# Patient Record
Sex: Female | Born: 1950
Health system: Southern US, Community
[De-identification: ages and names within clinical notes are randomized; demographics above are authoritative.]

## PROBLEM LIST (undated history)

## (undated) DIAGNOSIS — G709 Myoneural disorder, unspecified: Secondary | ICD-10-CM

## (undated) DIAGNOSIS — I10 Essential (primary) hypertension: Secondary | ICD-10-CM

## (undated) DIAGNOSIS — M797 Fibromyalgia: Secondary | ICD-10-CM

## (undated) DIAGNOSIS — N1832 Chronic kidney disease, stage 3b: Secondary | ICD-10-CM

## (undated) DIAGNOSIS — G4730 Sleep apnea, unspecified: Secondary | ICD-10-CM

## (undated) DIAGNOSIS — M1990 Unspecified osteoarthritis, unspecified site: Secondary | ICD-10-CM

## (undated) DIAGNOSIS — IMO0001 Reserved for inherently not codable concepts without codable children: Secondary | ICD-10-CM

## (undated) DIAGNOSIS — I2510 Atherosclerotic heart disease of native coronary artery without angina pectoris: Secondary | ICD-10-CM

## (undated) DIAGNOSIS — E119 Type 2 diabetes mellitus without complications: Secondary | ICD-10-CM

## (undated) DIAGNOSIS — Z531 Procedure and treatment not carried out because of patient's decision for reasons of belief and group pressure: Secondary | ICD-10-CM

## (undated) DIAGNOSIS — T8859XA Other complications of anesthesia, initial encounter: Secondary | ICD-10-CM

## (undated) DIAGNOSIS — I739 Peripheral vascular disease, unspecified: Secondary | ICD-10-CM

## (undated) DIAGNOSIS — E785 Hyperlipidemia, unspecified: Secondary | ICD-10-CM

## (undated) HISTORY — PX: ABLATION: SHX5711

## (undated) HISTORY — DX: Essential (primary) hypertension: I10

## (undated) HISTORY — DX: Hyperlipidemia, unspecified: E78.5

## (undated) HISTORY — DX: Unspecified osteoarthritis, unspecified site: M19.90

## (undated) HISTORY — DX: Type 2 diabetes mellitus without complications: E11.9

## (undated) HISTORY — DX: Fibromyalgia: M79.7

## (undated) HISTORY — PX: ROTATOR CUFF REPAIR: SHX139

## (undated) HISTORY — PX: HYSTEROTOMY: SHX1776

## (undated) HISTORY — PX: CAROTID STENT: SHX1301

## (undated) HISTORY — PX: CHOLECYSTECTOMY: SHX55

---

## 2020-01-28 DIAGNOSIS — E1159 Type 2 diabetes mellitus with other circulatory complications: Secondary | ICD-10-CM | POA: Diagnosis not present

## 2020-01-28 DIAGNOSIS — I2510 Atherosclerotic heart disease of native coronary artery without angina pectoris: Secondary | ICD-10-CM | POA: Diagnosis not present

## 2020-01-28 DIAGNOSIS — E7800 Pure hypercholesterolemia, unspecified: Secondary | ICD-10-CM | POA: Diagnosis not present

## 2020-01-28 DIAGNOSIS — G2581 Restless legs syndrome: Secondary | ICD-10-CM | POA: Diagnosis not present

## 2020-04-11 DIAGNOSIS — H0100A Unspecified blepharitis right eye, upper and lower eyelids: Secondary | ICD-10-CM | POA: Diagnosis not present

## 2020-04-11 DIAGNOSIS — H0100B Unspecified blepharitis left eye, upper and lower eyelids: Secondary | ICD-10-CM | POA: Diagnosis not present

## 2020-04-14 ENCOUNTER — Ambulatory Visit: Payer: Self-pay | Admitting: Internal Medicine

## 2020-04-19 DIAGNOSIS — H0100A Unspecified blepharitis right eye, upper and lower eyelids: Secondary | ICD-10-CM | POA: Diagnosis not present

## 2020-04-19 DIAGNOSIS — H0100B Unspecified blepharitis left eye, upper and lower eyelids: Secondary | ICD-10-CM | POA: Diagnosis not present

## 2020-04-20 DIAGNOSIS — I739 Peripheral vascular disease, unspecified: Secondary | ICD-10-CM | POA: Diagnosis not present

## 2020-04-20 DIAGNOSIS — M21962 Unspecified acquired deformity of left lower leg: Secondary | ICD-10-CM | POA: Diagnosis not present

## 2020-04-20 DIAGNOSIS — L602 Onychogryphosis: Secondary | ICD-10-CM | POA: Diagnosis not present

## 2020-04-20 DIAGNOSIS — M21961 Unspecified acquired deformity of right lower leg: Secondary | ICD-10-CM | POA: Diagnosis not present

## 2020-04-20 DIAGNOSIS — E1351 Other specified diabetes mellitus with diabetic peripheral angiopathy without gangrene: Secondary | ICD-10-CM | POA: Diagnosis not present

## 2020-04-20 DIAGNOSIS — M205X1 Other deformities of toe(s) (acquired), right foot: Secondary | ICD-10-CM | POA: Diagnosis not present

## 2020-04-20 DIAGNOSIS — L84 Corns and callosities: Secondary | ICD-10-CM | POA: Diagnosis not present

## 2020-04-26 DIAGNOSIS — I739 Peripheral vascular disease, unspecified: Secondary | ICD-10-CM | POA: Diagnosis not present

## 2020-05-26 DIAGNOSIS — I739 Peripheral vascular disease, unspecified: Secondary | ICD-10-CM | POA: Diagnosis not present

## 2020-06-02 DIAGNOSIS — E119 Type 2 diabetes mellitus without complications: Secondary | ICD-10-CM | POA: Diagnosis not present

## 2020-06-02 DIAGNOSIS — L918 Other hypertrophic disorders of the skin: Secondary | ICD-10-CM | POA: Diagnosis not present

## 2020-06-02 DIAGNOSIS — Z794 Long term (current) use of insulin: Secondary | ICD-10-CM | POA: Diagnosis not present

## 2020-06-02 DIAGNOSIS — H25813 Combined forms of age-related cataract, bilateral: Secondary | ICD-10-CM | POA: Diagnosis not present

## 2020-06-07 DIAGNOSIS — M21962 Unspecified acquired deformity of left lower leg: Secondary | ICD-10-CM | POA: Diagnosis not present

## 2020-06-07 DIAGNOSIS — I739 Peripheral vascular disease, unspecified: Secondary | ICD-10-CM | POA: Diagnosis not present

## 2020-06-07 DIAGNOSIS — M21961 Unspecified acquired deformity of right lower leg: Secondary | ICD-10-CM | POA: Diagnosis not present

## 2020-06-07 DIAGNOSIS — E1351 Other specified diabetes mellitus with diabetic peripheral angiopathy without gangrene: Secondary | ICD-10-CM | POA: Diagnosis not present

## 2020-06-17 DIAGNOSIS — E1159 Type 2 diabetes mellitus with other circulatory complications: Secondary | ICD-10-CM | POA: Diagnosis not present

## 2020-06-17 DIAGNOSIS — Z79899 Other long term (current) drug therapy: Secondary | ICD-10-CM | POA: Diagnosis not present

## 2020-06-17 DIAGNOSIS — E7800 Pure hypercholesterolemia, unspecified: Secondary | ICD-10-CM | POA: Diagnosis not present

## 2020-06-23 DIAGNOSIS — E1159 Type 2 diabetes mellitus with other circulatory complications: Secondary | ICD-10-CM | POA: Diagnosis not present

## 2020-06-23 DIAGNOSIS — E1140 Type 2 diabetes mellitus with diabetic neuropathy, unspecified: Secondary | ICD-10-CM | POA: Diagnosis not present

## 2020-06-23 DIAGNOSIS — E7800 Pure hypercholesterolemia, unspecified: Secondary | ICD-10-CM | POA: Diagnosis not present

## 2020-06-23 DIAGNOSIS — E1129 Type 2 diabetes mellitus with other diabetic kidney complication: Secondary | ICD-10-CM | POA: Diagnosis not present

## 2020-07-06 DIAGNOSIS — Z961 Presence of intraocular lens: Secondary | ICD-10-CM | POA: Diagnosis not present

## 2020-07-06 DIAGNOSIS — L918 Other hypertrophic disorders of the skin: Secondary | ICD-10-CM | POA: Diagnosis not present

## 2020-07-06 DIAGNOSIS — H2512 Age-related nuclear cataract, left eye: Secondary | ICD-10-CM | POA: Diagnosis not present

## 2020-07-06 DIAGNOSIS — H25811 Combined forms of age-related cataract, right eye: Secondary | ICD-10-CM | POA: Diagnosis not present

## 2020-07-06 DIAGNOSIS — H25012 Cortical age-related cataract, left eye: Secondary | ICD-10-CM | POA: Diagnosis not present

## 2020-07-06 DIAGNOSIS — Z48810 Encounter for surgical aftercare following surgery on the sense organs: Secondary | ICD-10-CM | POA: Diagnosis not present

## 2020-07-06 DIAGNOSIS — Z9842 Cataract extraction status, left eye: Secondary | ICD-10-CM | POA: Diagnosis not present

## 2020-07-13 DIAGNOSIS — H2511 Age-related nuclear cataract, right eye: Secondary | ICD-10-CM | POA: Diagnosis not present

## 2020-07-13 DIAGNOSIS — H25011 Cortical age-related cataract, right eye: Secondary | ICD-10-CM | POA: Diagnosis not present

## 2020-07-19 DIAGNOSIS — E1159 Type 2 diabetes mellitus with other circulatory complications: Secondary | ICD-10-CM | POA: Diagnosis not present

## 2020-07-24 NOTE — Progress Notes (Signed)
Patient referred by [name], DO for coronary artery disease  Subjective:   Suzanne Patton, female    DOB: Feb 19, 1951, 70 y.o.   MRN: [id number]   Chief Complaint  Patient presents with  . Coronary Artery Disease  . New Patient (Initial Visit)     HPI  70 y.o. Caucasian female with hypertension, hyperlipidemia, type 2 DM, CAD, SVT, bipolar disorder, OSA on CPAP, morbid obesity  Patient has known CAD with PCI in 2012, has h/o SVT with ablations, all performed in [place].   Patient moved to [place] in July 2020 to be with her daughter.  Most recently, she lived in [place], although she has previously lived in several different states.  Her last visit with her cardiologist in [place] was in September 2020.  Patient has had left-sided chest pressure symptoms with physical and emotional stress, relieved with stress, for long time.  This had been stable, but increased this past winter.  Patient has 2-3 episodes a week.  She is not on any short or long-acting nitroglycerin.  Patient reports episodes of lightheadedness followed by syncope, most recent episode 3 weeks ago.  She has several other episodes of aborted syncope.  She denies any chest pain or shortness of breath prior to her syncope episode.  She is reportedly undergone extensive neurological work-up in [place], which was reportedly unyielding.  Her diabetes remains uncontrolled.  She is now established care with Dr. [name], with whom she is working actively on management of her diabetes.   Past Medical History:  Diagnosis Date  . Diabetes mellitus without complication ([facility])   . Hyperlipidemia   . Hypertension      Past Surgical History:  Procedure Laterality Date  . ABLATION    . CAROTID STENT    . CHOLECYSTECTOMY    . HYSTEROTOMY    . ROTATOR CUFF REPAIR Right      Social History   Tobacco Use  Smoking Status Never Smoker  Smokeless Tobacco Never Used    Social History   Substance and Sexual  Activity  Alcohol Use Yes   Comment: rare     Family History  Problem Relation Age of Onset  . Atrial fibrillation Mother   . Kidney failure Mother   . Heart disease Father   . Heart attack Father   . Heart failure Father   . COPD Father      Current Outpatient Medications on File Prior to Visit  Medication Sig Dispense Refill  . amLODipine (NORVASC) 5 MG tablet Take 1 tablet by mouth daily.    Marland Kitchen aspirin 81 MG EC tablet Take 1 tablet by mouth daily.    Marland Kitchen atorvastatin (LIPITOR) 20 MG tablet Take 1 tablet by mouth daily.    . citalopram (CELEXA) 20 MG tablet Take 20 mg by mouth daily.    . clonazePAM (KLONOPIN) 0.5 MG tablet Take 0.75 mg by mouth at bedtime.    Marland Kitchen ezetimibe (ZETIA) 10 MG tablet Take 10 mg by mouth daily.    . fluticasone (FLONASE) 50 MCG/ACT nasal spray as needed.    Marland Kitchen ibuprofen (ADVIL) 600 MG tablet as needed.    . insulin lispro (HUMALOG) 100 UNIT/ML injection as needed.    . labetalol (NORMODYNE) 300 MG tablet Take 300 mg by mouth 2 (two) times daily.    Marland Kitchen lamoTRIgine (LAMICTAL) 200 MG tablet Take 200 mg by mouth 2 (two) times daily.    Marland Kitchen LEVEMIR 100 UNIT/ML injection 55 Units daily.    Marland Kitchen  ofloxacin (OCUFLOX) 0.3 % ophthalmic solution 1 drop 3 (three) times daily.    Marland Kitchen omeprazole (PRILOSEC) 20 MG capsule Take 1 capsule by mouth daily.    . prednisoLONE acetate (PRED FORTE) 1 % ophthalmic suspension 1 drop 3 (three) times daily.    . Semaglutide (RYBELSUS) 3 MG TABS See admin instructions.    [facility] Cancer ophthalmic ointment SMARTSIG:0.5 Strip(s) In Eye(s) 3 Times Daily     No current facility-administered medications on file prior to visit.    Cardiovascular and other pertinent studies:  EKG 07/25/2020: Sinus rhythm 66 bpm Poor R-wave progression, otherwise normal EKG   Recent labs: 06/17/2020: Glucose 259, BUN/Cr 19/1.3. EGFR 44. Na/K 141/5.2. AlKP 132. Rest of the CMP normal H/H 13/41. MCV 87. Platelets 250 HbA1C 8.0% Chol 268, TG 181, HDL 81, LDL  155 TSH 1.5 normal    Review of Systems  Cardiovascular: Positive for chest pain, dyspnea on exertion and syncope. Negative for leg swelling and palpitations.  Neurological: Positive for light-headedness.         Vitals:   07/25/20 1052 07/25/20 1057  BP: (!) 149/84 (!) 150/80  Pulse: 68 67  Resp: 17   Temp: 98 F (36.7 C)   SpO2: 96%    Orthostatic VS for the past 72 hrs (Last 3 readings):  Orthostatic BP Patient Position BP Location Cuff Size Orthostatic Pulse  07/25/20 1136 119/65 Standing Left Arm Large 71  07/25/20 1135 136/74 Sitting Left Arm Large 65  07/25/20 1132 152/75 Supine Left Arm Large 63     Body mass index is 24.94 kg/m. Filed Weights   07/25/20 1052  Weight: 164 lb (74.4 kg)     Objective:   Physical Exam Vitals and nursing note reviewed.  Constitutional:      General: She is not in acute distress. Neck:     Vascular: No JVD.  Cardiovascular:     Rate and Rhythm: Normal rate and regular rhythm.     Heart sounds: Normal heart sounds. No murmur heard.   Pulmonary:     Effort: Pulmonary effort is normal.     Breath sounds: Normal breath sounds. No wheezing or rales.  Musculoskeletal:     Right lower leg: No edema.     Left lower leg: No edema.         Assessment & Recommendations:   70 y.o. Caucasian female with hypertension, hyperlipidemia, type 2 DM, CAD, SVT, bipolar disorder, OSA on CPAP, morbid obesity  Coronary artery disease with stable angina: Recent increase in angina symptoms. She is currently on labetalol 300 mg twice daily, and amlodipine 5 mg daily.  Her GFR is 44. I am reluctant to increase amlodipine dose given her significant orthostatic drop in blood pressure, and episodes of syncope. I am also reluctant to start her on Ranexa given her GFR of 44. Unable to use long-acting nitroglycerin given episodes of lightheadedness and syncope. At this time, I recommend using sublingual nitroglycerin for as needed use.   I  will also change amlodipine 5 mg daily to diltiazem 140 mg daily. I will reduce her labetalol to half tablet of 300 mg twice daily, and add losartan 25 mg instead. She will check BMP in 1 week with her PCP. I will also check exercise nuclear stress test and echocardiogram. I will obtain her most recent cardiology records from [place].  Syncope: I suspect this is most likely related to orthostatic hypotension. Recommend 2-week cardiac telemetry. Avoid driving for 6 months since last episode  of syncope.  Further recommendations after above testing.  Thank you for referring the patient to [place]. Please feel free to contact with any questions.   Nigel Mormon, MD Pager: [phone] Office: [phone]

## 2020-07-25 ENCOUNTER — Encounter: Payer: Self-pay | Admitting: Cardiology

## 2020-07-25 ENCOUNTER — Other Ambulatory Visit: Payer: Self-pay

## 2020-07-25 ENCOUNTER — Ambulatory Visit: Payer: Medicare Other | Admitting: Cardiology

## 2020-07-25 ENCOUNTER — Other Ambulatory Visit: Payer: Medicare Other

## 2020-07-25 VITALS — BP 150/80 | HR 67 | Temp 98.0°F | Resp 17 | Ht 68.0 in | Wt 164.0 lb

## 2020-07-25 DIAGNOSIS — E782 Mixed hyperlipidemia: Secondary | ICD-10-CM | POA: Insufficient documentation

## 2020-07-25 DIAGNOSIS — I10 Essential (primary) hypertension: Secondary | ICD-10-CM

## 2020-07-25 DIAGNOSIS — R55 Syncope and collapse: Secondary | ICD-10-CM

## 2020-07-25 DIAGNOSIS — I25118 Atherosclerotic heart disease of native coronary artery with other forms of angina pectoris: Secondary | ICD-10-CM

## 2020-07-25 DIAGNOSIS — I2510 Atherosclerotic heart disease of native coronary artery without angina pectoris: Secondary | ICD-10-CM | POA: Insufficient documentation

## 2020-07-25 MED ORDER — ROSUVASTATIN CALCIUM 20 MG PO TABS: 20.0000 mg | ORAL_TABLET | Freq: Every day | ORAL | 3 refills | Status: DC

## 2020-07-25 MED ORDER — NITROGLYCERIN 0.4 MG SL SUBL
0.4000 mg | SUBLINGUAL_TABLET | SUBLINGUAL | 3 refills | Status: AC | PRN
Start: 2020-07-25 — End: 2022-08-10

## 2020-07-25 MED ORDER — DILTIAZEM HCL ER COATED BEADS 120 MG PO CP24: 120.0000 mg | ORAL_CAPSULE | Freq: Every day | ORAL | 3 refills | Status: DC

## 2020-07-25 MED ORDER — LOSARTAN POTASSIUM 25 MG PO TABS: 25.0000 mg | ORAL_TABLET | Freq: Every day | ORAL | 3 refills | Status: DC

## 2020-07-26 ENCOUNTER — Other Ambulatory Visit: Payer: Self-pay

## 2020-07-26 DIAGNOSIS — R55 Syncope and collapse: Secondary | ICD-10-CM

## 2020-07-28 DIAGNOSIS — R55 Syncope and collapse: Secondary | ICD-10-CM | POA: Diagnosis not present

## 2020-07-29 ENCOUNTER — Telehealth: Payer: Self-pay

## 2020-07-29 NOTE — Telephone Encounter (Signed)
Called pt no answer, left a vm

## 2020-07-29 NOTE — Telephone Encounter (Signed)
Okay about the monitor. As for eye surgery, defer to her surgeon as to if there is anything she should hold. Nothing from a cardiac perspective.

## 2020-08-01 NOTE — Telephone Encounter (Signed)
Pt is aware.  

## 2020-08-11 ENCOUNTER — Other Ambulatory Visit: Payer: Medicare Other

## 2020-08-22 ENCOUNTER — Other Ambulatory Visit: Payer: Medicare Other

## 2020-08-24 DIAGNOSIS — T8522XA Displacement of intraocular lens, initial encounter: Secondary | ICD-10-CM | POA: Diagnosis not present

## 2020-08-24 DIAGNOSIS — H27121 Anterior dislocation of lens, right eye: Secondary | ICD-10-CM | POA: Diagnosis not present

## 2020-08-29 ENCOUNTER — Ambulatory Visit: Payer: Medicare Other | Admitting: Cardiology

## 2020-08-29 DIAGNOSIS — R55 Syncope and collapse: Secondary | ICD-10-CM | POA: Diagnosis not present

## 2020-08-30 ENCOUNTER — Other Ambulatory Visit: Payer: Medicare Other

## 2020-09-01 DIAGNOSIS — E1159 Type 2 diabetes mellitus with other circulatory complications: Secondary | ICD-10-CM | POA: Diagnosis not present

## 2020-09-01 DIAGNOSIS — E7800 Pure hypercholesterolemia, unspecified: Secondary | ICD-10-CM | POA: Diagnosis not present

## 2020-09-01 DIAGNOSIS — I2510 Atherosclerotic heart disease of native coronary artery without angina pectoris: Secondary | ICD-10-CM | POA: Diagnosis not present

## 2020-09-01 DIAGNOSIS — E1129 Type 2 diabetes mellitus with other diabetic kidney complication: Secondary | ICD-10-CM | POA: Diagnosis not present

## 2020-09-05 ENCOUNTER — Other Ambulatory Visit: Payer: Self-pay

## 2020-09-05 ENCOUNTER — Ambulatory Visit: Payer: Medicare Other

## 2020-09-05 DIAGNOSIS — I25118 Atherosclerotic heart disease of native coronary artery with other forms of angina pectoris: Secondary | ICD-10-CM

## 2020-09-22 ENCOUNTER — Other Ambulatory Visit: Payer: Self-pay

## 2020-09-22 ENCOUNTER — Encounter: Payer: Self-pay | Admitting: Cardiology

## 2020-09-22 ENCOUNTER — Ambulatory Visit: Payer: Medicare Other | Admitting: Cardiology

## 2020-09-22 VITALS — BP 147/71 | HR 82 | Temp 96.1°F | Resp 16 | Ht 68.0 in | Wt 264.0 lb

## 2020-09-22 DIAGNOSIS — I25118 Atherosclerotic heart disease of native coronary artery with other forms of angina pectoris: Secondary | ICD-10-CM | POA: Diagnosis not present

## 2020-09-22 DIAGNOSIS — I10 Essential (primary) hypertension: Secondary | ICD-10-CM | POA: Diagnosis not present

## 2020-09-22 DIAGNOSIS — E782 Mixed hyperlipidemia: Secondary | ICD-10-CM | POA: Diagnosis not present

## 2020-09-22 DIAGNOSIS — R55 Syncope and collapse: Secondary | ICD-10-CM

## 2020-09-22 NOTE — Progress Notes (Signed)
Patient referred by [name], DO for coronary artery disease  Subjective:   Suzanne Patton, female    DOB: October 25, 1950, 70 y.o.   MRN: [id number]   Chief Complaint  Patient presents with   Chest Pain     HPI  70 y.o. Caucasian female with hypertension, hyperlipidemia, type 2 DM, CAD, SVT, bipolar disorder, OSA on CPAP, morbid obesity  Patient contracted COVID in May and has had episodes of hypoxia and dyspnea since then. She states that her sats read in 80s on her pulse oximeter at home. When this happens, she merely uses her CPAP machine, but does not have supplemental oxygen. She has not seen pulmonology. Chest pain episodes occur couple times a week, improved SL NTG.  Reviewed recent test results with the patient, details below.   Initial consultation HPI 07/2020: Patient has known CAD with PCI in 2012, has h/o SVT with ablations, all performed in [place].   Patient moved to [place] in July 2020 to be with her daughter.  Most recently, she lived in [place], although she has previously lived in several different states.  Her last visit with her cardiologist in [place] was in September 2020.  Patient has had left-sided chest pressure symptoms with physical and emotional stress, relieved with stress, for long time.  This had been stable, but increased this past winter.  Patient has 2-3 episodes a week.  She is not on any short or long-acting nitroglycerin.  Patient reports episodes of lightheadedness followed by syncope, most recent episode 3 weeks ago.  She has several other episodes of aborted syncope.  She denies any chest pain or shortness of breath prior to her syncope episode.  She is reportedly undergone extensive neurological work-up in [place], which was reportedly unyielding.  Her diabetes remains uncontrolled.  She is now established care with Dr. [name], with whom she is working actively on management of her diabetes.   Current Outpatient Medications on File  Prior to Visit  Medication Sig Dispense Refill   albuterol (VENTOLIN HFA) 108 (90 Base) MCG/ACT inhaler SMARTSIG:1-2 Puff(s) By Mouth 4 Times Daily PRN     aspirin 81 MG EC tablet Take 1 tablet by mouth daily.     citalopram (CELEXA) 20 MG tablet Take 20 mg by mouth daily.     clonazePAM (KLONOPIN) 0.5 MG tablet Take 0.75 mg by mouth at bedtime.     diltiazem (CARDIZEM CD) 120 MG 24 hr capsule Take 1 capsule (120 mg total) by mouth daily. 30 capsule 3   ezetimibe (ZETIA) 10 MG tablet Take 10 mg by mouth daily.     fluticasone (FLONASE) 50 MCG/ACT nasal spray as needed.     ibuprofen (ADVIL) 600 MG tablet as needed.     insulin lispro (HUMALOG) 100 UNIT/ML injection as needed.     labetalol (NORMODYNE) 300 MG tablet Take 150 mg by mouth 2 (two) times daily.     lamoTRIgine (LAMICTAL) 200 MG tablet Take 200 mg by mouth 2 (two) times daily.     LEVEMIR 100 UNIT/ML injection 55 Units daily.     losartan (COZAAR) 25 MG tablet Take 1 tablet (25 mg total) by mouth daily. 30 tablet 3   nitroGLYCERIN (NITROSTAT) 0.4 MG SL tablet Place 1 tablet (0.4 mg total) under the tongue every 5 (five) minutes as needed for chest pain. 30 tablet 3   omeprazole (PRILOSEC) 20 MG capsule Take 1 capsule by mouth daily.     rosuvastatin (CRESTOR) 20 MG  tablet Take 1 tablet (20 mg total) by mouth daily. 90 tablet 3   Semaglutide 7 MG TABS See admin instructions. RYBELSUS     No current facility-administered medications on file prior to visit.    Cardiovascular and other pertinent studies:  Lexiscan Tetrofosmin stress test 09/05/2020: Lexiscan nuclear stress test performed using 1-day protocol. Patient reported 2/10 chest pain, dyspnea, and dizziness. SPECT images show small sized, mild intensity, reversible perfusion defect in basal anteroseptal myocardium. Stress LVEF is calculated 49%, but visually appears normal. Low risk study.  Echocardiogram 09/05/2020:  Left ventricle cavity is normal in size. Moderate  concentric hypertrophy  of the left ventricle. Normal global wall motion. Normal LV systolic  function with EF 60%. Normal diastolic filling pattern.  Structurally normal trileaflet aortic valve. No evidence of aortic  stenosis. Moderate (Grade II) aortic regurgitation.  Moderate (Grade II) mitral regurgitation.  Mild tricuspid regurgitation. Estimated pulmonary artery systolic pressure  26 mmHg.  Mild pulmonic regurgitation.  Mobile cardiac telemetry 13 days 07/25/2020 - 08/08/2020: Dominant rhythm: Sinus. HR 58-150 bpm. Avg HR 71 bpm. 37 episodes of SVT, fastest at 126 bpm for 9 beats, longest for 12 beats at 105 bpm. <1% isolated SVE, couplet/triplets. 1 episode of VT, at 150 bpm for 4 beats <1% isolated VE, no couplet/triplets. No atrial fibrillation/atrial flutter/high grade AV block, sinus pause >3sec noted. 34 patient triggered events, correlated with sinus rhythm without any arrhthymias.  EKG 07/25/2020: Sinus rhythm 66 bpm Poor R-wave progression, otherwise normal EKG   Recent labs: 06/17/2020: Glucose 259, BUN/Cr 19/1.3. EGFR 44. Na/K 141/5.2. AlKP 132. Rest of the CMP normal H/H 13/41. MCV 87. Platelets 250 HbA1C 8.0% Chol 268, TG 181, HDL 81, LDL 155 TSH 1.5 normal    Review of Systems  Cardiovascular:  Positive for chest pain and dyspnea on exertion. Negative for leg swelling, palpitations and syncope (No recurrence).  Neurological:  Positive for light-headedness.        Vitals:   09/22/20 1503  BP: (!) 147/71  Pulse: 82  Resp: 16  Temp: (!) 96.1 F (35.6 C)  SpO2: 93%     Body mass index is 40.14 kg/m. Filed Weights   09/22/20 1503  Weight: 264 lb (119.7 kg)     Objective:   Physical Exam Vitals and nursing note reviewed.  Constitutional:      General: She is not in acute distress. Neck:     Vascular: No JVD.  Cardiovascular:     Rate and Rhythm: Normal rate and regular rhythm.     Heart sounds: Normal heart sounds. No murmur  heard. Pulmonary:     Effort: Pulmonary effort is normal.     Breath sounds: Normal breath sounds. No wheezing or rales.  Musculoskeletal:     Right lower leg: No edema.     Left lower leg: No edema.  Neurological:     Mental Status: She is alert.        Assessment & Recommendations:   70 y.o. Caucasian female with hypertension, hyperlipidemia, type 2 DM, CAD, SVT, bipolar disorder, OSA on CPAP, morbid obesity  Coronary artery disease with stable angina: No ischemia on stress testing (08/2020) Episodes of angina controlled with medical therapy.  Continue Aspirin 81 mg, Crestor 20 mg, diltiazem 120 mg, labetalol 150 mg bid, SL NTG prn Recommend evaluation of hypoxia in the meantime. Consider pulmonology referral.   Syncope: I suspect this is most likely related to orthostatic hypotension. No significant arrhythmia on cardiac telemetry.  F/u  in 6 weeks    Nigel Mormon, MD Pager: [phone] Office: [phone]

## 2020-10-21 ENCOUNTER — Other Ambulatory Visit: Payer: Self-pay | Admitting: Cardiology

## 2020-10-21 DIAGNOSIS — I10 Essential (primary) hypertension: Secondary | ICD-10-CM

## 2020-10-24 DIAGNOSIS — E7800 Pure hypercholesterolemia, unspecified: Secondary | ICD-10-CM | POA: Diagnosis not present

## 2020-10-24 DIAGNOSIS — I2510 Atherosclerotic heart disease of native coronary artery without angina pectoris: Secondary | ICD-10-CM | POA: Diagnosis not present

## 2020-10-24 DIAGNOSIS — E1129 Type 2 diabetes mellitus with other diabetic kidney complication: Secondary | ICD-10-CM | POA: Diagnosis not present

## 2020-10-27 DIAGNOSIS — E7800 Pure hypercholesterolemia, unspecified: Secondary | ICD-10-CM | POA: Diagnosis not present

## 2020-10-27 DIAGNOSIS — J342 Deviated nasal septum: Secondary | ICD-10-CM | POA: Diagnosis not present

## 2020-10-27 DIAGNOSIS — E1129 Type 2 diabetes mellitus with other diabetic kidney complication: Secondary | ICD-10-CM | POA: Diagnosis not present

## 2020-10-27 DIAGNOSIS — I2510 Atherosclerotic heart disease of native coronary artery without angina pectoris: Secondary | ICD-10-CM | POA: Diagnosis not present

## 2020-11-07 ENCOUNTER — Ambulatory Visit: Payer: Medicare Other | Admitting: Cardiology

## 2020-11-08 ENCOUNTER — Emergency Department (HOSPITAL_BASED_OUTPATIENT_CLINIC_OR_DEPARTMENT_OTHER)
Admission: EM | Admit: 2020-11-08 | Discharge: 2020-11-08 | Disposition: A | Discharge status: Home / Self Care | Payer: Medicare Other | Attending: Emergency Medicine | Admitting: Emergency Medicine

## 2020-11-08 ENCOUNTER — Other Ambulatory Visit: Payer: Self-pay

## 2020-11-08 ENCOUNTER — Emergency Department (HOSPITAL_BASED_OUTPATIENT_CLINIC_OR_DEPARTMENT_OTHER): Payer: Medicare Other | Admitting: Radiology

## 2020-11-08 ENCOUNTER — Encounter (HOSPITAL_BASED_OUTPATIENT_CLINIC_OR_DEPARTMENT_OTHER): Payer: Self-pay | Admitting: Emergency Medicine

## 2020-11-08 DIAGNOSIS — E1169 Type 2 diabetes mellitus with other specified complication: Secondary | ICD-10-CM | POA: Insufficient documentation

## 2020-11-08 DIAGNOSIS — I2510 Atherosclerotic heart disease of native coronary artery without angina pectoris: Secondary | ICD-10-CM | POA: Insufficient documentation

## 2020-11-08 DIAGNOSIS — E785 Hyperlipidemia, unspecified: Secondary | ICD-10-CM | POA: Insufficient documentation

## 2020-11-08 DIAGNOSIS — W010XXA Fall on same level from slipping, tripping and stumbling without subsequent striking against object, initial encounter: Secondary | ICD-10-CM | POA: Insufficient documentation

## 2020-11-08 DIAGNOSIS — S4992XA Unspecified injury of left shoulder and upper arm, initial encounter: Secondary | ICD-10-CM | POA: Diagnosis not present

## 2020-11-08 DIAGNOSIS — Z7982 Long term (current) use of aspirin: Secondary | ICD-10-CM | POA: Insufficient documentation

## 2020-11-08 DIAGNOSIS — Z794 Long term (current) use of insulin: Secondary | ICD-10-CM | POA: Insufficient documentation

## 2020-11-08 DIAGNOSIS — I10 Essential (primary) hypertension: Secondary | ICD-10-CM | POA: Insufficient documentation

## 2020-11-08 DIAGNOSIS — M25512 Pain in left shoulder: Secondary | ICD-10-CM

## 2020-11-08 NOTE — ED Provider Notes (Signed)
[facility] EMERGENCY DEPT Provider Note   CSN: [id number] Arrival date & time: 11/08/20  1320     History Chief Complaint  Patient presents with   Shoulder Injury    Suzanne Patton is a 70 y.o. female.  Patient presents chief complaint of left shoulder pain.  She states that she fell on her left shoulder about a week ago and has had some residual pain in the left shoulder.  Then about 3 days ago she was on her hands and knees try to get something off of the couch using her left arm when it slipped and she fell onto her left elbow causing sharp pain to radiate into her left shoulder.  She took Tylenol and Motrin at home without improvement and presents to the ER.  She describes the pain as worse when she moves the elbow a certain way.  Sharp and otherwise severe.  Denies fevers or cough or vomiting or diarrhea.  Denies headache or neck pain.      Past Medical History:  Diagnosis Date   Diabetes mellitus without complication ([facility])    Hyperlipidemia    Hypertension     Patient Active Problem List   Diagnosis Date Noted   Mixed hyperlipidemia 07/25/2020   Essential hypertension 07/25/2020   Coronary artery disease of native artery of native heart with stable angina pectoris ([facility]) 07/25/2020   Syncope and collapse 07/25/2020    Past Surgical History:  Procedure Laterality Date   ABLATION     CAROTID STENT     CHOLECYSTECTOMY     HYSTEROTOMY     ROTATOR CUFF REPAIR Right      OB History   No obstetric history on file.     Family History  Problem Relation Age of Onset   Atrial fibrillation Mother    Kidney failure Mother    Heart disease Father    Heart attack Father    Heart failure Father    COPD Father     Social History   Tobacco Use   Smoking status: Never   Smokeless tobacco: Never  Vaping Use   Vaping Use: Never used  Substance Use Topics   Alcohol use: Yes    Comment: rare   Drug use: Never    Home Medications Prior to Admission  medications   Medication Sig Start Date End Date Taking? Authorizing Provider  aspirin 81 MG EC tablet Take 1 tablet by mouth daily.   Yes [provider]  citalopram (CELEXA) 20 MG tablet Take 20 mg by mouth daily. 03/01/20  Yes [provider]  clonazePAM (KLONOPIN) 0.5 MG tablet Take 0.75 mg by mouth at bedtime. 04/30/20  Yes [provider]  diltiazem (CARDIZEM CD) 120 MG 24 hr capsule Take 1 capsule (120 mg total) by mouth daily. 07/25/20 11/08/20 Yes Patwardhan, Manish J, MD  ezetimibe (ZETIA) 10 MG tablet Take 10 mg by mouth daily. 06/23/20  Yes [provider]  fluticasone (FLONASE) 50 MCG/ACT nasal spray as needed.   Yes [provider]  ibuprofen (ADVIL) 600 MG tablet as needed.   Yes [provider]  insulin lispro (HUMALOG) 100 UNIT/ML injection as needed. 01/16/20  Yes [provider]  labetalol (NORMODYNE) 300 MG tablet Take 150 mg by mouth 2 (two) times daily. 05/31/20  Yes [provider]  lamoTRIgine (LAMICTAL) 200 MG tablet Take 200 mg by mouth 2 (two) times daily. 06/27/20  Yes [provider]  LEVEMIR 100 UNIT/ML injection 55 Units 2 (two)  times daily. 04/12/20  Yes [provider]  losartan (COZAAR) 25 MG tablet TAKE 1 TABLET (25 MG TOTAL) BY MOUTH DAILY. 10/21/20 01/19/21 Yes Patwardhan, Manish J, MD  omeprazole (PRILOSEC) 20 MG capsule Take 1 capsule by mouth daily. 06/23/20  Yes [provider]  rosuvastatin (CRESTOR) 20 MG tablet Take 1 tablet (20 mg total) by mouth daily. 07/25/20 11/08/20 Yes Patwardhan, Manish J, MD  Semaglutide 7 MG TABS Take 3 mg by mouth in the morning. RYBELSUS 07/19/20  Yes [provider]  albuterol (VENTOLIN HFA) 108 (90 Base) MCG/ACT inhaler SMARTSIG:1-2 Puff(s) By Mouth 4 Times Daily PRN 08/16/20   [provider]  nitroGLYCERIN (NITROSTAT) 0.4 MG SL tablet Place 1 tablet (0.4 mg total) under the tongue every 5 (five) minutes as needed for chest  pain. 07/25/20 10/23/20  Patwardhan, Anabel Bene, MD    Allergies    Dulaglutide and Meclizine  Review of Systems   Review of Systems  Constitutional:  Negative for fever.  HENT:  Negative for ear pain.   Eyes:  Negative for pain.  Respiratory:  Negative for cough.   Cardiovascular:  Negative for chest pain.  Gastrointestinal:  Negative for abdominal pain.  Genitourinary:  Negative for flank pain.  Musculoskeletal:  Negative for back pain.  Skin:  Negative for rash.  Neurological:  Negative for headaches.   Physical Exam Updated Vital Signs BP (!) 169/77 (BP Location: Left Arm)   Pulse 70   Temp 98.6 F (37 C)   Resp 18   Ht 5\' 8"  (1.727 m)   Wt 119.7 kg   SpO2 95%   BMI 40.14 kg/m   Physical Exam Constitutional:      General: She is not in acute distress.    Appearance: Normal appearance.  HENT:     Head: Normocephalic.     Nose: Nose normal.  Eyes:     Extraocular Movements: Extraocular movements intact.  Cardiovascular:     Rate and Rhythm: Normal rate.  Pulmonary:     Effort: Pulmonary effort is normal.  Musculoskeletal:     Cervical back: Normal range of motion.     Comments: On visual inspection of the left both extremity there are no gross deformities or significant swelling noted.  Patient has normal range of motion of the left wrist and left elbow.  Left shoulder has point tenderness diffusely across the [facility] joint and biceps insertion site.  She has diminished forward flexion to about 90 to 100 degrees before becomes too painful.  She has diminished external rotation to about 10 degrees before becomes too painful.  She has diminished internal rotation to about 50 degrees before becomes too painful.  Otherwise she is neurovascularly intact.  Neurological:     General: No focal deficit present.     Mental Status: She is alert. Mental status is at baseline.    ED Results / Procedures / Treatments   Labs (all labs ordered are listed, but only abnormal results are  displayed) Labs Reviewed - No data to display  EKG None  Radiology DG Shoulder Left  Result Date: 11/08/2020 CLINICAL DATA:  Left shoulder pain after fall. EXAM: LEFT SHOULDER - 2+ VIEW COMPARISON:  None. FINDINGS: No acute fracture or dislocation. Mild glenohumeral and moderate acromioclavicular degenerative changes. Soft tissues are unremarkable. IMPRESSION: 1. No acute osseous abnormality. Electronically Signed   By: 11/10/2020 M.D.   On: 11/08/2020 14:39    Procedures .Ortho Injury Treatment  Date/Time: 11/08/2020 3:57 PM  Performed by: Cheryll Cockayne, MD Authorized by: Cheryll Cockayne, MD  Post-procedure neurovascular assessment: post-procedure neurovascularly intact Comments: LUE shoulder immobilizer placed.     Medications Ordered in ED Medications - No data to display  ED Course  I have reviewed the triage vital signs and the nursing notes.  Pertinent labs & imaging results that were available during my care of the patient were reviewed by me and considered in my medical decision making (see chart for details).    MDM Rules/Calculators/A&P                           Doubt septic joint doubt acute fracture given x-rays are unremarkable.  Patient advised taking Tylenol at home and to cut back on Motrin.  This shoulder immobilizer placed for comfort.  Patient neurovascular intact after placement.  Advising outpatient follow-up with her doctor within the week.  Advising return for fevers worsening pain or any additional concerns.  Final Clinical Impression(s) / ED Diagnoses Final diagnoses:  Acute pain of left shoulder    Rx / DC Orders ED Discharge Orders     None        Cheryll Cockayne, MD 11/08/20 1558

## 2020-11-08 NOTE — Discharge Instructions (Addendum)
Take tylenol for pain. Follow up with your doctor in 1 week.  Return if your symptoms worsen, you have fevers or any other concerns.

## 2020-11-08 NOTE — ED Triage Notes (Signed)
Pt arrives from home and complains of pain to her left shoulder that started on Sunday. Pt stated she was on her hands and knees trying to get something from out under the couch. Pt states that her left arm slipped out from under her and she fell onto her shoulder.

## 2020-11-20 ENCOUNTER — Other Ambulatory Visit: Payer: Self-pay | Admitting: Cardiology

## 2020-11-20 DIAGNOSIS — I25118 Atherosclerotic heart disease of native coronary artery with other forms of angina pectoris: Secondary | ICD-10-CM

## 2020-11-22 DIAGNOSIS — M25512 Pain in left shoulder: Secondary | ICD-10-CM | POA: Diagnosis not present

## 2020-11-23 DIAGNOSIS — M9905 Segmental and somatic dysfunction of pelvic region: Secondary | ICD-10-CM | POA: Diagnosis not present

## 2020-11-23 DIAGNOSIS — M9903 Segmental and somatic dysfunction of lumbar region: Secondary | ICD-10-CM | POA: Diagnosis not present

## 2020-11-23 DIAGNOSIS — M5136 Other intervertebral disc degeneration, lumbar region: Secondary | ICD-10-CM | POA: Diagnosis not present

## 2020-11-23 DIAGNOSIS — M9904 Segmental and somatic dysfunction of sacral region: Secondary | ICD-10-CM | POA: Diagnosis not present

## 2020-11-24 ENCOUNTER — Encounter: Payer: Self-pay | Admitting: Cardiology

## 2020-11-24 ENCOUNTER — Other Ambulatory Visit: Payer: Self-pay

## 2020-11-24 ENCOUNTER — Ambulatory Visit: Payer: Medicare Other | Admitting: Cardiology

## 2020-11-24 VITALS — BP 150/76 | HR 71 | Temp 98.0°F | Resp 16 | Ht 68.0 in | Wt 259.0 lb

## 2020-11-24 DIAGNOSIS — I25118 Atherosclerotic heart disease of native coronary artery with other forms of angina pectoris: Secondary | ICD-10-CM

## 2020-11-24 DIAGNOSIS — E782 Mixed hyperlipidemia: Secondary | ICD-10-CM

## 2020-11-24 DIAGNOSIS — G4733 Obstructive sleep apnea (adult) (pediatric): Secondary | ICD-10-CM | POA: Diagnosis not present

## 2020-11-24 DIAGNOSIS — I10 Essential (primary) hypertension: Secondary | ICD-10-CM | POA: Diagnosis not present

## 2020-11-24 DIAGNOSIS — R0982 Postnasal drip: Secondary | ICD-10-CM | POA: Diagnosis not present

## 2020-11-24 NOTE — Progress Notes (Signed)
Patient referred by Irena Reichmann, DO for coronary artery disease  Subjective:   Suzanne Patton, female    DOB: 07/25/1950, 70 y.o.   MRN: [id number]   Chief Complaint  Patient presents with  . Coronary artery disease of native artery of native heart wi  . Chest Pain  . Follow-up    6-8 week     HPI  70 y.o. Caucasian female with hypertension, hyperlipidemia, type 2 DM, CAD, SVT, bipolar disorder, OSA on CPAP, morbid obesity  Patient has not had any recurrent chest pain episodes.  She has episodes of anxiety, that improves with sitting her pet cat on her.  Blood pressure is elevated today.  Initial consultation HPI 07/2020: Patient has known CAD with PCI in 2012, has h/o SVT with ablations, all performed in [place].   Patient moved to [place] in July 2020 to be with her daughter.  Most recently, she lived in [place], although she has previously lived in several different states.  Her last visit with her cardiologist in [place] was in September 2020.  Patient has had left-sided chest pressure symptoms with physical and emotional stress, relieved with stress, for long time.  This had been stable, but increased this past winter.  Patient has 2-3 episodes a week.  She is not on any short or long-acting nitroglycerin.  Patient reports episodes of lightheadedness followed by syncope, most recent episode 3 weeks ago.  She has several other episodes of aborted syncope.  She denies any chest pain or shortness of breath prior to her syncope episode.  She is reportedly undergone extensive neurological work-up in [place], which was reportedly unyielding.  Her diabetes remains uncontrolled.  She is now established care with Dr. Irena Reichmann, with whom she is working actively on management of her diabetes.   Current Outpatient Medications on File Prior to Visit  Medication Sig Dispense Refill  . albuterol (VENTOLIN HFA) 108 (90 Base) MCG/ACT inhaler SMARTSIG:1-2 Puff(s) By Mouth 4 Times Daily  PRN    . aspirin 81 MG EC tablet Take 1 tablet by mouth daily.    . citalopram (CELEXA) 20 MG tablet Take 20 mg by mouth daily.    . clonazePAM (KLONOPIN) 0.5 MG tablet Take 0.75 mg by mouth at bedtime.    Marland Kitchen diltiazem (CARDIZEM CD) 120 MG 24 hr capsule TAKE 1 CAPSULE BY MOUTH EVERY DAY 90 capsule 1  . ezetimibe (ZETIA) 10 MG tablet Take 10 mg by mouth daily.    . fluticasone (FLONASE) 50 MCG/ACT nasal spray as needed.    Marland Kitchen ibuprofen (ADVIL) 600 MG tablet as needed.    . insulin lispro (HUMALOG) 100 UNIT/ML injection as needed.    . labetalol (NORMODYNE) 300 MG tablet Take 150 mg by mouth 2 (two) times daily.    Marland Kitchen lamoTRIgine (LAMICTAL) 200 MG tablet Take 200 mg by mouth 2 (two) times daily.    Marland Kitchen LEVEMIR 100 UNIT/ML injection 55 Units 2 (two) times daily.    Marland Kitchen losartan (COZAAR) 25 MG tablet TAKE 1 TABLET (25 MG TOTAL) BY MOUTH DAILY. 90 tablet 1  . nitroGLYCERIN (NITROSTAT) 0.4 MG SL tablet Place 1 tablet (0.4 mg total) under the tongue every 5 (five) minutes as needed for chest pain. 30 tablet 3  . omeprazole (PRILOSEC) 20 MG capsule Take 1 capsule by mouth daily.    . rosuvastatin (CRESTOR) 20 MG tablet Take 1 tablet (20 mg total) by mouth daily. 90 tablet 3  . Semaglutide 7 MG TABS Take  3 mg by mouth in the morning. RYBELSUS     No current facility-administered medications on file prior to visit.    Cardiovascular and other pertinent studies:  Lexiscan Tetrofosmin stress test 09/05/2020: Lexiscan nuclear stress test performed using 1-day protocol. Patient reported 2/10 chest pain, dyspnea, and dizziness. SPECT images show small sized, mild intensity, reversible perfusion defect in basal anteroseptal myocardium. Stress LVEF is calculated 49%, but visually appears normal. Low risk study.  Echocardiogram 09/05/2020:  Left ventricle cavity is normal in size. Moderate concentric hypertrophy  of the left ventricle. Normal global wall motion. Normal LV systolic  function with EF 60%. Normal  diastolic filling pattern.  Structurally normal trileaflet aortic valve. No evidence of aortic  stenosis. Moderate (Grade II) aortic regurgitation.  Moderate (Grade II) mitral regurgitation.  Mild tricuspid regurgitation. Estimated pulmonary artery systolic pressure  26 mmHg.  Mild pulmonic regurgitation.  Mobile cardiac telemetry 13 days 07/25/2020 - 08/08/2020: Dominant rhythm: Sinus. HR 58-150 bpm. Avg HR 71 bpm. 37 episodes of SVT, fastest at 126 bpm for 9 beats, longest for 12 beats at 105 bpm. <1% isolated SVE, couplet/triplets. 1 episode of VT, at 150 bpm for 4 beats <1% isolated VE, no couplet/triplets. No atrial fibrillation/atrial flutter/high grade AV block, sinus pause >3sec noted. 34 patient triggered events, correlated with sinus rhythm without any arrhthymias.  EKG 07/25/2020: Sinus rhythm 66 bpm Poor R-wave progression, otherwise normal EKG   Recent labs: 06/17/2020: Glucose 259, BUN/Cr 19/1.3. EGFR 44. Na/K 141/5.2. AlKP 132. Rest of the CMP normal H/H 13/41. MCV 87. Platelets 250 HbA1C 8.0% Chol 268, TG 181, HDL 81, LDL 155 TSH 1.5 normal    Review of Systems  Cardiovascular:  Positive for dyspnea on exertion. Negative for chest pain, leg swelling, palpitations and syncope (No recurrence).  Neurological:  Negative for light-headedness.        Vitals:   11/24/20 1150  BP: (!) 156/66  Pulse: 75  Resp: 16  Temp: 98 F (36.7 C)  SpO2: 96%     Body mass index is 39.38 kg/m. Filed Weights   11/24/20 1150  Weight: 259 lb (117.5 kg)     Objective:   Physical Exam Vitals and nursing note reviewed.  Constitutional:      General: She is not in acute distress. Neck:     Vascular: No JVD.  Cardiovascular:     Rate and Rhythm: Normal rate and regular rhythm.     Heart sounds: Normal heart sounds. No murmur heard. Pulmonary:     Effort: Pulmonary effort is normal.     Breath sounds: Normal breath sounds. No wheezing or rales.  Musculoskeletal:      Right lower leg: No edema.     Left lower leg: No edema.  Neurological:     Mental Status: She is alert.        Assessment & Recommendations:   70 y.o. Caucasian female with hypertension, hyperlipidemia, type 2 DM, CAD, SVT, bipolar disorder, OSA on CPAP, morbid obesity  Coronary artery disease with stable angina: No ischemia on stress testing (08/2020) Episodes of angina controlled with medical therapy.  Continue Aspirin 81 mg, Crestor 20 mg, diltiazem 120 mg, labetalol 150 mg bid, SL NTG prn  Syncope: I suspect this is most likely related to orthostatic hypotension. No significant arrhythmia on cardiac telemetry. No recurrence of syncope  Hypertension: Uncontrolled.  Increase labetalol to 300 mg twice daily.  F/u in 4 weeks w/ Lawerance Cruel, PA    Suzanne Marquard Esther Hardy,  MD Pager: [phone] Office: [phone] 

## 2020-11-30 DIAGNOSIS — M9905 Segmental and somatic dysfunction of pelvic region: Secondary | ICD-10-CM | POA: Diagnosis not present

## 2020-11-30 DIAGNOSIS — M9903 Segmental and somatic dysfunction of lumbar region: Secondary | ICD-10-CM | POA: Diagnosis not present

## 2020-11-30 DIAGNOSIS — M5136 Other intervertebral disc degeneration, lumbar region: Secondary | ICD-10-CM | POA: Diagnosis not present

## 2020-11-30 DIAGNOSIS — M9904 Segmental and somatic dysfunction of sacral region: Secondary | ICD-10-CM | POA: Diagnosis not present

## 2020-12-12 ENCOUNTER — Other Ambulatory Visit: Payer: Self-pay | Admitting: Gastroenterology

## 2020-12-12 DIAGNOSIS — Z1211 Encounter for screening for malignant neoplasm of colon: Secondary | ICD-10-CM | POA: Diagnosis not present

## 2020-12-12 DIAGNOSIS — E119 Type 2 diabetes mellitus without complications: Secondary | ICD-10-CM | POA: Diagnosis not present

## 2020-12-12 DIAGNOSIS — I25119 Atherosclerotic heart disease of native coronary artery with unspecified angina pectoris: Secondary | ICD-10-CM | POA: Diagnosis not present

## 2020-12-12 DIAGNOSIS — K219 Gastro-esophageal reflux disease without esophagitis: Secondary | ICD-10-CM | POA: Diagnosis not present

## 2020-12-27 ENCOUNTER — Ambulatory Visit: Payer: Medicare Other | Admitting: Student

## 2020-12-27 ENCOUNTER — Other Ambulatory Visit: Payer: Self-pay

## 2020-12-27 ENCOUNTER — Encounter: Payer: Self-pay | Admitting: Student

## 2020-12-27 VITALS — BP 130/71 | HR 74 | Temp 96.6°F | Resp 17 | Ht 68.0 in | Wt 264.4 lb

## 2020-12-27 DIAGNOSIS — I10 Essential (primary) hypertension: Secondary | ICD-10-CM

## 2020-12-27 NOTE — Progress Notes (Signed)
Patient referred by [name], DO for coronary artery disease  Subjective:   Suzanne Patton, female    DOB: 12/11/1950, 70 y.o.   MRN: [id number]   Chief Complaint  Patient presents with   Follow-up    4 weeks     HPI  70 y.o. Caucasian female with hypertension, hyperlipidemia, type 2 DM, CAD (PCI in 2012), SVT with ablations (in [place]), bipolar disorder, OSA on CPAP, morbid obesity  Patient presents for 4-week follow-up.  Last office visit hypertension was uncontrolled, therefore increased labetalol to 300 mg p.o. twice daily.  She is tolerating increase labetalol without issue.  Denies dizziness, lightheadedness, syncope, near syncope.  She has had no recurrence of chest pain.  Pressure is now well controlled.  Initial consultation HPI 07/2020: Patient has known CAD with PCI in 2012, has h/o SVT with ablations, all performed in [place].   Patient moved to [place] in July 2020 to be with her daughter.  Most recently, she lived in [place], although she has previously lived in several different states.  Her last visit with her cardiologist in [place] was in September 2020.  Patient has had left-sided chest pressure symptoms with physical and emotional stress, relieved with stress, for long time.  This had been stable, but increased this past winter.  Patient has 2-3 episodes a week.  She is not on any short or long-acting nitroglycerin.  Patient reports episodes of lightheadedness followed by syncope, most recent episode 3 weeks ago.  She has several other episodes of aborted syncope.  She denies any chest pain or shortness of breath prior to her syncope episode.  She is reportedly undergone extensive neurological work-up in [place], which was reportedly unyielding.  Her diabetes remains uncontrolled.  She is now established care with Dr. [name], with whom she is working actively on management of her diabetes.   Current Outpatient Medications on File Prior to Visit  Medication  Sig Dispense Refill   albuterol (VENTOLIN HFA) 108 (90 Base) MCG/ACT inhaler SMARTSIG:1-2 Puff(s) By Mouth 4 Times Daily PRN     aspirin 81 MG EC tablet Take 1 tablet by mouth daily.     citalopram (CELEXA) 20 MG tablet Take 20 mg by mouth daily.     clonazePAM (KLONOPIN) 0.5 MG tablet Take 0.75 mg by mouth at bedtime.     diltiazem (CARDIZEM CD) 120 MG 24 hr capsule TAKE 1 CAPSULE BY MOUTH EVERY DAY 90 capsule 1   ezetimibe (ZETIA) 10 MG tablet Take 10 mg by mouth daily.     fluticasone (FLONASE) 50 MCG/ACT nasal spray as needed.     ibuprofen (ADVIL) 600 MG tablet as needed.     insulin lispro (HUMALOG) 100 UNIT/ML injection as needed.     labetalol (NORMODYNE) 300 MG tablet Take 1 tablet by mouth in the morning and at bedtime.     lamoTRIgine (LAMICTAL) 200 MG tablet Take 200 mg by mouth 2 (two) times daily.     LEVEMIR 100 UNIT/ML injection 55 Units 2 (two) times daily.     losartan (COZAAR) 25 MG tablet TAKE 1 TABLET (25 MG TOTAL) BY MOUTH DAILY. 90 tablet 1   nitroGLYCERIN (NITROSTAT) 0.4 MG SL tablet Place 1 tablet (0.4 mg total) under the tongue every 5 (five) minutes as needed for chest pain. 30 tablet 3   omeprazole (PRILOSEC) 20 MG capsule Take 1 capsule by mouth daily.     rosuvastatin (CRESTOR) 20 MG tablet Take 1 tablet (20 mg  total) by mouth daily. 90 tablet 3   Semaglutide 7 MG TABS Take 3 mg by mouth in the morning. RYBELSUS     No current facility-administered medications on file prior to visit.    Cardiovascular and other pertinent studies:  Lexiscan Tetrofosmin stress test 09/05/2020: Lexiscan nuclear stress test performed using 1-day protocol. Patient reported 2/10 chest pain, dyspnea, and dizziness. SPECT images show small sized, mild intensity, reversible perfusion defect in basal anteroseptal myocardium. Stress LVEF is calculated 49%, but visually appears normal. Low risk study.  Echocardiogram 09/05/2020:  Left ventricle cavity is normal in size. Moderate  concentric hypertrophy  of the left ventricle. Normal global wall motion. Normal LV systolic  function with EF 60%. Normal diastolic filling pattern.  Structurally normal trileaflet aortic valve. No evidence of aortic  stenosis. Moderate (Grade II) aortic regurgitation.  Moderate (Grade II) mitral regurgitation.  Mild tricuspid regurgitation. Estimated pulmonary artery systolic pressure  26 mmHg.  Mild pulmonic regurgitation.  Mobile cardiac telemetry 13 days 07/25/2020 - 08/08/2020: Dominant rhythm: Sinus. HR 58-150 bpm. Avg HR 71 bpm. 37 episodes of SVT, fastest at 126 bpm for 9 beats, longest for 12 beats at 105 bpm. <1% isolated SVE, couplet/triplets. 1 episode of VT, at 150 bpm for 4 beats <1% isolated VE, no couplet/triplets. No atrial fibrillation/atrial flutter/high grade AV block, sinus pause >3sec noted. 34 patient triggered events, correlated with sinus rhythm without any arrhthymias.  EKG 07/25/2020: Sinus rhythm 66 bpm Poor R-wave progression, otherwise normal EKG   Recent labs: 06/17/2020: Glucose 259, BUN/Cr 19/1.3. EGFR 44. Na/K 141/5.2. AlKP 132. Rest of the CMP normal H/H 13/41. MCV 87. Platelets 250 HbA1C 8.0% Chol 268, TG 181, HDL 81, LDL 155 TSH 1.5 normal    Review of Systems  Constitutional: Negative for malaise/fatigue and weight gain.  Cardiovascular:  Positive for dyspnea on exertion (stable). Negative for chest pain, claudication, leg swelling, near-syncope, orthopnea, palpitations, paroxysmal nocturnal dyspnea and syncope (No recurrence).  Respiratory:  Negative for shortness of breath.   Neurological:  Negative for dizziness and light-headedness.        Vitals:   12/27/20 1358  BP: 130/71  Pulse: 74  Resp: 17  Temp: (!) 96.6 F (35.9 C)  SpO2: 95%     Body mass index is 40.2 kg/m. Filed Weights   12/27/20 1358  Weight: 264 lb 6.4 oz (119.9 kg)     Objective:   Physical Exam Vitals and nursing note reviewed.  Constitutional:       General: She is not in acute distress. Neck:     Vascular: No JVD.  Cardiovascular:     Rate and Rhythm: Normal rate and regular rhythm.     Heart sounds: Normal heart sounds. No murmur heard. Pulmonary:     Effort: Pulmonary effort is normal.     Breath sounds: Normal breath sounds. No wheezing or rales.  Musculoskeletal:     Right lower leg: No edema.     Left lower leg: No edema.  Neurological:     Mental Status: She is alert.  Unchanged compared to previous visit      Assessment & Recommendations:   70 y.o. Caucasian female with hypertension, hyperlipidemia, type 2 DM, CAD, SVT, bipolar disorder, OSA on CPAP, morbid obesity  Hypertension: Now well controlled with increased dose of labetalol. Continue diltiazem, labetalol, losartan.  Syncope: History of syncope likely related to orthostatic hypotension.  No significant arrhythmia on cardiac monitor No recurrence of syncope or near syncope.  Follow-up  in 3 months, sooner if needed, CAD, HTN, HLD.    Nigel Mormon, MD Pager: [phone] Office: [phone]

## 2020-12-30 DIAGNOSIS — M25551 Pain in right hip: Secondary | ICD-10-CM | POA: Diagnosis not present

## 2020-12-30 DIAGNOSIS — M5459 Other low back pain: Secondary | ICD-10-CM | POA: Diagnosis not present

## 2021-01-02 DIAGNOSIS — M9904 Segmental and somatic dysfunction of sacral region: Secondary | ICD-10-CM | POA: Diagnosis not present

## 2021-01-02 DIAGNOSIS — M5136 Other intervertebral disc degeneration, lumbar region: Secondary | ICD-10-CM | POA: Diagnosis not present

## 2021-01-02 DIAGNOSIS — M9903 Segmental and somatic dysfunction of lumbar region: Secondary | ICD-10-CM | POA: Diagnosis not present

## 2021-01-02 DIAGNOSIS — M9905 Segmental and somatic dysfunction of pelvic region: Secondary | ICD-10-CM | POA: Diagnosis not present

## 2021-01-09 ENCOUNTER — Encounter (INDEPENDENT_AMBULATORY_CARE_PROVIDER_SITE_OTHER): Payer: Self-pay

## 2021-01-09 DIAGNOSIS — M9904 Segmental and somatic dysfunction of sacral region: Secondary | ICD-10-CM | POA: Diagnosis not present

## 2021-01-09 DIAGNOSIS — M9905 Segmental and somatic dysfunction of pelvic region: Secondary | ICD-10-CM | POA: Diagnosis not present

## 2021-01-09 DIAGNOSIS — M5136 Other intervertebral disc degeneration, lumbar region: Secondary | ICD-10-CM | POA: Diagnosis not present

## 2021-01-09 DIAGNOSIS — M9903 Segmental and somatic dysfunction of lumbar region: Secondary | ICD-10-CM | POA: Diagnosis not present

## 2021-01-09 DIAGNOSIS — Z961 Presence of intraocular lens: Secondary | ICD-10-CM | POA: Diagnosis not present

## 2021-01-12 DIAGNOSIS — M5451 Vertebrogenic low back pain: Secondary | ICD-10-CM | POA: Diagnosis not present

## 2021-01-18 DIAGNOSIS — M9904 Segmental and somatic dysfunction of sacral region: Secondary | ICD-10-CM | POA: Diagnosis not present

## 2021-01-18 DIAGNOSIS — M9905 Segmental and somatic dysfunction of pelvic region: Secondary | ICD-10-CM | POA: Diagnosis not present

## 2021-01-18 DIAGNOSIS — M5136 Other intervertebral disc degeneration, lumbar region: Secondary | ICD-10-CM | POA: Diagnosis not present

## 2021-01-18 DIAGNOSIS — M9903 Segmental and somatic dysfunction of lumbar region: Secondary | ICD-10-CM | POA: Diagnosis not present

## 2021-01-20 DIAGNOSIS — M5459 Other low back pain: Secondary | ICD-10-CM | POA: Diagnosis not present

## 2021-01-23 DIAGNOSIS — M9903 Segmental and somatic dysfunction of lumbar region: Secondary | ICD-10-CM | POA: Diagnosis not present

## 2021-01-23 DIAGNOSIS — M9905 Segmental and somatic dysfunction of pelvic region: Secondary | ICD-10-CM | POA: Diagnosis not present

## 2021-01-23 DIAGNOSIS — M9904 Segmental and somatic dysfunction of sacral region: Secondary | ICD-10-CM | POA: Diagnosis not present

## 2021-01-23 DIAGNOSIS — M5136 Other intervertebral disc degeneration, lumbar region: Secondary | ICD-10-CM | POA: Diagnosis not present

## 2021-01-25 ENCOUNTER — Encounter (HOSPITAL_COMMUNITY): Payer: Self-pay | Admitting: Gastroenterology

## 2021-01-25 NOTE — Progress Notes (Signed)
Attempted to obtain medical history via telephone, unable to reach at this time. I left a voicemail to return pre surgical testing department's phone call.  

## 2021-01-26 NOTE — Progress Notes (Signed)
Pt called pre surgical testing back - wants to cancel procedure because she states her blood sugars are still high and was told if they are still running high they would be unable to do procedure on 11/18 with Dr Elnoria Howard. I instructed her to call Dr. Rolla Etienne office and they can reschedule if they think necessary.

## 2021-02-01 ENCOUNTER — Ambulatory Visit (INDEPENDENT_AMBULATORY_CARE_PROVIDER_SITE_OTHER): Payer: Medicare Other | Admitting: Ophthalmology

## 2021-02-01 ENCOUNTER — Encounter (INDEPENDENT_AMBULATORY_CARE_PROVIDER_SITE_OTHER): Payer: Self-pay | Admitting: Ophthalmology

## 2021-02-01 ENCOUNTER — Other Ambulatory Visit: Payer: Self-pay

## 2021-02-01 DIAGNOSIS — E113493 Type 2 diabetes mellitus with severe nonproliferative diabetic retinopathy without macular edema, bilateral: Secondary | ICD-10-CM

## 2021-02-01 DIAGNOSIS — G4733 Obstructive sleep apnea (adult) (pediatric): Secondary | ICD-10-CM

## 2021-02-01 DIAGNOSIS — E113411 Type 2 diabetes mellitus with severe nonproliferative diabetic retinopathy with macular edema, right eye: Secondary | ICD-10-CM | POA: Insufficient documentation

## 2021-02-01 DIAGNOSIS — E113412 Type 2 diabetes mellitus with severe nonproliferative diabetic retinopathy with macular edema, left eye: Secondary | ICD-10-CM

## 2021-02-01 DIAGNOSIS — E113413 Type 2 diabetes mellitus with severe nonproliferative diabetic retinopathy with macular edema, bilateral: Secondary | ICD-10-CM

## 2021-02-01 NOTE — Assessment & Plan Note (Addendum)
Patient instructed to continue on CPAP to prevent nightly hypoxic damage to the macula and to the remainder of the CNS system.  I have urged patient to continue with excellent CPAP compliance

## 2021-02-01 NOTE — Assessment & Plan Note (Signed)
OD with perifoveal CME, confirmed by angiography with temporal aspect of the macula leaking

## 2021-02-01 NOTE — Progress Notes (Signed)
02/01/2021     CHIEF COMPLAINT Patient presents for  Chief Complaint  Patient presents with   Retina Evaluation      HISTORY OF PRESENT ILLNESS: Suzanne Patton is a 70 y.o. female who presents to the clinic today for:   HPI     Retina Evaluation   In both eyes.  Onset: 01/09/2021 since Dr. Nile Riggs last visit..  Associated Symptoms Negative for Flashes.  Context:  distance vision, mid-range vision, near vision, reading and driving.  Treatments tried include surgery.  Response to treatment was mild improvement (Improved at first, now currently the glasses are not working for patient.).        Comments   NP- Diabetic Eye Exam, No correction post cataract surgery- referred by Dr. Nile Riggs. Pt states "when I first got these glasses after surgery. As the weeks progressed, I am not seeing as good with these glasses. Dr. Nile Riggs office says they cannot get my prescription any better than 20/40, and my vision can vary due to Diabetes. It is weird my glasses worked for weeks after surgery and now they are not strong enough or something. I am just not seeing clear. I have been trying to get my blood sugar down, some days it is low hundreds, but sometimes it is up in the 2 and 3 hundreds. I have always had a prism but I don't think that is the problem- so I am here to get my eyes checked for Diabetes."  Onset of diabetes some 20 years previous type II      Last edited by Edmon Crape, MD on 02/01/2021 10:56 AM.      Referring physician: Irena Reichmann, DO [address] [place],  [place] [address]  HISTORICAL INFORMATION:   Selected notes from the MEDICAL RECORD NUMBER       CURRENT MEDICATIONS: No current outpatient medications on file. (Ophthalmic Drugs)   No current facility-administered medications for this visit. (Ophthalmic Drugs)   Current Outpatient Medications (Other)  Medication Sig   albuterol (VENTOLIN HFA) 108 (90 Base) MCG/ACT inhaler SMARTSIG:1-2 Puff(s)  By Mouth 4 Times Daily PRN   aspirin 81 MG EC tablet Take 1 tablet by mouth daily.   citalopram (CELEXA) 20 MG tablet Take 20 mg by mouth daily.   clonazePAM (KLONOPIN) 0.5 MG tablet Take 0.75 mg by mouth at bedtime.   diltiazem (CARDIZEM CD) 120 MG 24 hr capsule TAKE 1 CAPSULE BY MOUTH EVERY DAY   ezetimibe (ZETIA) 10 MG tablet Take 10 mg by mouth daily.   fluticasone (FLONASE) 50 MCG/ACT nasal spray as needed.   ibuprofen (ADVIL) 600 MG tablet as needed.   insulin lispro (HUMALOG) 100 UNIT/ML injection as needed.   labetalol (NORMODYNE) 300 MG tablet Take 1 tablet by mouth in the morning and at bedtime.   lamoTRIgine (LAMICTAL) 200 MG tablet Take 200 mg by mouth 2 (two) times daily.   LEVEMIR 100 UNIT/ML injection 55 Units 2 (two) times daily.   losartan (COZAAR) 25 MG tablet TAKE 1 TABLET (25 MG TOTAL) BY MOUTH DAILY.   nitroGLYCERIN (NITROSTAT) 0.4 MG SL tablet Place 1 tablet (0.4 mg total) under the tongue every 5 (five) minutes as needed for chest pain.   omeprazole (PRILOSEC) 20 MG capsule Take 1 capsule by mouth daily.   rosuvastatin (CRESTOR) 20 MG tablet Take 1 tablet (20 mg total) by mouth daily.   Semaglutide 7 MG TABS Take 3 mg by mouth in the morning. RYBELSUS   No  current facility-administered medications for this visit. (Other)      REVIEW OF SYSTEMS:    ALLERGIES Allergies  Allergen Reactions   Dulaglutide Shortness Of Breath   Meclizine Other (See Comments)   Prednisone     Other reaction(s): hyperglycemia   Quinolones     Other reaction(s): severe muscle aches    PAST MEDICAL HISTORY Past Medical History:  Diagnosis Date   Diabetes mellitus without complication (HCC)    Hyperlipidemia    Hypertension    Past Surgical History:  Procedure Laterality Date   ABLATION     CAROTID STENT     CHOLECYSTECTOMY     HYSTEROTOMY     ROTATOR CUFF REPAIR Right     FAMILY HISTORY Family History  Problem Relation Age of Onset   Atrial fibrillation Mother     Kidney failure Mother    Heart disease Father    Heart attack Father    Heart failure Father    COPD Father     SOCIAL HISTORY Social History   Tobacco Use   Smoking status: Never   Smokeless tobacco: Never  Vaping Use   Vaping Use: Never used  Substance Use Topics   Alcohol use: Yes    Comment: rare   Drug use: Never         OPHTHALMIC EXAM:  Base Eye Exam     Visual Acuity (ETDRS)       Right Left   Dist cc 20/30 20/40 -2   Dist ph cc  NI         Tonometry (Tonopen, 9:56 AM)       Right Left   Pressure 13 13         Pupils       Dark Light Shape React APD   Right 4 3 Irregular Brisk None   Left 4 3 Round Brisk None         Visual Fields (Counting fingers)       Left Right    Full Full         Extraocular Movement       Right Left    Full Full         Neuro/Psych     Oriented x3: Yes   Mood/Affect: Normal         Dilation     Both eyes: 1.0% Mydriacyl, 2.5% Phenylephrine @ 9:55 AM           Slit Lamp and Fundus Exam     External Exam       Right Left   External Normal Normal         Slit Lamp Exam       Right Left   Lids/Lashes Normal Normal   Conjunctiva/Sclera White and quiet White and quiet   Cornea Clear Clear   Anterior Chamber Deep and quiet Deep and quiet   Iris Round and reactive Round and reactive   Lens Centered posterior chamber intraocular lens, 1+ Posterior capsular opacification Centered posterior chamber intraocular lens   Anterior Vitreous Normal Normal         Fundus Exam       Right Left   Posterior Vitreous Normal Normal   Disc Normal Normal   C/D Ratio 0.45 0.5   Macula Microaneurysms Microaneurysms   Vessels NPDR- Moderate clinically NPDR- Moderate clinicially   Periphery Normal Normal            IMAGING AND PROCEDURES  Imaging and Procedures  for 02/01/21  OCT, Retina - OU - Both Eyes       Right Eye Quality was good. Scan locations included subfoveal. Central  Foveal Thickness: 274. Findings include abnormal foveal contour.   Left Eye Quality was good. Scan locations included subfoveal. Central Foveal Thickness: 374. Progression has no prior data. Findings include abnormal foveal contour.   Notes Minor but definite perifoveal CME OU     Color Fundus Photography Optos - OU - Both Eyes       Right Eye Progression has no prior data. Disc findings include normal observations. Macula : microaneurysms.   Left Eye Disc findings include normal observations. Macula : microaneurysms. Periphery : normal observations.   Notes Severe NPDR seen clinically             ASSESSMENT/PLAN:  OSA (obstructive sleep apnea) Patient instructed to continue on CPAP to prevent nightly hypoxic damage to the macula and to the remainder of the CNS system.  I have urged patient to continue with excellent CPAP compliance  Severe nonproliferative diabetic retinopathy of left eye, with macular edema, associated with type 2 diabetes mellitus (HCC) OS with minor perifoveal CME confirmed by angiography not likely the cause of vision loss in the left eye but nonetheless we will treat to prevent progression  Severe nonproliferative diabetic retinopathy of right eye, with macular edema, associated with type 2 diabetes mellitus (HCC) OD with perifoveal CME, confirmed by angiography with temporal aspect of the macula leaking     ICD-10-CM   1. Severe nonproliferative diabetic retinopathy of left eye, with macular edema, associated with type 2 diabetes mellitus (HCC)  E11.3412 OCT, Retina - OU - Both Eyes    2. Severe nonproliferative diabetic retinopathy of right eye, with macular edema, associated with type 2 diabetes mellitus (HCC)  E11.3411 OCT, Retina - OU - Both Eyes    3. Severe nonproliferative diabetic retinopathy of both eyes without macular edema associated with type 2 diabetes mellitus (HCC)  M07.6808 Color Fundus Photography Optos - OU - Both Eyes    4.  OSA (obstructive sleep apnea)  G47.33     5. Diabetic macular edema of both eyes with severe nonproliferative retinopathy associated with type 2 diabetes mellitus (HCC)  U11.0315       1.  OD, with perifoveal CSME confirmed by angiography we will commence with therapy next week with intravitreal Avastin OD  2.  OS also with minor diffuse macular edema we will commence with therapy as well in order to maximize visual potential the current retinas.  3.  I will say curiously there is diffuse atrophy in each eye which may not be inexplicable by current disease severity by diabetes OU  Ophthalmic Meds Ordered this visit:  No orders of the defined types were placed in this encounter.      Return in about 1 week (around 02/08/2021) for dilate, OD, AVASTIN OCT.  There are no Patient Instructions on file for this visit.   Explained the diagnoses, plan, and follow up with the patient and they expressed understanding.  Patient expressed understanding of the importance of proper follow up care.   Alford Highland Latif Nazareno M.D. Diseases & Surgery of the Retina and Vitreous [facility] 02/01/21     Abbreviations: M myopia (nearsighted); A astigmatism; H hyperopia (farsighted); P presbyopia; Mrx spectacle prescription;  CTL contact lenses; OD right eye; OS left eye; OU both eyes  XT exotropia; ET esotropia; PEK punctate epithelial keratitis; PEE punctate epithelial  erosions; DES dry eye syndrome; MGD meibomian gland dysfunction; ATs artificial tears; PFAT's preservative free artificial tears; NSC nuclear sclerotic cataract; PSC posterior subcapsular cataract; ERM epi-retinal membrane; PVD posterior vitreous detachment; RD retinal detachment; DM diabetes mellitus; DR diabetic retinopathy; NPDR non-proliferative diabetic retinopathy; PDR proliferative diabetic retinopathy; CSME clinically significant macular edema; DME diabetic macular edema; dbh dot blot hemorrhages; CWS cotton wool spot;  POAG primary open angle glaucoma; C/D cup-to-disc ratio; HVF humphrey visual field; GVF goldmann visual field; OCT optical coherence tomography; IOP intraocular pressure; BRVO Branch retinal vein occlusion; CRVO central retinal vein occlusion; CRAO central retinal artery occlusion; BRAO branch retinal artery occlusion; RT retinal tear; SB scleral buckle; PPV pars plana vitrectomy; VH Vitreous hemorrhage; PRP panretinal laser photocoagulation; IVK intravitreal kenalog; VMT vitreomacular traction; MH Macular hole;  NVD neovascularization of the disc; NVE neovascularization elsewhere; AREDS age related eye disease study; ARMD age related macular degeneration; POAG primary open angle glaucoma; EBMD epithelial/anterior basement membrane dystrophy; ACIOL anterior chamber intraocular lens; IOL intraocular lens; PCIOL posterior chamber intraocular lens; Phaco/IOL phacoemulsification with intraocular lens placement; PRK photorefractive keratectomy; LASIK laser assisted in situ keratomileusis; HTN hypertension; DM diabetes mellitus; COPD chronic obstructive pulmonary disease

## 2021-02-01 NOTE — Assessment & Plan Note (Signed)
OS with minor perifoveal CME confirmed by angiography not likely the cause of vision loss in the left eye but nonetheless we will treat to prevent progression

## 2021-02-03 ENCOUNTER — Encounter (HOSPITAL_COMMUNITY): Payer: Self-pay

## 2021-02-03 ENCOUNTER — Ambulatory Visit (HOSPITAL_COMMUNITY): Admit: 2021-02-03 | Payer: Medicare Other | Admitting: Gastroenterology

## 2021-02-03 SURGERY — COLONOSCOPY WITH PROPOFOL
Anesthesia: Monitor Anesthesia Care

## 2021-02-06 ENCOUNTER — Encounter (INDEPENDENT_AMBULATORY_CARE_PROVIDER_SITE_OTHER): Payer: Medicare Other | Admitting: Ophthalmology

## 2021-02-06 ENCOUNTER — Encounter (INDEPENDENT_AMBULATORY_CARE_PROVIDER_SITE_OTHER): Payer: Self-pay

## 2021-02-15 DIAGNOSIS — M9904 Segmental and somatic dysfunction of sacral region: Secondary | ICD-10-CM | POA: Diagnosis not present

## 2021-02-15 DIAGNOSIS — M9905 Segmental and somatic dysfunction of pelvic region: Secondary | ICD-10-CM | POA: Diagnosis not present

## 2021-02-15 DIAGNOSIS — M9903 Segmental and somatic dysfunction of lumbar region: Secondary | ICD-10-CM | POA: Diagnosis not present

## 2021-02-15 DIAGNOSIS — M5136 Other intervertebral disc degeneration, lumbar region: Secondary | ICD-10-CM | POA: Diagnosis not present

## 2021-02-23 DIAGNOSIS — Z79899 Other long term (current) drug therapy: Secondary | ICD-10-CM | POA: Diagnosis not present

## 2021-02-23 DIAGNOSIS — E7800 Pure hypercholesterolemia, unspecified: Secondary | ICD-10-CM | POA: Diagnosis not present

## 2021-02-23 DIAGNOSIS — E1129 Type 2 diabetes mellitus with other diabetic kidney complication: Secondary | ICD-10-CM | POA: Diagnosis not present

## 2021-03-03 DIAGNOSIS — E1159 Type 2 diabetes mellitus with other circulatory complications: Secondary | ICD-10-CM | POA: Diagnosis not present

## 2021-03-03 DIAGNOSIS — Z955 Presence of coronary angioplasty implant and graft: Secondary | ICD-10-CM | POA: Diagnosis not present

## 2021-03-03 DIAGNOSIS — E1129 Type 2 diabetes mellitus with other diabetic kidney complication: Secondary | ICD-10-CM | POA: Diagnosis not present

## 2021-03-03 DIAGNOSIS — Z23 Encounter for immunization: Secondary | ICD-10-CM | POA: Diagnosis not present

## 2021-03-03 DIAGNOSIS — Z0000 Encounter for general adult medical examination without abnormal findings: Secondary | ICD-10-CM | POA: Diagnosis not present

## 2021-03-03 DIAGNOSIS — E11319 Type 2 diabetes mellitus with unspecified diabetic retinopathy without macular edema: Secondary | ICD-10-CM | POA: Diagnosis not present

## 2021-03-14 DIAGNOSIS — R6889 Other general symptoms and signs: Secondary | ICD-10-CM | POA: Diagnosis not present

## 2021-03-21 ENCOUNTER — Other Ambulatory Visit: Payer: Self-pay

## 2021-03-21 ENCOUNTER — Ambulatory Visit (INDEPENDENT_AMBULATORY_CARE_PROVIDER_SITE_OTHER): Payer: Medicare Other | Admitting: Ophthalmology

## 2021-03-21 ENCOUNTER — Encounter (INDEPENDENT_AMBULATORY_CARE_PROVIDER_SITE_OTHER): Payer: Self-pay | Admitting: Ophthalmology

## 2021-03-21 DIAGNOSIS — G4733 Obstructive sleep apnea (adult) (pediatric): Secondary | ICD-10-CM | POA: Diagnosis not present

## 2021-03-21 DIAGNOSIS — E113411 Type 2 diabetes mellitus with severe nonproliferative diabetic retinopathy with macular edema, right eye: Secondary | ICD-10-CM

## 2021-03-21 MED ORDER — BEVACIZUMAB 2.5 MG/0.1ML IZ SOSY
2.5000 mg | PREFILLED_SYRINGE | INTRAVITREAL | Status: AC | PRN
  Administered 2021-03-21: 2.5 mg via INTRAVITREAL

## 2021-03-21 NOTE — Assessment & Plan Note (Signed)
Patient reports excellent compliance with sleep apnea therapy on CPAP.

## 2021-03-21 NOTE — Assessment & Plan Note (Signed)
We will treat with intravitreal Avastin today to prevent progression of diabetic retinopathy as well as treat the CSME present angiographically last visit.

## 2021-03-21 NOTE — Progress Notes (Signed)
03/21/2021     CHIEF COMPLAINT Patient presents for  Chief Complaint  Patient presents with   Retina Follow Up      HISTORY OF PRESENT ILLNESS: Suzanne Patton is a 71 y.o. female who presents to the clinic today for:   HPI     Retina Follow Up           Diagnosis: Diabetic Retinopathy   Laterality: right eye   Onset: 7 weeks ago   Severity: mild   Duration: 7 weeks         Comments   6 weeks and 6 days week dilate OD, Avastin OCT. Patient states vision is stable and unchanged since last visit. Denies any new floaters or FOL.  Pt states she passes out frequently. Pt states she had a fall and concussion each of the following dates: March 2020, June 2020, April 2021. Pt states the one in June was very bad.        Last edited by Nelva Nay on 03/21/2021  3:11 PM.      Referring physician: Irena Reichmann, DO [address] [place],  [place] [address]  HISTORICAL INFORMATION:   Selected notes from the MEDICAL RECORD NUMBER       CURRENT MEDICATIONS: No current outpatient medications on file. (Ophthalmic Drugs)   No current facility-administered medications for this visit. (Ophthalmic Drugs)   Current Outpatient Medications (Other)  Medication Sig   albuterol (VENTOLIN HFA) 108 (90 Base) MCG/ACT inhaler SMARTSIG:1-2 Puff(s) By Mouth 4 Times Daily PRN   aspirin 81 MG EC tablet Take 1 tablet by mouth daily.   citalopram (CELEXA) 20 MG tablet Take 20 mg by mouth daily.   clonazePAM (KLONOPIN) 0.5 MG tablet Take 0.75 mg by mouth at bedtime.   diltiazem (CARDIZEM CD) 120 MG 24 hr capsule TAKE 1 CAPSULE BY MOUTH EVERY DAY   ezetimibe (ZETIA) 10 MG tablet Take 10 mg by mouth daily.   fluticasone (FLONASE) 50 MCG/ACT nasal spray as needed.   ibuprofen (ADVIL) 600 MG tablet as needed.   insulin lispro (HUMALOG) 100 UNIT/ML injection as needed.   labetalol (NORMODYNE) 300 MG tablet Take 1 tablet by mouth in the morning and at bedtime.   lamoTRIgine  (LAMICTAL) 200 MG tablet Take 200 mg by mouth 2 (two) times daily.   LEVEMIR 100 UNIT/ML injection 55 Units 2 (two) times daily.   losartan (COZAAR) 25 MG tablet TAKE 1 TABLET (25 MG TOTAL) BY MOUTH DAILY.   nitroGLYCERIN (NITROSTAT) 0.4 MG SL tablet Place 1 tablet (0.4 mg total) under the tongue every 5 (five) minutes as needed for chest pain.   omeprazole (PRILOSEC) 20 MG capsule Take 1 capsule by mouth daily.   rosuvastatin (CRESTOR) 20 MG tablet Take 1 tablet (20 mg total) by mouth daily.   Semaglutide 7 MG TABS Take 3 mg by mouth in the morning. RYBELSUS   No current facility-administered medications for this visit. (Other)      REVIEW OF SYSTEMS: ROS   Negative for: Constitutional, Gastrointestinal, Neurological, Skin, Genitourinary, Musculoskeletal, HENT, Endocrine, Cardiovascular, Eyes, Respiratory, Psychiatric, Allergic/Imm, Heme/Lymph Last edited by Edmon Crape, MD on 03/21/2021  3:21 PM.       ALLERGIES Allergies  Allergen Reactions   Dulaglutide Shortness Of Breath   Meclizine Other (See Comments)   Prednisone     Other reaction(s): hyperglycemia   Quinolones     Other reaction(s): severe muscle aches    PAST MEDICAL HISTORY Past Medical History:  Diagnosis Date   Diabetes mellitus without complication (HCC)    Hyperlipidemia    Hypertension    Past Surgical History:  Procedure Laterality Date   ABLATION     CAROTID STENT     CHOLECYSTECTOMY     HYSTEROTOMY     ROTATOR CUFF REPAIR Right     FAMILY HISTORY Family History  Problem Relation Age of Onset   Atrial fibrillation Mother    Kidney failure Mother    Heart disease Father    Heart attack Father    Heart failure Father    COPD Father     SOCIAL HISTORY Social History   Tobacco Use   Smoking status: Never   Smokeless tobacco: Never  Vaping Use   Vaping Use: Never used  Substance Use Topics   Alcohol use: Yes    Comment: rare   Drug use: Never         OPHTHALMIC EXAM:  Base  Eye Exam     Visual Acuity (ETDRS)       Right Left   Dist cc 20/30 -1 20/40   Dist ph cc  20/25 -2    Correction: Glasses         Tonometry (Tonopen, 3:08 PM)       Right Left   Pressure 16 15         Pupils       Pupils Dark Light Shape APD   Right PERRL 4 3 Irregular None   Left PERRL 4 3 Round None         Extraocular Movement       Right Left    Full Full         Neuro/Psych     Oriented x3: Yes   Mood/Affect: Normal         Dilation     Both eyes: 1.0% Mydriacyl, 2.5% Phenylephrine @ 3:08 PM           Slit Lamp and Fundus Exam     External Exam       Right Left   External Normal Normal         Slit Lamp Exam       Right Left   Lids/Lashes Normal Normal   Conjunctiva/Sclera White and quiet White and quiet   Cornea Clear Clear   Anterior Chamber Deep and quiet Deep and quiet   Iris Round and reactive Round and reactive   Lens Centered posterior chamber intraocular lens, 1+ Posterior capsular opacification Centered posterior chamber intraocular lens   Anterior Vitreous Normal Normal         Fundus Exam       Right Left   Posterior Vitreous Normal    Disc Normal    C/D Ratio 0.45    Macula Microaneurysms    Vessels NPDR- Moderate clinically    Periphery Normal             IMAGING AND PROCEDURES  Imaging and Procedures for 03/21/21  Intravitreal Injection, Pharmacologic Agent - OD - Right Eye       Time Out 03/21/2021. 3:24 PM. Confirmed correct patient, procedure, site, and patient consented.   Anesthesia Topical anesthesia was used. Anesthetic medications included Lidocaine 4%.   Procedure Preparation included 5% betadine to ocular surface, 10% betadine to eyelids. A 30 gauge needle was used.   Injection: 2.5 mg bevacizumab 2.5 MG/0.1ML   Route: Intravitreal, Site: Right Eye   NDC: [id number], Lot: [id number]   Post-op Post  injection exam found visual acuity of at least counting fingers. The patient  tolerated the procedure well. There were no complications. The patient received written and verbal post procedure care education. Post injection medications included gentamicin.      OCT, Retina - OU - Both Eyes       Right Eye Quality was good. Scan locations included subfoveal. Central Foveal Thickness: 279. Progression has been stable. Findings include abnormal foveal contour.   Left Eye Quality was good. Scan locations included subfoveal. Central Foveal Thickness: 317. Progression has been stable. Findings include abnormal foveal contour.   Notes Minor but definite perifoveal CME OU             ASSESSMENT/PLAN:  OSA (obstructive sleep apnea) Patient reports excellent compliance with sleep apnea therapy on CPAP.  Severe nonproliferative diabetic retinopathy of right eye, with macular edema, associated with type 2 diabetes mellitus (HCC) We will treat with intravitreal Avastin today to prevent progression of diabetic retinopathy as well as treat the CSME present angiographically last visit.     ICD-10-CM   1. Severe nonproliferative diabetic retinopathy of right eye, with macular edema, associated with type 2 diabetes mellitus (HCC)  E11.3411 Intravitreal Injection, Pharmacologic Agent - OD - Right Eye    OCT, Retina - OU - Both Eyes    bevacizumab (AVASTIN) SOSY 2.5 mg    2. OSA (obstructive sleep apnea)  G47.33       1.  Severe nonproliferative diabetic retinopathy OU documented by fluorescein angiography with significant leakages on angiography OD, will treat today and follow-up in 10 weeks and dilate OU for evaluation with OCT  2.  3.  Ophthalmic Meds Ordered this visit:  Meds ordered this encounter  Medications   bevacizumab (AVASTIN) SOSY 2.5 mg       Return in about 10 weeks (around 05/30/2021) for DILATE OU, OCT.  There are no Patient Instructions on file for this visit.   Explained the diagnoses, plan, and follow up with the patient and they  expressed understanding.  Patient expressed understanding of the importance of proper follow up care.   Alford HighlandGary A. Shelah Heatley M.D. Diseases & Surgery of the Retina and Vitreous [facility] 03/21/21     Abbreviations: M myopia (nearsighted); A astigmatism; H hyperopia (farsighted); P presbyopia; Mrx spectacle prescription;  CTL contact lenses; OD right eye; OS left eye; OU both eyes  XT exotropia; ET esotropia; PEK punctate epithelial keratitis; PEE punctate epithelial erosions; DES dry eye syndrome; MGD meibomian gland dysfunction; ATs artificial tears; PFAT's preservative free artificial tears; NSC nuclear sclerotic cataract; PSC posterior subcapsular cataract; ERM epi-retinal membrane; PVD posterior vitreous detachment; RD retinal detachment; DM diabetes mellitus; DR diabetic retinopathy; NPDR non-proliferative diabetic retinopathy; PDR proliferative diabetic retinopathy; CSME clinically significant macular edema; DME diabetic macular edema; dbh dot blot hemorrhages; CWS cotton wool spot; POAG primary open angle glaucoma; C/D cup-to-disc ratio; HVF humphrey visual field; GVF goldmann visual field; OCT optical coherence tomography; IOP intraocular pressure; BRVO Branch retinal vein occlusion; CRVO central retinal vein occlusion; CRAO central retinal artery occlusion; BRAO branch retinal artery occlusion; RT retinal tear; SB scleral buckle; PPV pars plana vitrectomy; VH Vitreous hemorrhage; PRP panretinal laser photocoagulation; IVK intravitreal kenalog; VMT vitreomacular traction; MH Macular hole;  NVD neovascularization of the disc; NVE neovascularization elsewhere; AREDS age related eye disease study; ARMD age related macular degeneration; POAG primary open angle glaucoma; EBMD epithelial/anterior basement membrane dystrophy; ACIOL anterior chamber intraocular lens; IOL intraocular lens; PCIOL posterior chamber  intraocular lens; Phaco/IOL phacoemulsification with intraocular lens placement;  PRK photorefractive keratectomy; LASIK laser assisted in situ keratomileusis; HTN hypertension; DM diabetes mellitus; COPD chronic obstructive pulmonary disease

## 2021-03-30 ENCOUNTER — Ambulatory Visit: Payer: Medicare Other | Admitting: Student

## 2021-03-30 ENCOUNTER — Other Ambulatory Visit: Payer: Self-pay

## 2021-03-30 ENCOUNTER — Encounter: Payer: Self-pay | Admitting: Student

## 2021-03-30 VITALS — BP 136/75 | HR 62 | Temp 97.2°F | Resp 16 | Ht 68.0 in | Wt 266.0 lb

## 2021-03-30 DIAGNOSIS — I340 Nonrheumatic mitral (valve) insufficiency: Secondary | ICD-10-CM

## 2021-03-30 DIAGNOSIS — E875 Hyperkalemia: Secondary | ICD-10-CM | POA: Diagnosis not present

## 2021-03-30 DIAGNOSIS — I10 Essential (primary) hypertension: Secondary | ICD-10-CM | POA: Diagnosis not present

## 2021-03-30 DIAGNOSIS — I25118 Atherosclerotic heart disease of native coronary artery with other forms of angina pectoris: Secondary | ICD-10-CM | POA: Diagnosis not present

## 2021-03-30 DIAGNOSIS — E782 Mixed hyperlipidemia: Secondary | ICD-10-CM

## 2021-03-30 MED ORDER — ROSUVASTATIN CALCIUM 20 MG PO TABS: 20.0000 mg | ORAL_TABLET | Freq: Every day | ORAL | 3 refills | Status: DC

## 2021-03-30 NOTE — Progress Notes (Signed)
Patient referred by [name], DO for coronary artery disease  Subjective:   Suzanne Patton, female    DOB: 1951/01/08, 71 y.o.   MRN: [id number]   Chief Complaint  Patient presents with   Coronary Artery Disease   Hypertension   Follow-up     HPI  71 y.o. Caucasian female with hypertension, hyperlipidemia, type 2 DM, CAD (PCI in 2012), SVT with ablations (in [place]), bipolar disorder, OSA on CPAP, morbid obesity  Patient presents for 25-month follow-up of CAD, hypertension, hyperlipidemia.  Last office visit patient was stable from a cardiovascular standpoint and no changes were made.  Patient was recently treated for influenza with Tamiflu and pneumonia with Augmentin earlier this month.  Since then she has continued to have mild dyspnea which is slowly been improving, still using albuterol inhaler daily.  Patient reports she has tried to make diet changes and is working on weight loss, however she does continue to struggle with dietary compliance and admits to relatively high sodium intake any peanut butter daily.  Upon further questioning patient reports she has not been taking Crestor for an unknown period of time.  Last check her lipids were uncontrolled with LDL at 175.  Patient's blood pressure is now well controlled.  She has had no recurrence of chest pain.  Denies palpitations, dizziness, lightheadedness, syncope, near syncope, orthopnea, PND.  Initial consultation HPI 07/2020: Patient has known CAD with PCI in 2012, has h/o SVT with ablations, all performed in [place].   Patient moved to [place] in July 2020 to be with her daughter.  Most recently, she lived in [place], although she has previously lived in several different states.  Her last visit with her cardiologist in [place] was in September 2020.  Patient has had left-sided chest pressure symptoms with physical and emotional stress, relieved with stress, for long time.  This had been stable, but increased this past  winter.  Patient has 2-3 episodes a week.  She is not on any short or long-acting nitroglycerin.  Patient reports episodes of lightheadedness followed by syncope, most recent episode 3 weeks ago.  She has several other episodes of aborted syncope.  She denies any chest pain or shortness of breath prior to her syncope episode.  She is reportedly undergone extensive neurological work-up in [place], which was reportedly unyielding.  Her diabetes remains uncontrolled.  She is now established care with Dr. [name], with whom she is working actively on management of her diabetes.   Current Outpatient Medications on File Prior to Visit  Medication Sig Dispense Refill   albuterol (VENTOLIN HFA) 108 (90 Base) MCG/ACT inhaler SMARTSIG:1-2 Puff(s) By Mouth 4 Times Daily PRN     Ascorbic Acid (VITAMIN C ER PO) Take 1 tablet by mouth daily at 12 noon.     aspirin 81 MG EC tablet Take 1 tablet by mouth daily.     CALCIUM CITRATE PO Take 1 tablet by mouth daily at 12 noon.     citalopram (CELEXA) 20 MG tablet Take 20 mg by mouth daily.     clonazePAM (KLONOPIN) 0.5 MG tablet Take 0.75 mg by mouth at bedtime.     diltiazem (CARDIZEM CD) 120 MG 24 hr capsule TAKE 1 CAPSULE BY MOUTH EVERY DAY 90 capsule 1   ezetimibe (ZETIA) 10 MG tablet Take 10 mg by mouth daily.     fluticasone (FLONASE) 50 MCG/ACT nasal spray as needed.     ibuprofen (ADVIL) 600 MG tablet as needed.  insulin lispro (HUMALOG) 100 UNIT/ML injection as needed.     labetalol (NORMODYNE) 300 MG tablet Take 1 tablet by mouth in the morning and at bedtime.     lamoTRIgine (LAMICTAL) 200 MG tablet Take 200 mg by mouth 2 (two) times daily.     LEVEMIR 100 UNIT/ML injection 55 Units 2 (two) times daily.     levocetirizine (XYZAL) 5 MG tablet Take 5 mg by mouth every evening.     losartan (COZAAR) 25 MG tablet TAKE 1 TABLET (25 MG TOTAL) BY MOUTH DAILY. 90 tablet 1   nitroGLYCERIN (NITROSTAT) 0.4 MG SL tablet Place 1 tablet (0.4 mg total) under  the tongue every 5 (five) minutes as needed for chest pain. 30 tablet 3   Omega-3 Fatty Acids (FISH OIL OMEGA-3 PO) Take 2 tablets by mouth daily at 12 noon.     omeprazole (PRILOSEC) 20 MG capsule Take 1 capsule by mouth daily.     Semaglutide 7 MG TABS Take 3 mg by mouth in the morning. RYBELSUS     HYDROcodone-acetaminophen (NORCO) 7.5-325 MG tablet Take 1-2 tablets by mouth every 6 (six) hours as needed.     No current facility-administered medications on file prior to visit.    Cardiovascular and other pertinent studies: EKG 03/30/2021: Sinus rhythm at a rate of 66 bpm.  Normal axis.  No evidence of ischemia or underlying injury pattern.  Lexiscan Tetrofosmin stress test 09/05/2020: Lexiscan nuclear stress test performed using 1-day protocol. Patient reported 2/10 chest pain, dyspnea, and dizziness. SPECT images show small sized, mild intensity, reversible perfusion defect in basal anteroseptal myocardium. Stress LVEF is calculated 49%, but visually appears normal. Low risk study.  Echocardiogram 09/05/2020:  Left ventricle cavity is normal in size. Moderate concentric hypertrophy  of the left ventricle. Normal global wall motion. Normal LV systolic  function with EF 60%. Normal diastolic filling pattern.  Structurally normal trileaflet aortic valve. No evidence of aortic  stenosis. Moderate (Grade II) aortic regurgitation.  Moderate (Grade II) mitral regurgitation.  Mild tricuspid regurgitation. Estimated pulmonary artery systolic pressure  26 mmHg.  Mild pulmonic regurgitation.  Mobile cardiac telemetry 13 days 07/25/2020 - 08/08/2020: Dominant rhythm: Sinus. HR 58-150 bpm. Avg HR 71 bpm. 37 episodes of SVT, fastest at 126 bpm for 9 beats, longest for 12 beats at 105 bpm. <1% isolated SVE, couplet/triplets. 1 episode of VT, at 150 bpm for 4 beats <1% isolated VE, no couplet/triplets. No atrial fibrillation/atrial flutter/high grade AV block, sinus pause >3sec noted. 34 patient  triggered events, correlated with sinus rhythm without any arrhthymias.  EKG 07/25/2020: Sinus rhythm 66 bpm Poor R-wave progression, otherwise normal EKG   Recent labs: No flowsheet data found. No flowsheet data found. Lipid Panel  No results found for: CHOL, TRIG, HDL, CHOLHDL, VLDL, LDLCALC, LDLDIRECT HEMOGLOBIN A1C No results found for: HGBA1C, MPG TSH No results for input(s): TSH in the last 8760 hours.  External Labs:  02/23/2021: A1c 6.9% Total cholesterol 281, triglycerides 174, HDL 71, LDL 175 Hgb 11.9, HCT 37.5, MCV 90.1, platelet 257 Sodium 145, potassium 5.5, BUN 17, creatinine 1.49, GFR 35  06/17/2020: Glucose 259, BUN/Cr 19/1.3. EGFR 44. Na/K 141/5.2. AlKP 132. Rest of the CMP normal H/H 13/41. MCV 87. Platelets 250 HbA1C 8.0% Chol 268, TG 181, HDL 81, LDL 155 TSH 1.5 normal    Review of Systems  Constitutional: Negative for malaise/fatigue and weight gain.  Cardiovascular:  Positive for dyspnea on exertion (improving since flu/pneumonia). Negative for chest pain, claudication, leg  swelling, near-syncope, orthopnea, palpitations, paroxysmal nocturnal dyspnea and syncope (No recurrence).  Respiratory:  Negative for shortness of breath.   Neurological:  Negative for dizziness and light-headedness.        Vitals:   03/30/21 1318  BP: 136/75  Pulse: 62  Resp: 16  Temp: (!) 97.2 F (36.2 C)  SpO2: 93%     Body mass index is 40.45 kg/m. Filed Weights   03/30/21 1318  Weight: 266 lb (120.7 kg)     Objective:   Physical Exam Vitals and nursing note reviewed.  Constitutional:      General: She is not in acute distress.    Appearance: She is obese.  Neck:     Vascular: No JVD.  Cardiovascular:     Rate and Rhythm: Normal rate and regular rhythm.     Heart sounds: Normal heart sounds. No murmur heard. Pulmonary:     Effort: Pulmonary effort is normal.     Breath sounds: Normal breath sounds. No wheezing or rales.  Musculoskeletal:     Right  lower leg: No edema.     Left lower leg: No edema.        Assessment & Recommendations:   71 y.o. Caucasian female with hypertension, hyperlipidemia, type 2 DM, CAD, SVT, bipolar disorder, OSA on CPAP, morbid obesity  Coronary artery disease with stable angina: No ischemia on stress testing (08/2020) Episodes of angina controlled with medical therapy.  Continue Aspirin 81 mg, diltiazem 120 mg, labetalol 150 mg bid, SL NTG prn Patient has inadvertently discontinued Crestor, and has been without it for an unknown period of time. Lipids are controlled, LDL 175 at last check. Will resume Crestor 20 mg daily  Hyperlipidemia: Uncontrolled Continue Zetia Resume Crestor Patient is scheduled for repeat lipid profile testing in 8 weeks with PCP, have requested that results be shared with our office.  Hypertension: Well-controlled Continue diltiazem, labetalol, losartan.  Syncope: History of syncope likely related to orthostatic hypotension.  No significant arrhythmia on cardiac monitor No recurrence of syncope or near syncope.  Counseled patient regarding the importance of diet and lifestyle changes, particularly weight loss and reducing sodium intake.  Follow-up in 6 months, sooner if needed, for hyperlipidemia, hypertension, valvular disease, and CAD.   Alethia Berthold, PA-C 03/30/2021, 2:07 PM Office: [phone]

## 2021-04-18 ENCOUNTER — Other Ambulatory Visit: Payer: Self-pay | Admitting: Cardiology

## 2021-04-18 DIAGNOSIS — I10 Essential (primary) hypertension: Secondary | ICD-10-CM

## 2021-04-27 ENCOUNTER — Other Ambulatory Visit: Payer: Self-pay | Admitting: Cardiology

## 2021-04-27 DIAGNOSIS — I25118 Atherosclerotic heart disease of native coronary artery with other forms of angina pectoris: Secondary | ICD-10-CM

## 2021-05-26 DIAGNOSIS — M5459 Other low back pain: Secondary | ICD-10-CM | POA: Diagnosis not present

## 2021-05-26 DIAGNOSIS — M25551 Pain in right hip: Secondary | ICD-10-CM | POA: Diagnosis not present

## 2021-05-30 ENCOUNTER — Other Ambulatory Visit: Payer: Self-pay

## 2021-05-30 ENCOUNTER — Ambulatory Visit (INDEPENDENT_AMBULATORY_CARE_PROVIDER_SITE_OTHER): Payer: Medicare Other | Admitting: Ophthalmology

## 2021-05-30 ENCOUNTER — Encounter (INDEPENDENT_AMBULATORY_CARE_PROVIDER_SITE_OTHER): Payer: Self-pay | Admitting: Ophthalmology

## 2021-05-30 DIAGNOSIS — M9904 Segmental and somatic dysfunction of sacral region: Secondary | ICD-10-CM | POA: Diagnosis not present

## 2021-05-30 DIAGNOSIS — Z79899 Other long term (current) drug therapy: Secondary | ICD-10-CM | POA: Diagnosis not present

## 2021-05-30 DIAGNOSIS — M5136 Other intervertebral disc degeneration, lumbar region: Secondary | ICD-10-CM | POA: Diagnosis not present

## 2021-05-30 DIAGNOSIS — E1129 Type 2 diabetes mellitus with other diabetic kidney complication: Secondary | ICD-10-CM | POA: Diagnosis not present

## 2021-05-30 DIAGNOSIS — E1159 Type 2 diabetes mellitus with other circulatory complications: Secondary | ICD-10-CM | POA: Diagnosis not present

## 2021-05-30 DIAGNOSIS — E113412 Type 2 diabetes mellitus with severe nonproliferative diabetic retinopathy with macular edema, left eye: Secondary | ICD-10-CM | POA: Diagnosis not present

## 2021-05-30 DIAGNOSIS — Z955 Presence of coronary angioplasty implant and graft: Secondary | ICD-10-CM | POA: Diagnosis not present

## 2021-05-30 DIAGNOSIS — M9905 Segmental and somatic dysfunction of pelvic region: Secondary | ICD-10-CM | POA: Diagnosis not present

## 2021-05-30 DIAGNOSIS — M9903 Segmental and somatic dysfunction of lumbar region: Secondary | ICD-10-CM | POA: Diagnosis not present

## 2021-05-30 DIAGNOSIS — E113411 Type 2 diabetes mellitus with severe nonproliferative diabetic retinopathy with macular edema, right eye: Secondary | ICD-10-CM | POA: Diagnosis not present

## 2021-05-30 NOTE — Patient Instructions (Signed)
The nature of severe nonproliferative diabetic retinopathy discussed with the patient as well as the need for more frequent follow up and likely progression to proliferative disease in the near future. The options of continued observation versus panretinal photocoagulation at this time were reviewed as well as the risks, benefits, and alternatives. More recent option includes the use of ocular injectable medications to slow progression of retinal disease. Tight control of glucose, blood pressure, and serum lipid levels were recommended under the direction of general physician or endocrinologist, as well as avoidance of smoking and maintenance of normal body weight. The 2-year risk of progression to proliferative diabetic retinopathy is 60%. 

## 2021-05-30 NOTE — Progress Notes (Signed)
05/30/2021     CHIEF COMPLAINT Patient presents for  Chief Complaint  Patient presents with   Diabetic Retinopathy with Macular Edema      HISTORY OF PRESENT ILLNESS: Suzanne Patton is a 71 y.o. female who presents to the clinic today for:   HPI   10 weeks for dilate ou, oct. Pt states, "If I put my readers on and I look down then ill be fine. However when I don't have it on, my vision is about the same without my readers." Pt states when she is looking down, vision is improved. Pt also states when her blood sugar is high, her vision is not doing so well. Pt had a fall on Sunday and broke her glasses. Pt currently has a bad scar above OD brow bone. Pt is currently taking rosuvastatin every morning.  Pt was diagnosed with Norovisrus a couple of weeks ago.  Pt is taking "Refresh- preservative free"  Last edited by Angeline Slim on 05/30/2021  3:26 PM.      Referring physician: Jethro Bolus, MD [address] [place],  [place] [address]  HISTORICAL INFORMATION:   Selected notes from the MEDICAL RECORD NUMBER       CURRENT MEDICATIONS: No current outpatient medications on file. (Ophthalmic Drugs)   No current facility-administered medications for this visit. (Ophthalmic Drugs)   Current Outpatient Medications (Other)  Medication Sig   albuterol (VENTOLIN HFA) 108 (90 Base) MCG/ACT inhaler SMARTSIG:1-2 Puff(s) By Mouth 4 Times Daily PRN   Ascorbic Acid (VITAMIN C ER PO) Take 1 tablet by mouth daily at 12 noon.   aspirin 81 MG EC tablet Take 1 tablet by mouth daily.   CALCIUM CITRATE PO Take 1 tablet by mouth daily at 12 noon.   citalopram (CELEXA) 20 MG tablet Take 20 mg by mouth daily.   clonazePAM (KLONOPIN) 0.5 MG tablet Take 0.75 mg by mouth at bedtime.   diltiazem (CARDIZEM CD) 120 MG 24 hr capsule TAKE 1 CAPSULE BY MOUTH EVERY DAY   ezetimibe (ZETIA) 10 MG tablet Take 10 mg by mouth daily.   fluticasone (FLONASE) 50 MCG/ACT nasal spray as needed.    HYDROcodone-acetaminophen (NORCO) 7.5-325 MG tablet Take 1-2 tablets by mouth every 6 (six) hours as needed.   ibuprofen (ADVIL) 600 MG tablet as needed.   insulin lispro (HUMALOG) 100 UNIT/ML injection as needed.   labetalol (NORMODYNE) 300 MG tablet Take 1 tablet by mouth in the morning and at bedtime.   lamoTRIgine (LAMICTAL) 200 MG tablet Take 200 mg by mouth 2 (two) times daily.   LEVEMIR 100 UNIT/ML injection 55 Units 2 (two) times daily.   levocetirizine (XYZAL) 5 MG tablet Take 5 mg by mouth every evening.   losartan (COZAAR) 25 MG tablet TAKE 1 TABLET (25 MG TOTAL) BY MOUTH DAILY.   nitroGLYCERIN (NITROSTAT) 0.4 MG SL tablet Place 1 tablet (0.4 mg total) under the tongue every 5 (five) minutes as needed for chest pain.   Omega-3 Fatty Acids (FISH OIL OMEGA-3 PO) Take 2 tablets by mouth daily at 12 noon.   omeprazole (PRILOSEC) 20 MG capsule Take 1 capsule by mouth daily.   rosuvastatin (CRESTOR) 20 MG tablet Take 1 tablet (20 mg total) by mouth daily.   Semaglutide 7 MG TABS Take 3 mg by mouth in the morning. RYBELSUS   No current facility-administered medications for this visit. (Other)      REVIEW OF SYSTEMS: ROS   Positive for: Endocrine Negative for: Constitutional, Gastrointestinal, Neurological,  Skin, Genitourinary, Musculoskeletal, HENT, Cardiovascular, Eyes, Respiratory, Psychiatric, Allergic/Imm, Heme/Lymph Last edited by Angeline SlimMa, [place] on 05/30/2021  3:26 PM.       ALLERGIES Allergies  Allergen Reactions   Dulaglutide Shortness Of Breath   Meclizine Other (See Comments)   Prednisone     Other reaction(s): hyperglycemia   Quinolones     Other reaction(s): severe muscle aches    PAST MEDICAL HISTORY Past Medical History:  Diagnosis Date   Diabetes mellitus without complication (HCC)    Hyperlipidemia    Hypertension    Past Surgical History:  Procedure Laterality Date   ABLATION     CAROTID STENT     CHOLECYSTECTOMY     HYSTEROTOMY     ROTATOR CUFF REPAIR  Right     FAMILY HISTORY Family History  Problem Relation Age of Onset   Atrial fibrillation Mother    Kidney failure Mother    Heart disease Father    Heart attack Father    Heart failure Father    COPD Father     SOCIAL HISTORY Social History   Tobacco Use   Smoking status: Never   Smokeless tobacco: Never  Vaping Use   Vaping Use: Never used  Substance Use Topics   Alcohol use: Yes    Comment: rare   Drug use: Never         OPHTHALMIC EXAM:  Base Eye Exam     Visual Acuity (ETDRS)       Right Left   Dist cc 20/40 20/60   Dist ph cc 20/30 20/50    Correction: Glasses         Tonometry (Tonopen, 3:34 PM)       Right Left   Pressure 8 6         Pupils       APD   Right None   Left None         Visual Fields       Left Right    Full Full         Extraocular Movement       Right Left    Full Full         Neuro/Psych     Oriented x3: Yes   Mood/Affect: Normal         Dilation     Both eyes: 1.0% Mydriacyl, 2.5% Phenylephrine @ 3:34 PM           Slit Lamp and Fundus Exam     External Exam       Right Left   External Normal Normal         Slit Lamp Exam       Right Left   Lids/Lashes Normal Normal   Conjunctiva/Sclera White and quiet White and quiet   Cornea Clear Clear   Anterior Chamber Deep and quiet Deep and quiet   Iris Round and reactive Round and reactive   Lens Centered posterior chamber intraocular lens, 1+ Posterior capsular opacification Centered posterior chamber intraocular lens   Anterior Vitreous Normal Normal         Fundus Exam       Right Left   Posterior Vitreous Normal Normal   Disc Normal Normal   C/D Ratio 0.45 0.5   Macula Microaneurysms Microaneurysms   Vessels NPDR-Severe NPDR-Severe   Periphery Normal Normal            IMAGING AND PROCEDURES  Imaging and Procedures for 05/30/21  OCT, Retina -  OU - Both Eyes       Right Eye Quality was good. Scan locations  included subfoveal. Central Foveal Thickness: 276. Progression has been stable. Findings include abnormal foveal contour.   Left Eye Quality was borderline. Scan locations included subfoveal. Central Foveal Thickness: 317. Progression has been stable. Findings include abnormal foveal contour.              ASSESSMENT/PLAN:  Severe nonproliferative diabetic retinopathy of right eye, with macular edema, associated with type 2 diabetes mellitus (HCC) OD now some 10 weeks post most recent injection, looks great no recurrence, will continue to monitor follow-up in 3 months  Severe nonproliferative diabetic retinopathy of left eye, with macular edema, associated with type 2 diabetes mellitus (HCC) The nature of severe nonproliferative diabetic retinopathy discussed with the patient as well as the need for more frequent follow up and likely progression to proliferative disease in the near future. The options of continued observation versus panretinal photocoagulation at this time were reviewed as well as the risks, benefits, and alternatives. More recent option includes the use of ocular injectable medications to slow progression of retinal disease. Tight control of glucose, blood pressure, and serum lipid levels were recommended under the direction of general physician or endocrinologist, as well as avoidance of smoking and maintenance of normal body weight. The 2-year risk of progression to proliferative diabetic retinopathy is 60%.No active CSME OS follow-up in 54-month      ICD-10-CM   1. Severe nonproliferative diabetic retinopathy of right eye, with macular edema, associated with type 2 diabetes mellitus (HCC)  E11.3411 OCT, Retina - OU - Both Eyes    2. Severe nonproliferative diabetic retinopathy of left eye, with macular edema, associated with type 2 diabetes mellitus (HCC)  I50.2774       1.  OD, with no sign of recurrence of CSME post injection for CSME related disease and will continue  to monitor  2.  OU, with moderate to severe NPDR, early, but no sign of recurrence of CSME  3.  We will study next time with fluorescein angiography to confirm stability of disease  Ophthalmic Meds Ordered this visit:  No orders of the defined types were placed in this encounter.      Return in about 4 months (around 09/29/2021) for DILATE OU, OCT, OPTOS FFA R/L.  Patient Instructions  The nature of severe nonproliferative diabetic retinopathy discussed with the patient as well as the need for more frequent follow up and likely progression to proliferative disease in the near future. The options of continued observation versus panretinal photocoagulation at this time were reviewed as well as the risks, benefits, and alternatives. More recent option includes the use of ocular injectable medications to slow progression of retinal disease. Tight control of glucose, blood pressure, and serum lipid levels were recommended under the direction of general physician or endocrinologist, as well as avoidance of smoking and maintenance of normal body weight. The 2-year risk of progression to proliferative diabetic retinopathy is 60%.    Explained the diagnoses, plan, and follow up with the patient and they expressed understanding.  Patient expressed understanding of the importance of proper follow up care.   Alford Highland Jarely Juncaj M.D. Diseases & Surgery of the Retina and Vitreous [facility] 05/30/21     Abbreviations: M myopia (nearsighted); A astigmatism; H hyperopia (farsighted); P presbyopia; Mrx spectacle prescription;  CTL contact lenses; OD right eye; OS left eye; OU both eyes  XT exotropia; ET  esotropia; PEK punctate epithelial keratitis; PEE punctate epithelial erosions; DES dry eye syndrome; MGD meibomian gland dysfunction; ATs artificial tears; PFAT's preservative free artificial tears; NSC nuclear sclerotic cataract; PSC posterior subcapsular cataract; ERM epi-retinal membrane;  PVD posterior vitreous detachment; RD retinal detachment; DM diabetes mellitus; DR diabetic retinopathy; NPDR non-proliferative diabetic retinopathy; PDR proliferative diabetic retinopathy; CSME clinically significant macular edema; DME diabetic macular edema; dbh dot blot hemorrhages; CWS cotton wool spot; POAG primary open angle glaucoma; C/D cup-to-disc ratio; HVF humphrey visual field; GVF goldmann visual field; OCT optical coherence tomography; IOP intraocular pressure; BRVO Branch retinal vein occlusion; CRVO central retinal vein occlusion; CRAO central retinal artery occlusion; BRAO branch retinal artery occlusion; RT retinal tear; SB scleral buckle; PPV pars plana vitrectomy; VH Vitreous hemorrhage; PRP panretinal laser photocoagulation; IVK intravitreal kenalog; VMT vitreomacular traction; MH Macular hole;  NVD neovascularization of the disc; NVE neovascularization elsewhere; AREDS age related eye disease study; ARMD age related macular degeneration; POAG primary open angle glaucoma; EBMD epithelial/anterior basement membrane dystrophy; ACIOL anterior chamber intraocular lens; IOL intraocular lens; PCIOL posterior chamber intraocular lens; Phaco/IOL phacoemulsification with intraocular lens placement; PRK photorefractive keratectomy; LASIK laser assisted in situ keratomileusis; HTN hypertension; DM diabetes mellitus; COPD chronic obstructive pulmonary disease

## 2021-05-30 NOTE — Assessment & Plan Note (Addendum)
The nature of severe nonproliferative diabetic retinopathy discussed with the patient as well as the need for more frequent follow up and likely progression to proliferative disease in the near future. The options of continued observation versus panretinal photocoagulation at this time were reviewed as well as the risks, benefits, and alternatives. More recent option includes the use of ocular injectable medications to slow progression of retinal disease. Tight control of glucose, blood pressure, and serum lipid levels were recommended under the direction of general physician or endocrinologist, as well as avoidance of smoking and maintenance of normal body weight. The 2-year risk of progression to proliferative diabetic retinopathy is 60%.No active CSME OS follow-up in 60-month ?

## 2021-05-30 NOTE — Assessment & Plan Note (Signed)
OD now some 10 weeks post most recent injection, looks great no recurrence, will continue to monitor follow-up in 3 months ?

## 2021-06-05 DIAGNOSIS — M5136 Other intervertebral disc degeneration, lumbar region: Secondary | ICD-10-CM | POA: Diagnosis not present

## 2021-06-05 DIAGNOSIS — E1159 Type 2 diabetes mellitus with other circulatory complications: Secondary | ICD-10-CM | POA: Diagnosis not present

## 2021-06-05 DIAGNOSIS — Z955 Presence of coronary angioplasty implant and graft: Secondary | ICD-10-CM | POA: Diagnosis not present

## 2021-06-05 DIAGNOSIS — E1129 Type 2 diabetes mellitus with other diabetic kidney complication: Secondary | ICD-10-CM | POA: Diagnosis not present

## 2021-06-13 DIAGNOSIS — M545 Low back pain, unspecified: Secondary | ICD-10-CM | POA: Diagnosis not present

## 2021-06-21 DIAGNOSIS — M545 Low back pain, unspecified: Secondary | ICD-10-CM | POA: Diagnosis not present

## 2021-06-28 DIAGNOSIS — M545 Low back pain, unspecified: Secondary | ICD-10-CM | POA: Diagnosis not present

## 2021-07-06 DIAGNOSIS — E1140 Type 2 diabetes mellitus with diabetic neuropathy, unspecified: Secondary | ICD-10-CM | POA: Diagnosis not present

## 2021-07-06 DIAGNOSIS — E1159 Type 2 diabetes mellitus with other circulatory complications: Secondary | ICD-10-CM | POA: Diagnosis not present

## 2021-07-06 DIAGNOSIS — E7800 Pure hypercholesterolemia, unspecified: Secondary | ICD-10-CM | POA: Diagnosis not present

## 2021-07-06 DIAGNOSIS — I2510 Atherosclerotic heart disease of native coronary artery without angina pectoris: Secondary | ICD-10-CM | POA: Diagnosis not present

## 2021-07-07 DIAGNOSIS — M545 Low back pain, unspecified: Secondary | ICD-10-CM | POA: Diagnosis not present

## 2021-07-12 DIAGNOSIS — E1159 Type 2 diabetes mellitus with other circulatory complications: Secondary | ICD-10-CM | POA: Diagnosis not present

## 2021-07-12 DIAGNOSIS — E1129 Type 2 diabetes mellitus with other diabetic kidney complication: Secondary | ICD-10-CM | POA: Diagnosis not present

## 2021-07-12 DIAGNOSIS — I10 Essential (primary) hypertension: Secondary | ICD-10-CM | POA: Diagnosis not present

## 2021-07-12 DIAGNOSIS — M545 Low back pain, unspecified: Secondary | ICD-10-CM | POA: Diagnosis not present

## 2021-07-14 DIAGNOSIS — M545 Low back pain, unspecified: Secondary | ICD-10-CM | POA: Diagnosis not present

## 2021-07-31 DIAGNOSIS — M545 Low back pain, unspecified: Secondary | ICD-10-CM | POA: Diagnosis not present

## 2021-08-03 DIAGNOSIS — M545 Low back pain, unspecified: Secondary | ICD-10-CM | POA: Diagnosis not present

## 2021-08-06 ENCOUNTER — Encounter (HOSPITAL_COMMUNITY)
Admission: EM | Disposition: A | Discharge status: Home / Self Care | Payer: Self-pay | Source: Home / Self Care | Attending: Internal Medicine

## 2021-08-06 ENCOUNTER — Other Ambulatory Visit: Payer: Self-pay

## 2021-08-06 ENCOUNTER — Observation Stay (HOSPITAL_BASED_OUTPATIENT_CLINIC_OR_DEPARTMENT_OTHER): Payer: Medicare Other | Admitting: Certified Registered Nurse Anesthetist

## 2021-08-06 ENCOUNTER — Encounter (HOSPITAL_COMMUNITY): Payer: Self-pay | Admitting: Emergency Medicine

## 2021-08-06 ENCOUNTER — Inpatient Hospital Stay (HOSPITAL_COMMUNITY)
Admission: EM | Admit: 2021-08-06 | Discharge: 2021-08-09 | DRG: 580 | Disposition: A | Discharge status: Home / Self Care | Payer: Medicare Other | Attending: Internal Medicine | Admitting: Internal Medicine

## 2021-08-06 ENCOUNTER — Observation Stay (HOSPITAL_COMMUNITY): Payer: Medicare Other | Admitting: Certified Registered Nurse Anesthetist

## 2021-08-06 DIAGNOSIS — Z8616 Personal history of COVID-19: Secondary | ICD-10-CM

## 2021-08-06 DIAGNOSIS — E872 Acidosis, unspecified: Secondary | ICD-10-CM | POA: Diagnosis not present

## 2021-08-06 DIAGNOSIS — E782 Mixed hyperlipidemia: Secondary | ICD-10-CM | POA: Diagnosis not present

## 2021-08-06 DIAGNOSIS — I2510 Atherosclerotic heart disease of native coronary artery without angina pectoris: Secondary | ICD-10-CM | POA: Diagnosis present

## 2021-08-06 DIAGNOSIS — E1122 Type 2 diabetes mellitus with diabetic chronic kidney disease: Secondary | ICD-10-CM | POA: Diagnosis not present

## 2021-08-06 DIAGNOSIS — Z7982 Long term (current) use of aspirin: Secondary | ICD-10-CM | POA: Diagnosis not present

## 2021-08-06 DIAGNOSIS — G4733 Obstructive sleep apnea (adult) (pediatric): Secondary | ICD-10-CM | POA: Diagnosis present

## 2021-08-06 DIAGNOSIS — E1151 Type 2 diabetes mellitus with diabetic peripheral angiopathy without gangrene: Secondary | ICD-10-CM | POA: Diagnosis not present

## 2021-08-06 DIAGNOSIS — L02211 Cutaneous abscess of abdominal wall: Principal | ICD-10-CM | POA: Diagnosis present

## 2021-08-06 DIAGNOSIS — Z841 Family history of disorders of kidney and ureter: Secondary | ICD-10-CM | POA: Diagnosis not present

## 2021-08-06 DIAGNOSIS — Z79899 Other long term (current) drug therapy: Secondary | ICD-10-CM | POA: Diagnosis not present

## 2021-08-06 DIAGNOSIS — L03311 Cellulitis of abdominal wall: Secondary | ICD-10-CM | POA: Diagnosis not present

## 2021-08-06 DIAGNOSIS — Z9049 Acquired absence of other specified parts of digestive tract: Secondary | ICD-10-CM | POA: Diagnosis not present

## 2021-08-06 DIAGNOSIS — A419 Sepsis, unspecified organism: Secondary | ICD-10-CM | POA: Diagnosis not present

## 2021-08-06 DIAGNOSIS — Z794 Long term (current) use of insulin: Secondary | ICD-10-CM

## 2021-08-06 DIAGNOSIS — Z955 Presence of coronary angioplasty implant and graft: Secondary | ICD-10-CM | POA: Diagnosis not present

## 2021-08-06 DIAGNOSIS — E875 Hyperkalemia: Secondary | ICD-10-CM | POA: Diagnosis present

## 2021-08-06 DIAGNOSIS — Z8249 Family history of ischemic heart disease and other diseases of the circulatory system: Secondary | ICD-10-CM | POA: Diagnosis not present

## 2021-08-06 DIAGNOSIS — Z825 Family history of asthma and other chronic lower respiratory diseases: Secondary | ICD-10-CM | POA: Diagnosis not present

## 2021-08-06 DIAGNOSIS — F39 Unspecified mood [affective] disorder: Secondary | ICD-10-CM | POA: Diagnosis present

## 2021-08-06 DIAGNOSIS — I129 Hypertensive chronic kidney disease with stage 1 through stage 4 chronic kidney disease, or unspecified chronic kidney disease: Secondary | ICD-10-CM | POA: Diagnosis not present

## 2021-08-06 DIAGNOSIS — J449 Chronic obstructive pulmonary disease, unspecified: Secondary | ICD-10-CM | POA: Diagnosis not present

## 2021-08-06 DIAGNOSIS — I10 Essential (primary) hypertension: Secondary | ICD-10-CM | POA: Diagnosis not present

## 2021-08-06 DIAGNOSIS — Z888 Allergy status to other drugs, medicaments and biological substances status: Secondary | ICD-10-CM | POA: Diagnosis not present

## 2021-08-06 DIAGNOSIS — N1832 Chronic kidney disease, stage 3b: Secondary | ICD-10-CM | POA: Diagnosis present

## 2021-08-06 DIAGNOSIS — E119 Type 2 diabetes mellitus without complications: Secondary | ICD-10-CM

## 2021-08-06 DIAGNOSIS — R21 Rash and other nonspecific skin eruption: Secondary | ICD-10-CM | POA: Diagnosis present

## 2021-08-06 DIAGNOSIS — Z6841 Body Mass Index (BMI) 40.0 and over, adult: Secondary | ICD-10-CM

## 2021-08-06 HISTORY — PX: IRRIGATION AND DEBRIDEMENT ABSCESS: SHX5252

## 2021-08-06 HISTORY — DX: Chronic kidney disease, stage 3b: N18.32

## 2021-08-06 HISTORY — DX: Procedure and treatment not carried out because of patient's decision for reasons of belief and group pressure: Z53.1

## 2021-08-06 HISTORY — DX: Other complications of anesthesia, initial encounter: T88.59XA

## 2021-08-06 HISTORY — DX: Myoneural disorder, unspecified: G70.9

## 2021-08-06 HISTORY — DX: Sleep apnea, unspecified: G47.30

## 2021-08-06 HISTORY — DX: Atherosclerotic heart disease of native coronary artery without angina pectoris: I25.10

## 2021-08-06 HISTORY — DX: Reserved for inherently not codable concepts without codable children: IMO0001

## 2021-08-06 HISTORY — DX: Peripheral vascular disease, unspecified: I73.9

## 2021-08-06 LAB — CBC
HCT: 36.1 % (ref 36.0–46.0)
HCT: 38.2 % (ref 36.0–46.0)
Hemoglobin: 11.4 g/dL — ABNORMAL LOW (ref 12.0–15.0)
Hemoglobin: 12.5 g/dL (ref 12.0–15.0)
MCH: 28.3 pg (ref 26.0–34.0)
MCH: 29.1 pg (ref 26.0–34.0)
MCHC: 31.6 g/dL (ref 30.0–36.0)
MCHC: 32.7 g/dL (ref 30.0–36.0)
MCV: 88.8 fL (ref 80.0–100.0)
MCV: 89.6 fL (ref 80.0–100.0)
Platelets: 218 10*3/uL (ref 150–400)
Platelets: 218 10*3/uL (ref 150–400)
RBC: 4.03 MIL/uL (ref 3.87–5.11)
RBC: 4.3 MIL/uL (ref 3.87–5.11)
RDW: 13.2 % (ref 11.5–15.5)
RDW: 13.2 % (ref 11.5–15.5)
WBC: 11.2 10*3/uL — ABNORMAL HIGH (ref 4.0–10.5)
WBC: 12.9 10*3/uL — ABNORMAL HIGH (ref 4.0–10.5)
nRBC: 0 % (ref 0.0–0.2)
nRBC: 0 % (ref 0.0–0.2)

## 2021-08-06 LAB — NO BLOOD PRODUCTS

## 2021-08-06 LAB — LACTIC ACID, PLASMA
Lactic Acid, Venous: 2.7 mmol/L (ref 0.5–1.9)
Lactic Acid, Venous: 0.7 mmol/L (ref 0.5–1.9)

## 2021-08-06 LAB — COMPREHENSIVE METABOLIC PANEL
ALT: 14 U/L (ref 0–44)
AST: 18 U/L (ref 15–41)
Albumin: 3.8 g/dL (ref 3.5–5.0)
Alkaline Phosphatase: 84 U/L (ref 38–126)
Anion gap: 8 (ref 5–15)
BUN: 20 mg/dL (ref 8–23)
CO2: 28 mmol/L (ref 22–32)
Calcium: 9.3 mg/dL (ref 8.9–10.3)
Chloride: 103 mmol/L (ref 98–111)
Creatinine, Ser: 1.67 mg/dL — ABNORMAL HIGH (ref 0.44–1.00)
GFR, Estimated: 33 mL/min — ABNORMAL LOW (ref >60)
Glucose, Bld: 230 mg/dL — ABNORMAL HIGH (ref 70–99)
Potassium: 4.7 mmol/L (ref 3.5–5.1)
Sodium: 139 mmol/L (ref 135–145)
Total Bilirubin: 1 mg/dL (ref 0.3–1.2)
Total Protein: 7.1 g/dL (ref 6.5–8.1)

## 2021-08-06 LAB — GLUCOSE, CAPILLARY
Glucose-Capillary: 110 mg/dL — ABNORMAL HIGH (ref 70–99)
Glucose-Capillary: 85 mg/dL (ref 70–99)

## 2021-08-06 LAB — LIPASE, BLOOD: Lipase: 26 U/L (ref 11–51)

## 2021-08-06 SURGERY — IRRIGATION AND DEBRIDEMENT ABSCESS
Anesthesia: General | Site: Abdomen

## 2021-08-06 MED ORDER — VANCOMYCIN HCL 1750 MG/350ML IV SOLN
1750.0000 mg | INTRAVENOUS | Status: DC
  Administered 2021-08-06: 2000 mg via INTRAVENOUS
  Filled 2021-08-06: qty 350

## 2021-08-06 MED ORDER — BISACODYL 5 MG PO TBEC: 5.0000 mg | DELAYED_RELEASE_TABLET | Freq: Every day | ORAL | Status: DC | PRN

## 2021-08-06 MED ORDER — ROCURONIUM BROMIDE 10 MG/ML (PF) SYRINGE
PREFILLED_SYRINGE | INTRAVENOUS | Status: DC | PRN
  Administered 2021-08-06: 40 mg via INTRAVENOUS

## 2021-08-06 MED ORDER — 0.9 % SODIUM CHLORIDE (POUR BTL) OPTIME
TOPICAL | Status: DC | PRN
  Administered 2021-08-06: 1000 mL

## 2021-08-06 MED ORDER — INSULIN DETEMIR 100 UNIT/ML ~~LOC~~ SOLN
60.0000 [IU] | Freq: Two times a day (BID) | SUBCUTANEOUS | Status: DC
  Administered 2021-08-06 – 2021-08-09 (×6): 60 [IU] via SUBCUTANEOUS
  Filled 2021-08-06 (×9): qty 0.6

## 2021-08-06 MED ORDER — PROPOFOL 10 MG/ML IV BOLUS
INTRAVENOUS | Status: DC | PRN
Start: 2021-08-06 — End: 2021-08-06
  Administered 2021-08-06: 150 mg via INTRAVENOUS

## 2021-08-06 MED ORDER — LACTATED RINGERS IV SOLN: INTRAVENOUS | Status: DC

## 2021-08-06 MED ORDER — ACETAMINOPHEN 10 MG/ML IV SOLN
INTRAVENOUS | Status: AC
  Filled 2021-08-06: qty 100

## 2021-08-06 MED ORDER — FENTANYL CITRATE (PF) 250 MCG/5ML IJ SOLN
INTRAMUSCULAR | Status: DC | PRN
  Administered 2021-08-06: 50 ug via INTRAVENOUS

## 2021-08-06 MED ORDER — ACETAMINOPHEN 325 MG PO TABS
650.0000 mg | ORAL_TABLET | Freq: Four times a day (QID) | ORAL | Status: DC | PRN
Start: 2021-08-06 — End: 2021-08-09
  Administered 2021-08-08: 650 mg via ORAL
  Filled 2021-08-06: qty 2

## 2021-08-06 MED ORDER — LEVOCETIRIZINE DIHYDROCHLORIDE 5 MG PO TABS: 5.0000 mg | ORAL_TABLET | Freq: Every day | ORAL | Status: DC

## 2021-08-06 MED ORDER — PHENYLEPHRINE 80 MCG/ML (10ML) SYRINGE FOR IV PUSH (FOR BLOOD PRESSURE SUPPORT)
PREFILLED_SYRINGE | INTRAVENOUS | Status: AC
  Filled 2021-08-06: qty 10

## 2021-08-06 MED ORDER — MIDAZOLAM HCL 2 MG/2ML IJ SOLN
INTRAMUSCULAR | Status: AC
  Filled 2021-08-06: qty 2

## 2021-08-06 MED ORDER — MORPHINE SULFATE (PF) 2 MG/ML IV SOLN: 2.0000 mg | INTRAVENOUS | Status: DC | PRN

## 2021-08-06 MED ORDER — ACETAMINOPHEN 650 MG RE SUPP: 650.0000 mg | Freq: Four times a day (QID) | RECTAL | Status: DC | PRN

## 2021-08-06 MED ORDER — VANCOMYCIN HCL 2000 MG/400ML IV SOLN
2000.0000 mg | Freq: Once | INTRAVENOUS | Status: DC
  Filled 2021-08-06 (×2): qty 400

## 2021-08-06 MED ORDER — PANTOPRAZOLE SODIUM 40 MG PO TBEC
40.0000 mg | DELAYED_RELEASE_TABLET | Freq: Every day | ORAL | Status: DC
  Administered 2021-08-07 – 2021-08-09 (×3): 40 mg via ORAL
  Filled 2021-08-06 (×3): qty 1

## 2021-08-06 MED ORDER — LORATADINE 10 MG PO TABS
10.0000 mg | ORAL_TABLET | Freq: Every day | ORAL | Status: DC
  Administered 2021-08-06 – 2021-08-08 (×3): 10 mg via ORAL
  Filled 2021-08-06 (×3): qty 1

## 2021-08-06 MED ORDER — CITALOPRAM HYDROBROMIDE 20 MG PO TABS
20.0000 mg | ORAL_TABLET | Freq: Every morning | ORAL | Status: DC
Start: 2021-08-07 — End: 2021-08-09
  Administered 2021-08-07 – 2021-08-09 (×3): 20 mg via ORAL
  Filled 2021-08-06 (×3): qty 1

## 2021-08-06 MED ORDER — ENOXAPARIN SODIUM 40 MG/0.4ML IJ SOSY
40.0000 mg | PREFILLED_SYRINGE | INTRAMUSCULAR | Status: DC
  Administered 2021-08-06: 40 mg via SUBCUTANEOUS
  Filled 2021-08-06: qty 0.4

## 2021-08-06 MED ORDER — ONDANSETRON HCL 4 MG PO TABS: 4.0000 mg | ORAL_TABLET | Freq: Four times a day (QID) | ORAL | Status: DC | PRN

## 2021-08-06 MED ORDER — PROPOFOL 10 MG/ML IV BOLUS
INTRAVENOUS | Status: AC
  Filled 2021-08-06: qty 20

## 2021-08-06 MED ORDER — ASPIRIN 81 MG PO TBEC
81.0000 mg | DELAYED_RELEASE_TABLET | Freq: Every day | ORAL | Status: DC
  Administered 2021-08-06 – 2021-08-08 (×3): 81 mg via ORAL
  Filled 2021-08-06 (×3): qty 1

## 2021-08-06 MED ORDER — ROCURONIUM BROMIDE 10 MG/ML (PF) SYRINGE
PREFILLED_SYRINGE | INTRAVENOUS | Status: AC
  Filled 2021-08-06: qty 40

## 2021-08-06 MED ORDER — CEFEPIME HCL 2 G IV SOLR
2.0000 g | Freq: Two times a day (BID) | INTRAVENOUS | Status: DC
  Administered 2021-08-06 – 2021-08-08 (×5): 2 g via INTRAVENOUS
  Filled 2021-08-06 (×5): qty 12.5

## 2021-08-06 MED ORDER — ALBUTEROL SULFATE (2.5 MG/3ML) 0.083% IN NEBU: 2.5000 mg | INHALATION_SOLUTION | Freq: Four times a day (QID) | RESPIRATORY_TRACT | Status: DC | PRN

## 2021-08-06 MED ORDER — LAMOTRIGINE 100 MG PO TABS
200.0000 mg | ORAL_TABLET | Freq: Two times a day (BID) | ORAL | Status: DC
  Administered 2021-08-06 – 2021-08-09 (×6): 200 mg via ORAL
  Filled 2021-08-06 (×6): qty 2

## 2021-08-06 MED ORDER — CHLORHEXIDINE GLUCONATE 0.12 % MT SOLN
OROMUCOSAL | Status: AC
Start: 2021-08-06 — End: 2021-08-06
  Administered 2021-08-06: 15 mL via OROMUCOSAL
  Filled 2021-08-06: qty 15

## 2021-08-06 MED ORDER — LIDOCAINE 2% (20 MG/ML) 5 ML SYRINGE
INTRAMUSCULAR | Status: AC
  Filled 2021-08-06: qty 20

## 2021-08-06 MED ORDER — ONDANSETRON HCL 4 MG/2ML IJ SOLN
INTRAMUSCULAR | Status: DC | PRN
  Administered 2021-08-06: 4 mg via INTRAVENOUS

## 2021-08-06 MED ORDER — SUGAMMADEX SODIUM 200 MG/2ML IV SOLN
INTRAVENOUS | Status: DC | PRN
  Administered 2021-08-06 (×3): 100 mg via INTRAVENOUS

## 2021-08-06 MED ORDER — DOCUSATE SODIUM 100 MG PO CAPS
100.0000 mg | ORAL_CAPSULE | Freq: Two times a day (BID) | ORAL | Status: DC
  Administered 2021-08-06 – 2021-08-09 (×6): 100 mg via ORAL
  Filled 2021-08-06 (×6): qty 1

## 2021-08-06 MED ORDER — HYDRALAZINE HCL 20 MG/ML IJ SOLN: 5.0000 mg | INTRAMUSCULAR | Status: DC | PRN

## 2021-08-06 MED ORDER — DEXAMETHASONE SODIUM PHOSPHATE 10 MG/ML IJ SOLN
INTRAMUSCULAR | Status: DC | PRN
Start: 2021-08-06 — End: 2021-08-06
  Administered 2021-08-06: 4 mg via INTRAVENOUS

## 2021-08-06 MED ORDER — LACTATED RINGERS IV BOLUS (SEPSIS): 500.0000 mL | Freq: Once | INTRAVENOUS | Status: DC

## 2021-08-06 MED ORDER — LABETALOL HCL 200 MG PO TABS
300.0000 mg | ORAL_TABLET | Freq: Two times a day (BID) | ORAL | Status: DC
  Administered 2021-08-06 – 2021-08-09 (×6): 300 mg via ORAL
  Filled 2021-08-06 (×7): qty 1.5

## 2021-08-06 MED ORDER — ONDANSETRON HCL 4 MG/2ML IJ SOLN: 4.0000 mg | Freq: Four times a day (QID) | INTRAMUSCULAR | Status: DC | PRN

## 2021-08-06 MED ORDER — INSULIN ASPART 100 UNIT/ML IJ SOLN: 0.0000 [IU] | INTRAMUSCULAR | Status: DC | PRN

## 2021-08-06 MED ORDER — PIPERACILLIN-TAZOBACTAM 3.375 G IVPB 30 MIN
3.3750 g | Freq: Once | INTRAVENOUS | Status: AC
  Administered 2021-08-06: 3.375 g via INTRAVENOUS
  Filled 2021-08-06: qty 50

## 2021-08-06 MED ORDER — CHLORHEXIDINE GLUCONATE 0.12 % MT SOLN: 15.0000 mL | Freq: Once | OROMUCOSAL | Status: AC

## 2021-08-06 MED ORDER — CLONAZEPAM 0.25 MG PO TBDP
0.7500 mg | ORAL_TABLET | Freq: Every day | ORAL | Status: DC
  Administered 2021-08-06 – 2021-08-08 (×3): 0.75 mg via ORAL
  Filled 2021-08-06 (×3): qty 3

## 2021-08-06 MED ORDER — ROSUVASTATIN CALCIUM 20 MG PO TABS
20.0000 mg | ORAL_TABLET | Freq: Every morning | ORAL | Status: DC
  Administered 2021-08-07 – 2021-08-09 (×3): 20 mg via ORAL
  Filled 2021-08-06 (×3): qty 1

## 2021-08-06 MED ORDER — ONDANSETRON HCL 4 MG/2ML IJ SOLN
INTRAMUSCULAR | Status: AC
  Filled 2021-08-06: qty 8

## 2021-08-06 MED ORDER — ACETAMINOPHEN 10 MG/ML IV SOLN
INTRAVENOUS | Status: DC | PRN
Start: 2021-08-06 — End: 2021-08-06
  Administered 2021-08-06: 1000 mg via INTRAVENOUS

## 2021-08-06 MED ORDER — MIDAZOLAM HCL 2 MG/2ML IJ SOLN
INTRAMUSCULAR | Status: DC | PRN
Start: 2021-08-06 — End: 2021-08-06
  Administered 2021-08-06: 1 mg via INTRAVENOUS

## 2021-08-06 MED ORDER — EZETIMIBE 10 MG PO TABS
10.0000 mg | ORAL_TABLET | Freq: Every morning | ORAL | Status: DC
  Administered 2021-08-07 – 2021-08-09 (×3): 10 mg via ORAL
  Filled 2021-08-06 (×3): qty 1

## 2021-08-06 MED ORDER — LIDOCAINE 2% (20 MG/ML) 5 ML SYRINGE
INTRAMUSCULAR | Status: DC | PRN
  Administered 2021-08-06: 60 mg via INTRAVENOUS

## 2021-08-06 MED ORDER — DILTIAZEM HCL ER COATED BEADS 120 MG PO CP24
120.0000 mg | ORAL_CAPSULE | Freq: Every morning | ORAL | Status: DC
Start: 2021-08-07 — End: 2021-08-09
  Administered 2021-08-07 – 2021-08-09 (×2): 120 mg via ORAL
  Filled 2021-08-06 (×3): qty 1

## 2021-08-06 MED ORDER — OXYCODONE HCL 5 MG PO TABS
5.0000 mg | ORAL_TABLET | ORAL | Status: DC | PRN
  Administered 2021-08-08 (×2): 5 mg via ORAL
  Filled 2021-08-06 (×2): qty 1

## 2021-08-06 MED ORDER — FENTANYL CITRATE (PF) 250 MCG/5ML IJ SOLN
INTRAMUSCULAR | Status: AC
  Filled 2021-08-06: qty 5

## 2021-08-06 MED ORDER — ORAL CARE MOUTH RINSE: 15.0000 mL | Freq: Once | OROMUCOSAL | Status: AC

## 2021-08-06 MED ORDER — LIDOCAINE-EPINEPHRINE (PF) 2 %-1:200000 IJ SOLN
10.0000 mL | Freq: Once | INTRAMUSCULAR | Status: AC
  Administered 2021-08-06: 10 mL
  Filled 2021-08-06: qty 20

## 2021-08-06 MED ORDER — VANCOMYCIN VARIABLE DOSE PER UNSTABLE RENAL FUNCTION (PHARMACIST DOSING)
Status: DC
  Filled 2021-08-06: qty 1

## 2021-08-06 MED ORDER — ALBUTEROL SULFATE HFA 108 (90 BASE) MCG/ACT IN AERS: 1.0000 | INHALATION_SPRAY | Freq: Four times a day (QID) | RESPIRATORY_TRACT | Status: DC | PRN

## 2021-08-06 MED ORDER — SODIUM CHLORIDE 0.9% FLUSH
3.0000 mL | Freq: Two times a day (BID) | INTRAVENOUS | Status: DC
  Administered 2021-08-07 – 2021-08-09 (×5): 3 mL via INTRAVENOUS

## 2021-08-06 MED ORDER — SODIUM CHLORIDE 0.9 % IV SOLN
2.0000 g | Freq: Once | INTRAVENOUS | Status: DC
  Filled 2021-08-06: qty 12.5

## 2021-08-06 MED ORDER — FENTANYL CITRATE (PF) 100 MCG/2ML IJ SOLN: 25.0000 ug | INTRAMUSCULAR | Status: DC | PRN

## 2021-08-06 MED ORDER — POLYETHYLENE GLYCOL 3350 17 G PO PACK: 17.0000 g | PACK | Freq: Every day | ORAL | Status: DC | PRN

## 2021-08-06 MED ORDER — SUCCINYLCHOLINE CHLORIDE 200 MG/10ML IV SOSY
PREFILLED_SYRINGE | INTRAVENOUS | Status: DC | PRN
  Administered 2021-08-06: 140 mg via INTRAVENOUS

## 2021-08-06 MED ORDER — DEXAMETHASONE SODIUM PHOSPHATE 10 MG/ML IJ SOLN
INTRAMUSCULAR | Status: AC
  Filled 2021-08-06: qty 4

## 2021-08-06 SURGICAL SUPPLY — 23 items
BNDG GAUZE ELAST 4 BULKY (GAUZE/BANDAGES/DRESSINGS) ×1 IMPLANT
CANISTER SUCT 3000ML PPV (MISCELLANEOUS) ×2 IMPLANT
COVER SURGICAL LIGHT HANDLE (MISCELLANEOUS) ×2 IMPLANT
DRAPE LAPAROSCOPIC ABDOMINAL (DRAPES) ×1 IMPLANT
DRSG PAD ABDOMINAL 8X10 ST (GAUZE/BANDAGES/DRESSINGS) ×1 IMPLANT
ELECT REM PT RETURN 9FT ADLT (ELECTROSURGICAL) ×2 IMPLANT
ELECTRODE REM PT RTRN 9FT ADLT (ELECTROSURGICAL) ×1 IMPLANT
GAUZE SPONGE 4X4 12PLY STRL (GAUZE/BANDAGES/DRESSINGS) ×1 IMPLANT
GLOVE BIO SURGEON STRL SZ7.5 (GLOVE) ×2 IMPLANT
GLOVE INDICATOR 8.0 STRL GRN (GLOVE) ×2 IMPLANT
GOWN STRL REUS W/ TWL LRG LVL3 (GOWN DISPOSABLE) ×1 IMPLANT
GOWN STRL REUS W/ TWL XL LVL3 (GOWN DISPOSABLE) ×1 IMPLANT
GOWN STRL REUS W/TWL LRG LVL3 (GOWN DISPOSABLE) ×2
GOWN STRL REUS W/TWL XL LVL3 (GOWN DISPOSABLE) ×2
KIT BASIN OR (CUSTOM PROCEDURE TRAY) ×2 IMPLANT
KIT TURNOVER KIT B (KITS) ×2 IMPLANT
NS IRRIG 1000ML POUR BTL (IV SOLUTION) ×2 IMPLANT
PACK GENERAL/GYN (CUSTOM PROCEDURE TRAY) ×1 IMPLANT
PAD ARMBOARD 7.5X6 YLW CONV (MISCELLANEOUS) ×2 IMPLANT
SWAB COLLECTION DEVICE MRSA (MISCELLANEOUS) ×1 IMPLANT
SWAB CULTURE ESWAB REG 1ML (MISCELLANEOUS) ×1 IMPLANT
TOWEL GREEN STERILE (TOWEL DISPOSABLE) ×2 IMPLANT
UNDERPAD 30X36 HEAVY ABSORB (UNDERPADS AND DIAPERS) ×2 IMPLANT

## 2021-08-06 NOTE — Sepsis Progress Note (Signed)
Second lactic acid draw has been delayed due to patient going to the OR.

## 2021-08-06 NOTE — H&P (Addendum)
History and Physical    Patient: Suzanne Patton [id number] DOB: 06-14-1950 DOA: 08/06/2021 DOS: the patient was seen and examined on 08/06/2021 PCP: [name], DO  Patient coming from: Home - lives alone, sometimes her grandson stays with her; NOK: Brita Romp, [phone]   Chief Complaint: abdominal wall wound  HPI: Suzanne Patton is a 71 y.o. female with medical history significant of HTN, HLD, and DM presenting with an abdominal wound. She reports that she rotates her insulin injections.  Friday, she noticed an abdominal wall wound.  During the weekend, it has gotten more red and swollen and tender.  No clear fever but she has had chills and sweats.  She had COVID at the beginning of May (her 4th time) and she has felt fatigued and SOB since; she had a negative home COVID test this AM.    ER Course:  Abdominal wall abscess, lactic acidosis.  Getting broad spectrum abx.  Surgery is taking her to the OR soon.     Review of Systems: As mentioned in the history of present illness. All other systems reviewed and are negative. Past Medical History:  Diagnosis Date   CAD (coronary artery disease)    Diabetes mellitus without complication ([place])    Hyperlipidemia    Hypertension    PVD (peripheral vascular disease) ([place])    s/p carotid stent   Stage 3b chronic kidney disease (CKD) ([place])    Past Surgical History:  Procedure Laterality Date   ABLATION     CAROTID STENT     CHOLECYSTECTOMY     HYSTEROTOMY     ROTATOR CUFF REPAIR Right    Social History:  reports that she has never smoked. She has never used smokeless tobacco. She reports current alcohol use. She reports that she does not use drugs.  Allergies  Allergen Reactions   Dulaglutide Shortness Of Breath   Ozempic (0.25 Or 0.5 Mg-Dose) [Semaglutide(0.25 Or 0.5mg -Dos)] Nausea And Vomiting   Meclizine Other (See Comments)    Psychotic episode   Prednisone Other (See Comments)    hyperglycemia   Quinolones  Other (See Comments)    severe muscle aches    Family History  Problem Relation Age of Onset   Atrial fibrillation Mother    Kidney failure Mother    Heart disease Father    Heart attack Father    Heart failure Father    COPD Father     Prior to Admission medications   Medication Sig Start Date End Date Taking? Authorizing Provider  albuterol (VENTOLIN HFA) 108 (90 Base) MCG/ACT inhaler SMARTSIG:1-2 Puff(s) By Mouth 4 Times Daily PRN 08/16/20   [provider]  Ascorbic Acid (VITAMIN C ER PO) Take 1 tablet by mouth daily at 12 noon.    [provider]  aspirin 81 MG EC tablet Take 1 tablet by mouth daily.    [provider]  CALCIUM CITRATE PO Take 1 tablet by mouth daily at 12 noon.    [provider]  citalopram (CELEXA) 20 MG tablet Take 20 mg by mouth daily. 03/01/20   [provider]  clonazePAM (KLONOPIN) 0.5 MG tablet Take 0.75 mg by mouth at bedtime. 04/30/20   [provider]  diltiazem (CARDIZEM CD) 120 MG 24 hr capsule TAKE 1 CAPSULE BY MOUTH EVERY DAY 04/27/21   Cantwell, Celeste C, PA-C  ezetimibe (ZETIA) 10 MG tablet Take 10 mg by mouth daily. 06/23/20   [provider]  fluticasone (FLONASE) 50 MCG/ACT nasal spray  as needed.    [provider]  HYDROcodone-acetaminophen (NORCO) 7.5-325 MG tablet Take 1-2 tablets by mouth every 6 (six) hours as needed. 03/03/21   [provider]  ibuprofen (ADVIL) 600 MG tablet as needed.    [provider]  insulin lispro (HUMALOG) 100 UNIT/ML injection as needed. 01/16/20   [provider]  labetalol (NORMODYNE) 300 MG tablet Take 1 tablet by mouth in the morning and at bedtime.    [provider]  lamoTRIgine (LAMICTAL) 200 MG tablet Take 200 mg by mouth 2 (two) times daily. 06/27/20   [provider]  LEVEMIR 100 UNIT/ML injection 55 Units 2 (two) times daily. 04/12/20   [provider]  levocetirizine (XYZAL) 5 MG  tablet Take 5 mg by mouth every evening.    [provider]  losartan (COZAAR) 25 MG tablet TAKE 1 TABLET (25 MG TOTAL) BY MOUTH DAILY. 04/18/21 07/17/21  Patwardhan, Reynold Bowen, MD  nitroGLYCERIN (NITROSTAT) 0.4 MG SL tablet Place 1 tablet (0.4 mg total) under the tongue every 5 (five) minutes as needed for chest pain. 07/25/20 03/30/21  Patwardhan, Reynold Bowen, MD  Omega-3 Fatty Acids (FISH OIL OMEGA-3 PO) Take 2 tablets by mouth daily at 12 noon.    [provider]  omeprazole (PRILOSEC) 20 MG capsule Take 1 capsule by mouth daily. 06/23/20   [provider]  rosuvastatin (CRESTOR) 20 MG tablet Take 1 tablet (20 mg total) by mouth daily. 03/30/21 03/25/22  Cantwell, Celeste C, PA-C  Semaglutide 7 MG TABS Take 3 mg by mouth in the morning. RYBELSUS 07/19/20   [provider]    Physical Exam: Vitals:   08/06/21 1401 08/06/21 1515  BP: 139/85 136/63  Pulse: 76 72  Resp: 18 18  Temp: 99.1 F (37.3 C)   SpO2: 94% 93%   General:  Appears anxious and is in NAD Eyes:  PERRL, EOMI, normal lids, iris ENT:  grossly normal hearing, lips & tongue, mmm; appropriate dentition Neck:  no LAD, masses or thyromegaly Cardiovascular:  RRR, no m/r/g. 1+ LE edema.  Respiratory:   CTA bilaterally with no wheezes/rales/rhonchi.  Normal respiratory effort. Abdomen:  soft, superficial TTP due to wound infection Skin:  LLQ erythema with fluctuance and streaking erythema across her entire abdomen     Musculoskeletal:  grossly normal tone BUE/BLE, good ROM, no bony abnormality Psychiatric:  anxious mood and affect, speech fluent and marginally pressured, AOx3 Neurologic:  CN 2-12 grossly intact, moves all extremities in coordinated fashion   Radiological Exams on Admission: Independently reviewed - see discussion in A/P where applicable  No results found.  EKG: pending   Labs on Admission: I have personally reviewed the available labs and imaging studies at the time of the  admission.  Pertinent labs:    Glucose 230 BUN 20/Creatinine 1.67/GFR 33  Lactate 2.7 WBC 11.2 Blood culture x 1 pending   Assessment and Plan: Principal Problem:   Abdominal wall abscess Active Problems:   Mixed hyperlipidemia   Essential hypertension   OSA (obstructive sleep apnea)   Diabetes mellitus without complication (HCC)   Stage 3b chronic kidney disease (CKD) (HCC)   Mood disorder (HCC)    Abdominal wall cellulitis with abscess -Patient presenting with apparent abdominal wall asbcess as well as with streaking erythema across the abdominal wall -She was given Vanc and Zosyn in the ER; will change to Vanc and Cefepime per the cellulitis order set, -She is going to the OR soon for I&D with  Dr. Dema Severin -Will observe on telemetry due to concern for sepsis; however she currently only has 1 SIRS criteria (leukocytosis) along with elevated lactate >2 that is not easily explained by another condition. -Blood culture pending -Will trend lactate to ensure improvement  DM -Continue Levemir -Cover with moderate-scale SSI   Stage 3b CKD -Uncertain baseline but she is at stage 3b now so would assume she is close to baseline -Hold hold Cozaar for now -Will follow  HTN -Continue diltiazem, labetalol -Hold Cozaar for now  HLD -Continue Crestor, Zetia -Hold fish oil due to limited inpatient utility  CAD/PVD -s/p carotid stent -Continue ASA  Mood d/o -Continue Celexa, Klonopin, lamotrigine -Appears quite anxious (hypomanic?) now but this may be situational  OSA -Continue CPAP  Morbid obesity -Prior BMI was 40.5; will weigh today -Weight loss should be encouraged -Outpatient PCP/bariatric medicine/bariatric surgery f/u encouraged    *Note: patient is Jehovah's Witness and will not accept blood products under any circumstances.  She is full code but would not desire prolonged resuscitation or resuscitation in the event of a futile condition.    Advance Care  Planning:   Code Status: Full Code   Consults: Surgery; PT/OT tomorrow  DVT Prophylaxis: Lovenox (post-operatively)  Family Communication: None present; she is capable of communicating with family at this time  Severity of Illness: The appropriate patient status for this patient is OBSERVATION. Observation status is judged to be reasonable and necessary in order to provide the required intensity of service to ensure the patient's safety. The patient's presenting symptoms, physical exam findings, and initial radiographic and laboratory data in the context of their medical condition is felt to place them at decreased risk for further clinical deterioration. Furthermore, it is anticipated that the patient will be medically stable for discharge from the hospital within 2 midnights of admission.   Author: Karmen Bongo, MD 08/06/2021 4:24 PM  For on call review [email or web address].

## 2021-08-06 NOTE — ED Triage Notes (Signed)
C/o wound to LLQ since yesterday with chills.  States she doesn't believe it is from giving herself insulin injections.  Pt has SOB with exertion that she states is normal for her since having COVID x 4.

## 2021-08-06 NOTE — Transfer of Care (Signed)
Immediate Anesthesia Transfer of Care Note  Patient: Rayli Wiederhold  Procedure(s) Performed: IRRIGATION AND DEBRIDEMENT ABDOMINAL WALL  ABSCESS  Patient Location: PACU  Anesthesia Type:General  Level of Consciousness: awake and patient cooperative  Airway & Oxygen Therapy: Patient Spontanous Breathing and Patient connected to face mask oxygen  Post-op Assessment: Report given to RN and Post -op Vital signs reviewed and stable  Post vital signs: Reviewed and stable  Last Vitals:  Vitals Value Taken Time  BP 147/59 08/06/21 1816  Temp 38.3 C 08/06/21 1815  Pulse 72 08/06/21 1827  Resp 16 08/06/21 1827  SpO2 90 % 08/06/21 1827  Vitals shown include unvalidated device data.  Last Pain:  Vitals:   08/06/21 1815  TempSrc:   PainSc: 0-No pain         Complications: No notable events documented.

## 2021-08-06 NOTE — Consult Note (Signed)
CC: Abdominal wall abscess  Requesting Physician: Dr. Pattricia Boss, MD  HPI: Suzanne Patton is an 71 y.o. female with hx HTN, HLD, DM, CAD, COPD, CKD - presented to ED with left anterior abdominal wall redness and swelling of 2 days duration. Never had this before. IT has progressed over the last 2 days. No nausea, vomiting, diarrhea, constipation, fever/chills.   Bedside ultrasound demonstrated loculated fluid collections in subcutaneous tissue  Past Medical History:  Diagnosis Date   Diabetes mellitus without complication ([place])    Hyperlipidemia    Hypertension     Past Surgical History:  Procedure Laterality Date   ABLATION     CAROTID STENT     CHOLECYSTECTOMY     HYSTEROTOMY     ROTATOR CUFF REPAIR Right     Family History  Problem Relation Age of Onset   Atrial fibrillation Mother    Kidney failure Mother    Heart disease Father    Heart attack Father    Heart failure Father    COPD Father     Social:  reports that she has never smoked. She has never used smokeless tobacco. She reports current alcohol use. She reports that she does not use drugs.  Allergies:  Allergies  Allergen Reactions   Dulaglutide Shortness Of Breath   Meclizine Other (See Comments)   Prednisone     Other reaction(s): hyperglycemia   Quinolones     Other reaction(s): severe muscle aches    Medications: I have reviewed the patient's current medications.  Results for orders placed or performed during the hospital encounter of 08/06/21 (from the past 48 hour(s))  Lipase, blood     Status: None   Collection Time: 08/06/21  2:07 PM  Result Value Ref Range   Lipase 26 11 - 51 U/L    Comment: Performed at [facility], [place] [address] [place], [place] [address]  Comprehensive metabolic panel     Status: Abnormal   Collection Time: 08/06/21  2:07 PM  Result Value Ref Range   Sodium 139 135 - 145 mmol/L   Potassium 4.7 3.5 - 5.1 mmol/L   Chloride 103 98 - 111 mmol/L   CO2 28 22 -  32 mmol/L   Glucose, Bld 230 (H) 70 - 99 mg/dL    Comment: Glucose reference range applies only to samples taken after fasting for at least 8 hours.   BUN 20 8 - 23 mg/dL   Creatinine, Ser 1.67 (H) 0.44 - 1.00 mg/dL   Calcium 9.3 8.9 - 10.3 mg/dL   Total Protein 7.1 6.5 - 8.1 g/dL   Albumin 3.8 3.5 - 5.0 g/dL   AST 18 15 - 41 U/L   ALT 14 0 - 44 U/L   Alkaline Phosphatase 84 38 - 126 U/L   Total Bilirubin 1.0 0.3 - 1.2 mg/dL   GFR, Estimated 33 (L) >60 mL/min    Comment: (NOTE) Calculated using the CKD-EPI Creatinine Equation (2021)    Anion gap 8 5 - 15    Comment: Performed at [facility] [address], [place] [address]  CBC     Status: Abnormal   Collection Time: 08/06/21  2:07 PM  Result Value Ref Range   WBC 11.2 (H) 4.0 - 10.5 K/uL   RBC 4.30 3.87 - 5.11 MIL/uL   Hemoglobin 12.5 12.0 - 15.0 g/dL   HCT 38.2 36.0 - 46.0 %   MCV 88.8 80.0 - 100.0 fL  MCH 29.1 26.0 - 34.0 pg   MCHC 32.7 30.0 - 36.0 g/dL   RDW 97.7 41.4 - 23.9 %   Platelets 218 150 - 400 K/uL   nRBC 0.0 0.0 - 0.2 %    Comment: Performed at [facility], [address], [place], [place] [address]  Lactic acid, plasma     Status: Abnormal   Collection Time: 08/06/21  2:07 PM  Result Value Ref Range   Lactic Acid, Venous 2.7 (HH) 0.5 - 1.9 mmol/L    Comment: CRITICAL RESULT CALLED TO, READ BACK BY AND VERIFIED WITH: M.HARDY,RN 08/06/2021 AT 1442 A.HUGHES Performed at [facility], [address] [place], [place] [address]     No results found.  ROS - all of the below systems have been reviewed with the patient and positives are indicated with bold text General: chills, fever or night sweats Eyes: blurry vision or double vision ENT: epistaxis or sore throat Allergy/Immunology: itchy/watery eyes or nasal congestion Hematologic/Lymphatic: bleeding problems, blood clots or swollen lymph nodes Endocrine: temperature intolerance or unexpected weight changes Breast: new or  changing breast lumps or nipple discharge Resp: cough, shortness of breath, or wheezing CV: chest pain or dyspnea on exertion GI: as per HPI GU: dysuria, trouble voiding, or hematuria MSK: joint pain or joint stiffness Neuro: TIA or stroke symptoms Derm: pruritus and skin lesion changes Psych: anxiety and depression  PE Blood pressure 136/63, pulse 72, temperature 99.1 F (37.3 C), resp. rate 18, SpO2 93 %. Constitutional: NAD; conversant Eyes: Moist conjunctiva; no lid lag; anicteric Lungs: Normal respiratory effort; CV: RRR GI: Abd soft, NT/ND; left abdominal wall with erythema and fluctuance over area of ~10 x 10 cm. No spontaneously draining pus.  MSK: Normal range of motion of extremities Psychiatric: Appropriate affect; alert and oriented x3  Results for orders placed or performed during the hospital encounter of 08/06/21 (from the past 48 hour(s))  Lipase, blood     Status: None   Collection Time: 08/06/21  2:07 PM  Result Value Ref Range   Lipase 26 11 - 51 U/L    Comment: Performed at [facility], [address], [place], [place] [address]  Comprehensive metabolic panel     Status: Abnormal   Collection Time: 08/06/21  2:07 PM  Result Value Ref Range   Sodium 139 135 - 145 mmol/L   Potassium 4.7 3.5 - 5.1 mmol/L   Chloride 103 98 - 111 mmol/L   CO2 28 22 - 32 mmol/L   Glucose, Bld 230 (H) 70 - 99 mg/dL    Comment: Glucose reference range applies only to samples taken after fasting for at least 8 hours.   BUN 20 8 - 23 mg/dL   Creatinine, Ser 8.37 (H) 0.44 - 1.00 mg/dL   Calcium 9.3 8.9 - 29.0 mg/dL   Total Protein 7.1 6.5 - 8.1 g/dL   Albumin 3.8 3.5 - 5.0 g/dL   AST 18 15 - 41 U/L   ALT 14 0 - 44 U/L   Alkaline Phosphatase 84 38 - 126 U/L   Total Bilirubin 1.0 0.3 - 1.2 mg/dL   GFR, Estimated 33 (L) >60 mL/min    Comment: (NOTE) Calculated using the CKD-EPI Creatinine Equation (2021)    Anion gap 8 5 - 15    Comment: Performed at [facility]  [facility], [address], [place], [place] [address]  CBC     Status: Abnormal   Collection Time: 08/06/21  2:07 PM  Result Value Ref Range   WBC 11.2 (H) 4.0 - 10.5 K/uL   RBC 4.30 3.87 - 5.11 MIL/uL   Hemoglobin 12.5 12.0 - 15.0 g/dL   HCT 38.2 36.0 - 46.0 %   MCV 88.8 80.0 - 100.0 fL   MCH 29.1 26.0 - 34.0 pg   MCHC 32.7 30.0 - 36.0 g/dL   RDW 13.2 11.5 - 15.5 %   Platelets 218 150 - 400 K/uL   nRBC 0.0 0.0 - 0.2 %    Comment: Performed at [facility], [place] [address], [place], [place] [address]  Lactic acid, plasma     Status: Abnormal   Collection Time: 08/06/21  2:07 PM  Result Value Ref Range   Lactic Acid, Venous 2.7 (HH) 0.5 - 1.9 mmol/L    Comment: CRITICAL RESULT CALLED TO, READ BACK BY AND VERIFIED WITH: M.HARDY,RN 08/06/2021 AT [id number] A.HUGHES Performed at [facility], [place] [address], [place], [place] [address]     No results found.  I have personally reviewed the relevant CBC, CMP, lactate 08/06/21  A/P: Suzanne Patton is an 71 y.o. female with HTN, HLD, DM, CAD, COPD, CKD  - here with abdominal wall abscess   She reports she is Jehovah's Witness and would not want a transfusion even if life threatening scenario  -IVF, NPO -Empiric IV abx for cellulitis coverage -We will follow with you  -Plan for OR for incision/drainage of abdominal wall fluid collection for source control. Large enough that I do not think she would tolerate attempts at bedside drainage nor would we likely get it all drained... therefore we discussed incision/drainage of abdominal wall abscess under anesthesia  -We reviewed the anatomy of the abdominal wall. -We reviewed that we would plan to open the skin over the infection, drain the pus and break up all loculations. We discussed that the wound would likely be opened to the extent to allow it to heal, possibly with packing material. We reviewed general wound expectations and potential for dressings/packing for the coming weeks -The planned  procedures, material risks (including, but not limited to, pain, bleeding, infection, scarring, worsening of pre-existing medical conditions, recurrence, pneumonia, heart attack, stroke, death) benefits and alternatives to surgery were discussed at length. The patient's questions were answered to her satisfaction, she voiced understanding and elected to proceed with surgery. Additionally, we discussed typical postoperative expectations and the recovery process.  Timing of procedure later today vs tomorrow largely based on OR availability and resources  I spent a total of 60 minutes in both face-to-face and non-face-to-face activities, excluding procedures performed, for this visit on the date of this encounter  Nadeen Landau, [facility], [place]

## 2021-08-06 NOTE — Anesthesia Procedure Notes (Signed)
Procedure Name: Intubation Date/Time: 08/06/2021 5:38 PM Performed by: Janene Harvey, CRNA Pre-anesthesia Checklist: Patient identified, Emergency Drugs available, Suction available and Patient being monitored Patient Re-evaluated:Patient Re-evaluated prior to induction Oxygen Delivery Method: Circle system utilized Preoxygenation: Pre-oxygenation with 100% oxygen Induction Type: IV induction Ventilation: Mask ventilation without difficulty and Oral airway inserted - appropriate to patient size Laryngoscope Size: Mac and 4 Grade View: Grade II Tube size: 7.0 mm Number of attempts: 1 Placement Confirmation: ETT inserted through vocal cords under direct vision, CO2 detector and breath sounds checked- equal and bilateral Secured at: 21 cm Tube secured with: Tape Dental Injury: Teeth and Oropharynx as per pre-operative assessment

## 2021-08-06 NOTE — ED Provider Notes (Signed)
[place] EMERGENCY DEPARTMENT Provider Note   CSN: [id number] Arrival date & time: 08/06/21  1348     History  Chief Complaint  Patient presents with   Abscess    Suzanne Patton is a 71 y.o. female.  HPI 71 year old female with history of diabetes, CKD, coronary artery disease, presents today complaining of pain, swelling, redness to abdominal wall.  She states that symptoms Friday night.  She noticed some induration, warmth, and pain.  She had chills yesterday.  Redness has increased yesterday.  She denies nausea, vomiting, diarrhea, cough, urinary tract infection symptoms    Home Medications Prior to Admission medications   Medication Sig Start Date End Date Taking? Authorizing Provider  albuterol (VENTOLIN HFA) 108 (90 Base) MCG/ACT inhaler SMARTSIG:1-2 Puff(s) By Mouth 4 Times Daily PRN 08/16/20   [provider]  Ascorbic Acid (VITAMIN C ER PO) Take 1 tablet by mouth daily at 12 noon.    [provider]  aspirin 81 MG EC tablet Take 1 tablet by mouth daily.    [provider]  CALCIUM CITRATE PO Take 1 tablet by mouth daily at 12 noon.    [provider]  citalopram (CELEXA) 20 MG tablet Take 20 mg by mouth daily. 03/01/20   [provider]  clonazePAM (KLONOPIN) 0.5 MG tablet Take 0.75 mg by mouth at bedtime. 04/30/20   [provider]  diltiazem (CARDIZEM CD) 120 MG 24 hr capsule TAKE 1 CAPSULE BY MOUTH EVERY DAY 04/27/21   Cantwell, Celeste C, PA-C  ezetimibe (ZETIA) 10 MG tablet Take 10 mg by mouth daily. 06/23/20   [provider]  fluticasone (FLONASE) 50 MCG/ACT nasal spray as needed.    [provider]  HYDROcodone-acetaminophen (NORCO) 7.5-325 MG tablet Take 1-2 tablets by mouth every 6 (six) hours as needed. 03/03/21   [provider]  ibuprofen (ADVIL) 600 MG tablet as needed.    [provider]  insulin lispro (HUMALOG) 100 UNIT/ML injection as needed. 01/16/20    [provider]  labetalol (NORMODYNE) 300 MG tablet Take 1 tablet by mouth in the morning and at bedtime.    [provider]  lamoTRIgine (LAMICTAL) 200 MG tablet Take 200 mg by mouth 2 (two) times daily. 06/27/20   [provider]  LEVEMIR 100 UNIT/ML injection 55 Units 2 (two) times daily. 04/12/20   [provider]  levocetirizine (XYZAL) 5 MG tablet Take 5 mg by mouth every evening.    [provider]  losartan (COZAAR) 25 MG tablet TAKE 1 TABLET (25 MG TOTAL) BY MOUTH DAILY. 04/18/21 07/17/21  Patwardhan, Reynold Bowen, MD  nitroGLYCERIN (NITROSTAT) 0.4 MG SL tablet Place 1 tablet (0.4 mg total) under the tongue every 5 (five) minutes as needed for chest pain. 07/25/20 03/30/21  Patwardhan, Reynold Bowen, MD  Omega-3 Fatty Acids (FISH OIL OMEGA-3 PO) Take 2 tablets by mouth daily at 12 noon.    [provider]  omeprazole (PRILOSEC) 20 MG capsule Take 1 capsule by mouth daily. 06/23/20   [provider]  rosuvastatin (CRESTOR) 20 MG tablet Take 1 tablet (20 mg total) by mouth daily. 03/30/21 03/25/22  Cantwell, Celeste C, PA-C  Semaglutide 7 MG TABS Take 3 mg by mouth in the morning. RYBELSUS 07/19/20   [provider]      Allergies    Dulaglutide, Meclizine, Prednisone, and Quinolones    Review of Systems   Review of Systems  Physical Exam Updated Vital Signs BP 136/63 (  BP Location: Right Arm)   Pulse 72   Temp 99.1 F (37.3 C)   Resp 18   SpO2 93%  Physical Exam Vitals and nursing note reviewed.  Constitutional:      General: She is not in acute distress.    Appearance: Normal appearance. She is obese. She is not ill-appearing.  HENT:     Head: Normocephalic.     Right Ear: External ear normal.     Left Ear: External ear normal.     Nose: Nose normal.     Mouth/Throat:     Pharynx: Oropharynx is clear.  Eyes:     Pupils: Pupils are equal, round, and reactive to light.  Cardiovascular:     Rate and Rhythm: Normal rate  and regular rhythm.  Pulmonary:     Effort: Pulmonary effort is normal.     Breath sounds: Normal breath sounds.  Abdominal:     General: Abdomen is flat. Bowel sounds are normal.     Comments: Warm, firm, indurated area to left lower abdomen that is slightly tender There is some red streaking around the area Abdomen is soft and nontender with the exception of the above area  Musculoskeletal:        General: No swelling or tenderness. Normal range of motion.     Cervical back: Normal range of motion.     Left lower leg: No edema.  Skin:    General: Skin is warm and dry.     Capillary Refill: Capillary refill takes less than 2 seconds.     Findings: Lesion and rash present.  Neurological:     General: No focal deficit present.     Mental Status: She is alert.  Psychiatric:        Mood and Affect: Mood normal.    ED Results / Procedures / Treatments   Labs (all labs ordered are listed, but only abnormal results are displayed) Labs Reviewed  COMPREHENSIVE METABOLIC PANEL - Abnormal; Notable for the following components:      Result Value   Glucose, Bld 230 (*)    Creatinine, Ser 1.67 (*)    GFR, Estimated 33 (*)    All other components within normal limits  CBC - Abnormal; Notable for the following components:   WBC 11.2 (*)    All other components within normal limits  LACTIC ACID, PLASMA - Abnormal; Notable for the following components:   Lactic Acid, Venous 2.7 (*)    All other components within normal limits  CULTURE, BLOOD (SINGLE)  LIPASE, BLOOD  LACTIC ACID, PLASMA    EKG None  Radiology No results found.  Procedures .Critical Care Performed by: Pattricia Boss, MD Authorized by: Pattricia Boss, MD   Critical care provider statement:    Critical care time (minutes):  30   Critical care end time:  08/06/2021 3:45 PM   Critical care was necessary to treat or prevent imminent or life-threatening deterioration of the following conditions:  Sepsis   Critical  care was time spent personally by me on the following activities:  Development of treatment plan with patient or surrogate, discussions with consultants, evaluation of patient's response to treatment, examination of patient, ordering and review of laboratory studies, ordering and review of radiographic studies, ordering and performing treatments and interventions, pulse oximetry, re-evaluation of patient's condition and review of old charts    Medications Ordered in ED Medications  piperacillin-tazobactam (ZOSYN) IVPB 3.375 g (3.375 g Intravenous New Bag/Given 08/06/21 1524)  lactated ringers  infusion (has no administration in time range)  lactated ringers bolus 500 mL (has no administration in time range)  vancomycin (VANCOREADY) IVPB 2000 mg/400 mL (has no administration in time range)  lidocaine-EPINEPHrine (XYLOCAINE W/EPI) 2 %-1:200000 (PF) injection 10 mL (10 mLs Infiltration Given 08/06/21 1445)    ED Course/ Medical Decision Making/ A&P Clinical Course as of 08/06/21 1545  Sun Aug 06, 2021  1454 Lactic reviewed Antibiotics started Surgery paged [DR]  1527 Lactic acid, plasma(!!) Lactic acid reviewed interpreted and elevated CBC reviewed interpreted with normal hemoglobin and platelets and slightly elevated white blood cell count Patient metabolic panel reviewed and interpreted with hyperglycemia at 230 no evidence of DKA Creatinine is 1.6 no old available at this time [DR]    Clinical Course User Index [DR] Pattricia Boss, MD                           Medical Decision Making 71 year old female history of type 2 diabetes, coronary artery disease, CKD, presents today complaining of pain, swelling, redness anterior abdominal wall.  Patient states symptoms began on Friday and are worsening.  She has not had any fever but has had chills at home. On exam patient has redness, induration, tenderness of abdominal wall Bedside ultrasound done an extensive loculated abscess noted Patient  with spreading erythema across the abdominal wall Leukocytosis and elevated lactic Patient has received IV fluids, IV antibiotics Discussed with Dr. Dema Severin, on-call for general surgery and he is seeing and evaluating patient. Discussed with Dr. Lorin Mercy, on-call for hospitalist who will see for admission and medical management   Amount and/or Complexity of Data Reviewed Labs: ordered. Decision-making details documented in ED Course.  Risk Prescription drug management.         Final Clinical Impression(s) / ED Diagnoses Final diagnoses:  Abdominal wall abscess  Sepsis without acute organ dysfunction, due to unspecified organism [facility])    Rx / DC Orders ED Discharge Orders     None         Pattricia Boss, MD 08/06/21 1545

## 2021-08-06 NOTE — Anesthesia Preprocedure Evaluation (Addendum)
Anesthesia Evaluation  Patient identified by MRN, date of birth, ID band Patient awake    Reviewed: Allergy & Precautions, H&P , NPO status , Patient's Chart, lab work & pertinent test results  Airway Mallampati: III  TM Distance: >3 FB Neck ROM: Full    Dental no notable dental hx. (+) Teeth Intact, Dental Advisory Given   Pulmonary sleep apnea ,    Pulmonary exam normal breath sounds clear to auscultation       Cardiovascular hypertension, Pt. on medications + Peripheral Vascular Disease   Rhythm:Regular Rate:Normal     Neuro/Psych negative neurological ROS  negative psych ROS   GI/Hepatic negative GI ROS, Neg liver ROS,   Endo/Other  diabetes, Insulin Dependent  Renal/GU negative Renal ROS  negative genitourinary   Musculoskeletal   Abdominal   Peds  Hematology negative hematology ROS (+)   Anesthesia Other Findings   Reproductive/Obstetrics negative OB ROS                            Anesthesia Physical Anesthesia Plan  ASA: 3  Anesthesia Plan: General   Post-op Pain Management: Ofirmev IV (intra-op)*   Induction: Intravenous  PONV Risk Score and Plan: 4 or greater and Ondansetron and Treatment may vary due to age or medical condition  Airway Management Planned: Oral ETT  Additional Equipment:   Intra-op Plan:   Post-operative Plan: Extubation in OR  Informed Consent: I have reviewed the patients History and Physical, chart, labs and discussed the procedure including the risks, benefits and alternatives for the proposed anesthesia with the patient or authorized representative who has indicated his/her understanding and acceptance.     Dental advisory given  Plan Discussed with: CRNA  Anesthesia Plan Comments:         Anesthesia Quick Evaluation

## 2021-08-06 NOTE — Anesthesia Postprocedure Evaluation (Signed)
Anesthesia Post Note  Patient: Suzanne Patton  Procedure(s) Performed: IRRIGATION AND DEBRIDEMENT ABDOMINAL WALL  ABSCESS (Abdomen)     Patient location during evaluation: PACU Anesthesia Type: General Level of consciousness: awake and alert Pain management: pain level controlled Vital Signs Assessment: post-procedure vital signs reviewed and stable Respiratory status: spontaneous breathing, nonlabored ventilation, respiratory function stable and patient connected to nasal cannula oxygen Cardiovascular status: blood pressure returned to baseline and stable Postop Assessment: no apparent nausea or vomiting Anesthetic complications: no   No notable events documented.  Last Vitals:  Vitals:   08/06/21 1702 08/06/21 1815  BP:  (!) 147/59  Pulse:  71  Resp:    Temp: 37.9 C (!) 38.3 C  SpO2:  95%    Last Pain:  Vitals:   08/06/21 1815  TempSrc:   PainSc: 0-No pain                 Shalika Arntz,W. EDMOND

## 2021-08-06 NOTE — Progress Notes (Signed)
Blood Refusal form faxed to [facility]. 

## 2021-08-06 NOTE — Op Note (Signed)
08/06/2021  5:59 PM  PATIENT:  Suzanne Patton  71 y.o. female  Patient Care Team: Irena Reichmann, DO as PCP - General (Family Medicine)  PRE-OPERATIVE DIAGNOSIS:  Abdominal wall abscess  POST-OPERATIVE DIAGNOSIS:  Same  PROCEDURE: Incision and drainage of abdominal wall abscess, placement of Penrose drain  SURGEON:  Stephanie Coup. Tifany Hirsch, MD  ASSISTANT: OR staff  ANESTHESIA:   general  COUNTS: All sponge, needle, and instrument counts were reported correct.  EBL: 2 mL  DRAINS: 1/4 inch Penrose drain  SPECIMEN: No specimen but we did send wound cultures to lab for Gram stain and culture.  COMPLICATIONS: none  FINDINGS: Left-sided abdominal wall abscess which is fluctuant.  This was incised and we drained approximately 30 cc of foul-smelling, purulent fluid purplish red in color.  Cultures were obtained.  Counterincision was created and 1/4 inch Penrose was placed to facilitate wound care and avoid the need for long-term wound packing.  DISPOSITION: PACU in satisfactory condition  DESCRIPTION: The patient was identified in preop holding and taken to the OR where she was placed on the operating room table. SCDs were placed. General endotracheal anesthesia was induced without difficulty.  All pressure points were evaluated and padded.  She was then prepped and draped in the usual sterile fashion. A surgical timeout was performed indicating the correct patient, procedure, positioning and need for preoperative antibiotics.   The left lower abdominal wall abscess was readily identified.  It is erythematous and approximately 10 x 10 cm in size.  On the medial aspect of this, an incision is created and we immediately encountered copiously draining pus.  Cultures were obtained and passed off.  All pus was fully evacuated this totaled approximately 30 cc.  All loculations were bluntly explored/broken up.  The wound was then irrigated and hemostasis verified.  Abscess cavity is rather large and  wound care will likely be difficult.  So as to avoid a larger wound burden, we opted to create a counterincision on the opposite side of the wound.  1/4 inch Penrose was then passed and secured to itself with 0 silk suture.  This will ideally assist in wound care moving forward.  The wound was then irrigated and hemostasis verified.  All counts were reported correct.  To assist in maintenance of hemostasis, the wound was packed with a single moist Kerlix.  A dressing consisting of 4 x 4's, ABD, and tape was placed.  She was then awakened from anesthesia, extubated, and transferred to a stretcher for transport to PACU in satisfactory condition.

## 2021-08-06 NOTE — Progress Notes (Signed)
Pacu RN Report to floor given  Gave report to  [employer]. Room: 6N28.  Discussed surgery, meds given in OR and Pacu, VS, IV fluids given, EBL, urine output, pain and other pertinent information. Also discussed if pt had any family or friends here or belongings with them.   Vanco 2gm is going. Penrose drain is part of dressing.  Pt exits my care.

## 2021-08-06 NOTE — Sepsis Progress Note (Signed)
Sepsis protocol is being followed by [facility]. 

## 2021-08-06 NOTE — Progress Notes (Addendum)
Pharmacy Antibiotic Note  Suzanne Patton is a 71 y.o. female for which pharmacy has been consulted for cefepime and vancomycin dosing for sepsis.  Patient with a history of  HTN, HLD, and DM . Patient presenting with abdominal wound.  SCr 1.67 - unk BL WBC 11.2; LA 2.7; T 100.3 F; HR 72; RR 18  Plan: Cefepime 2g q12hr Vancomycin 2000 mg once then 1750 mg q48hr (eAUC 479) unless change in renal function Trend WBC, Fever, Renal function, & Clinical course F/u cultures, clinical course, WBC, fever De-escalate when able     Temp (24hrs), Avg:99.1 F (37.3 C), Min:99.1 F (37.3 C), Max:99.1 F (37.3 C)  Recent Labs  Lab 08/06/21 1407  WBC 11.2*  CREATININE 1.67*  LATICACIDVEN 2.7*    CrCl cannot be calculated (Unknown ideal weight.).    Allergies  Allergen Reactions   Dulaglutide Shortness Of Breath   Meclizine Other (See Comments)   Prednisone     Other reaction(s): hyperglycemia   Quinolones     Other reaction(s): severe muscle aches    Antimicrobials this admission: cefepime 5/21 >>  vancomycin 5/21 >>  Zosyn x 1 in ED 5/21  Microbiology results: Pending  Thank you for allowing pharmacy to be a part of this patient's care.  Delmar Landau, PharmD, BCPS 08/06/2021 2:56 PM ED Clinical Pharmacist -  [phone]

## 2021-08-07 ENCOUNTER — Encounter (HOSPITAL_COMMUNITY): Payer: Self-pay | Admitting: Surgery

## 2021-08-07 DIAGNOSIS — F39 Unspecified mood [affective] disorder: Secondary | ICD-10-CM | POA: Diagnosis present

## 2021-08-07 DIAGNOSIS — J449 Chronic obstructive pulmonary disease, unspecified: Secondary | ICD-10-CM | POA: Diagnosis present

## 2021-08-07 DIAGNOSIS — Z8249 Family history of ischemic heart disease and other diseases of the circulatory system: Secondary | ICD-10-CM | POA: Diagnosis not present

## 2021-08-07 DIAGNOSIS — Z79899 Other long term (current) drug therapy: Secondary | ICD-10-CM | POA: Diagnosis not present

## 2021-08-07 DIAGNOSIS — Z841 Family history of disorders of kidney and ureter: Secondary | ICD-10-CM | POA: Diagnosis not present

## 2021-08-07 DIAGNOSIS — E1151 Type 2 diabetes mellitus with diabetic peripheral angiopathy without gangrene: Secondary | ICD-10-CM | POA: Diagnosis present

## 2021-08-07 DIAGNOSIS — E872 Acidosis, unspecified: Secondary | ICD-10-CM | POA: Diagnosis present

## 2021-08-07 DIAGNOSIS — Z8616 Personal history of COVID-19: Secondary | ICD-10-CM | POA: Diagnosis not present

## 2021-08-07 DIAGNOSIS — Z794 Long term (current) use of insulin: Secondary | ICD-10-CM | POA: Diagnosis not present

## 2021-08-07 DIAGNOSIS — L02211 Cutaneous abscess of abdominal wall: Secondary | ICD-10-CM | POA: Diagnosis not present

## 2021-08-07 DIAGNOSIS — I10 Essential (primary) hypertension: Secondary | ICD-10-CM | POA: Diagnosis not present

## 2021-08-07 DIAGNOSIS — Z825 Family history of asthma and other chronic lower respiratory diseases: Secondary | ICD-10-CM | POA: Diagnosis not present

## 2021-08-07 DIAGNOSIS — Z888 Allergy status to other drugs, medicaments and biological substances status: Secondary | ICD-10-CM | POA: Diagnosis not present

## 2021-08-07 DIAGNOSIS — Z955 Presence of coronary angioplasty implant and graft: Secondary | ICD-10-CM | POA: Diagnosis not present

## 2021-08-07 DIAGNOSIS — L03311 Cellulitis of abdominal wall: Secondary | ICD-10-CM | POA: Diagnosis present

## 2021-08-07 DIAGNOSIS — I129 Hypertensive chronic kidney disease with stage 1 through stage 4 chronic kidney disease, or unspecified chronic kidney disease: Secondary | ICD-10-CM | POA: Diagnosis present

## 2021-08-07 DIAGNOSIS — G4733 Obstructive sleep apnea (adult) (pediatric): Secondary | ICD-10-CM | POA: Diagnosis present

## 2021-08-07 DIAGNOSIS — Z7982 Long term (current) use of aspirin: Secondary | ICD-10-CM | POA: Diagnosis not present

## 2021-08-07 DIAGNOSIS — Z9049 Acquired absence of other specified parts of digestive tract: Secondary | ICD-10-CM | POA: Diagnosis not present

## 2021-08-07 DIAGNOSIS — N1832 Chronic kidney disease, stage 3b: Secondary | ICD-10-CM | POA: Diagnosis present

## 2021-08-07 DIAGNOSIS — I2510 Atherosclerotic heart disease of native coronary artery without angina pectoris: Secondary | ICD-10-CM | POA: Diagnosis present

## 2021-08-07 DIAGNOSIS — E782 Mixed hyperlipidemia: Secondary | ICD-10-CM | POA: Diagnosis present

## 2021-08-07 DIAGNOSIS — Z6841 Body Mass Index (BMI) 40.0 and over, adult: Secondary | ICD-10-CM | POA: Diagnosis not present

## 2021-08-07 DIAGNOSIS — E1122 Type 2 diabetes mellitus with diabetic chronic kidney disease: Secondary | ICD-10-CM | POA: Diagnosis present

## 2021-08-07 LAB — BASIC METABOLIC PANEL
Anion gap: 8 (ref 5–15)
Anion gap: 9 (ref 5–15)
BUN: 23 mg/dL (ref 8–23)
BUN: 28 mg/dL — ABNORMAL HIGH (ref 8–23)
CO2: 23 mmol/L (ref 22–32)
CO2: 24 mmol/L (ref 22–32)
Calcium: 8.3 mg/dL — ABNORMAL LOW (ref 8.9–10.3)
Calcium: 8.9 mg/dL (ref 8.9–10.3)
Chloride: 104 mmol/L (ref 98–111)
Chloride: 105 mmol/L (ref 98–111)
Creatinine, Ser: 1.66 mg/dL — ABNORMAL HIGH (ref 0.44–1.00)
Creatinine, Ser: 1.68 mg/dL — ABNORMAL HIGH (ref 0.44–1.00)
GFR, Estimated: 32 mL/min — ABNORMAL LOW (ref >60)
GFR, Estimated: 33 mL/min — ABNORMAL LOW (ref >60)
Glucose, Bld: 177 mg/dL — ABNORMAL HIGH (ref 70–99)
Glucose, Bld: 400 mg/dL — ABNORMAL HIGH (ref 70–99)
Potassium: 5.2 mmol/L — ABNORMAL HIGH (ref 3.5–5.1)
Potassium: 5.3 mmol/L — ABNORMAL HIGH (ref 3.5–5.1)
Sodium: 136 mmol/L (ref 135–145)
Sodium: 137 mmol/L (ref 135–145)

## 2021-08-07 LAB — LACTIC ACID, PLASMA: Lactic Acid, Venous: 0.7 mmol/L (ref 0.5–1.9)

## 2021-08-07 LAB — CBC
HCT: 33.3 % — ABNORMAL LOW (ref 36.0–46.0)
Hemoglobin: 10.7 g/dL — ABNORMAL LOW (ref 12.0–15.0)
MCH: 28.8 pg (ref 26.0–34.0)
MCHC: 32.1 g/dL (ref 30.0–36.0)
MCV: 89.8 fL (ref 80.0–100.0)
Platelets: 200 10*3/uL (ref 150–400)
RBC: 3.71 MIL/uL — ABNORMAL LOW (ref 3.87–5.11)
RDW: 13.2 % (ref 11.5–15.5)
WBC: 11.1 10*3/uL — ABNORMAL HIGH (ref 4.0–10.5)
nRBC: 0 % (ref 0.0–0.2)

## 2021-08-07 LAB — GLUCOSE, CAPILLARY
Glucose-Capillary: 516 mg/dL (ref 70–99)
Glucose-Capillary: 459 mg/dL — ABNORMAL HIGH (ref 70–99)
Glucose-Capillary: 402 mg/dL — ABNORMAL HIGH (ref 70–99)
Glucose-Capillary: 302 mg/dL — ABNORMAL HIGH (ref 70–99)

## 2021-08-07 MED ORDER — INSULIN ASPART 100 UNIT/ML IJ SOLN
10.0000 [IU] | Freq: Three times a day (TID) | INTRAMUSCULAR | Status: DC
  Administered 2021-08-07 – 2021-08-09 (×7): 10 [IU] via SUBCUTANEOUS

## 2021-08-07 MED ORDER — ENOXAPARIN SODIUM 60 MG/0.6ML IJ SOSY
60.0000 mg | PREFILLED_SYRINGE | INTRAMUSCULAR | Status: DC
  Administered 2021-08-07 – 2021-08-08 (×2): 60 mg via SUBCUTANEOUS
  Filled 2021-08-07 (×2): qty 0.6

## 2021-08-07 MED ORDER — INSULIN ASPART 100 UNIT/ML IJ SOLN
0.0000 [IU] | Freq: Three times a day (TID) | INTRAMUSCULAR | Status: DC
  Administered 2021-08-07 (×2): 15 [IU] via SUBCUTANEOUS
  Administered 2021-08-08: 3 [IU] via SUBCUTANEOUS
  Administered 2021-08-08: 2 [IU] via SUBCUTANEOUS
  Administered 2021-08-08: 5 [IU] via SUBCUTANEOUS

## 2021-08-07 MED ORDER — INSULIN ASPART 100 UNIT/ML IJ SOLN
0.0000 [IU] | Freq: Every day | INTRAMUSCULAR | Status: DC
  Administered 2021-08-07: 4 [IU] via SUBCUTANEOUS
  Administered 2021-08-08: 1 [IU] via SUBCUTANEOUS

## 2021-08-07 NOTE — Evaluation (Signed)
Occupational Therapy Evaluation Patient Details Name: Suzanne Patton MRN: [id number] DOB: 05/09/1950 Today's Date: 08/07/2021   History of Present Illness Pt is a 71 y/o female who presents s/p abdominal wound since Friday 5/19. She is now s/p I&D on 5/21 with general surgery. PMH significant for HTN, DM, CAD, PVD, CKD III, R rotator cuff repair.   Clinical Impression   PT admitted with cellulitis of abdominal wound with I and D. Pt currently with functional limitiations due to the deficits listed below (see OT problem list). Pt with balance deficits on admission and reports seeing outpatient PT at baseline. Pt using RW at baseline. Pt needs cues for safety with RW. OT to address ADLS with balance deficits this admission.  Pt will benefit from skilled OT to increase their independence and safety with adls and balance to allow discharge HHOT.       Recommendations for follow up therapy are one component of a multi-disciplinary discharge planning process, led by the attending physician.  Recommendations may be updated based on patient status, additional functional criteria and insurance authorization.   Follow Up Recommendations  Home health OT    Assistance Recommended at Discharge Intermittent Supervision/Assistance  Patient can return home with the following A little help with walking and/or transfers;A little help with bathing/dressing/bathroom    Functional Status Assessment  Patient has had a recent decline in their functional status and demonstrates the ability to make significant improvements in function in a reasonable and predictable amount of time.  Equipment Recommendations  Tub/shower bench (reports having a Ucard with funds)    Recommendations for Other Services PT consult (cognitive)     Precautions / Restrictions Precautions Precautions: Fall Precaution Comments: abdominal incision Restrictions Weight Bearing Restrictions: No      Mobility Bed Mobility                General bed mobility comments: oob in chair    Transfers Overall transfer level: Needs assistance Equipment used: Rolling walker (2 wheels) Transfers: Sit to/from Stand Sit to Stand: Min guard           General transfer comment: pt requires min vc for hand placement to push up into standing. pt with poor problem solving hand placement initially      Balance Overall balance assessment: Mild deficits observed, not formally tested                                         ADL either performed or assessed with clinical judgement   ADL Overall ADL's : Needs assistance/impaired Eating/Feeding: Set up   Grooming: Wash/dry hands;Min guard Grooming Details (indicate cue type and reason): cues for rw positioning and safety             Lower Body Dressing: Min guard;Sitting/lateral leans (able to cross bil LE)   Toilet Transfer: Min guard;Regular Toilet;Rolling walker (2 wheels);Grab bars   Toileting- Clothing Manipulation and Hygiene: Min Aeronautical engineerguard;Sitting/lateral lean     Tub/Shower Transfer Details (indicate cue type and reason): simulated shower and pt braces against the wall to sustain static standing. pt has fall strategies due to near falls. pt using soap dish as a handle. Pt could greatly benefit from bench Functional mobility during ADLs: Min guard;Rolling walker (2 wheels) General ADL Comments: pt with steps backward with RW use at times during transfers. pt wtih min (A) x1 during session to correct  Vision Baseline Vision/History: 1 Wears glasses Ability to See in Adequate Light: 0 Adequate Additional Comments: glasses are broken on the R side and are a fall risk when pt reaches to adjust them with bil UE     Perception     Praxis      Pertinent Vitals/Pain Pain Assessment Pain Assessment: No/denies pain     Hand Dominance Right   Extremity/Trunk Assessment Upper Extremity Assessment Upper Extremity Assessment: Overall WFL for tasks  assessed   Lower Extremity Assessment Lower Extremity Assessment: Defer to PT evaluation   Cervical / Trunk Assessment Cervical / Trunk Assessment: Normal   Communication Communication Communication: No difficulties   Cognition Arousal/Alertness: Awake/alert Behavior During Therapy: Restless Overall Cognitive Status: Impaired/Different from baseline Area of Impairment: Safety/judgement                         Safety/Judgement: Decreased awareness of safety, Decreased awareness of deficits     General Comments: Pt reports having balance deficits and giving detailed description of fall at [facility] 4/25 with broken glasses. pt reports always falling backward and cueing therapist "i am going to go backward"     General Comments       Exercises     Shoulder Instructions      Home Living Family/patient expects to be discharged to:: Private residence Living Arrangements: Alone Available Help at Discharge: Family;Available PRN/intermittently Type of Home: Apartment Home Access: Stairs to enter Entrance Stairs-Number of Steps: 7 Entrance Stairs-Rails: Right;Left;Can reach both Home Layout: One level     Bathroom Shower/Tub: Chief Strategy Officer: Standard     Home Equipment: Agricultural consultant (2 wheels);Cane - single point   Additional Comments: reports using soap dish as a gra bar. Pt drives and does all appointments. pt drives to pick up 86 yo grandson. Daughter and pt share a care. Daughter going to school and Benedetto Goad drives with car at times.      Prior Functioning/Environment Prior Level of Function : Independent/Modified Independent             Mobility Comments: frequent falls/slips/trips. Drives, takes her grandson to work and school (he is 16). Does not use an AD despite frequent falls. Pt currently in outpatient PT for balance training and strengthening. ADLs Comments: Pt reports independence with ADL's        OT Problem List: Impaired  balance (sitting and/or standing);Decreased safety awareness;Decreased knowledge of precautions      OT Treatment/Interventions: Self-care/ADL training;Energy conservation;Therapeutic exercise;DME and/or AE instruction;Therapeutic activities;Patient/family education;Balance training    OT Goals(Current goals can be found in the care plan section) Acute Rehab OT Goals Patient Stated Goal: to get a shower seat OT Goal Formulation: With patient Time For Goal Achievement: 08/21/21 Potential to Achieve Goals: Good  OT Frequency: Min 2X/week    Co-evaluation              AM-PAC OT "6 Clicks" Daily Activity     Outcome Measure Help from another person eating meals?: None Help from another person taking care of personal grooming?: None Help from another person toileting, which includes using toliet, bedpan, or urinal?: A Little Help from another person bathing (including washing, rinsing, drying)?: A Little Help from another person to put on and taking off regular upper body clothing?: None Help from another person to put on and taking off regular lower body clothing?: A Little 6 Click Score: 21   End of Session Equipment Utilized  During Treatment: Rolling walker (2 wheels) Nurse Communication: Mobility status;Precautions  Activity Tolerance: Patient tolerated treatment well Patient left: in chair;with call bell/phone within reach;with chair alarm set  OT Visit Diagnosis: Unsteadiness on feet (R26.81)                Time: 1300-1330 OT Time Calculation (min): 30 min Charges:  OT General Charges $OT Visit: 1 Visit OT Evaluation $OT Eval Moderate Complexity: 1 Mod OT Treatments $Self Care/Home Management : 8-22 mins   Brynn, OTR/L  Acute Rehabilitation Services Office: [phone] .   Mateo Flow 08/07/2021, 2:28 PM

## 2021-08-07 NOTE — Progress Notes (Signed)
Inpatient Diabetes Program Recommendations  AACE/ADA: New Consensus Statement on Inpatient Glycemic Control (2015)  Target Ranges:  Prepandial:   less than 140 mg/dL      Peak postprandial:   less than 180 mg/dL (1-2 hours)      Critically ill patients:  140 - 180 mg/dL   Lab Results  Component Value Date   GLUCAP 516 (HH) 08/07/2021    Review of Glycemic Control  Latest Reference Range & Units 08/07/21 08:01  Glucose 70 - 99 mg/dL 371 (H)  (H): Data is abnormally high   Latest Reference Range & Units 08/07/21 11:57  Glucose-Capillary 70 - 99 mg/dL 696 (HH)  (HH): Data is critically high   Diabetes history: DM2 Outpatient Diabetes medications: Levemir 60 units BID, Humalog 5-30 units TID Current orders for Inpatient glycemic control: Levemir 60 units BID, Novolog 0-15 units TID and 0-5 units QHS,   Inpatient Diabetes Program Recommendations:    Received Decadron 4 mg yesterday afternoon.  Please consider:  Novolog 0-20 units Q4H   Will continue to follow while inpatient.  Thank you, Dulce Sellar, MSN, CDCES Diabetes Coordinator Inpatient Diabetes Program [phone] (team pager from 8a-5p)'

## 2021-08-07 NOTE — Hospital Course (Addendum)
Suzanne Patton is a 71 y.o. female with PMH HTN, HLD, and DMII who presented with abdominal wound.  There was concern for possible contribution from her insulin injections causing a wound that worsened. She was started on antibiotics on admission and general surgery was also consulted.  She underwent I&D of her abdominal wall abscess with placement of Penrose drain on 08/06/2021.  She will complete course of Augmentin at discharge as well and follow-up outpatient with general surgery for drain removal. She was able to ambulate well independently with mobility specialist prior to discharge along with also climbing stairs.  She was therefore discharged home in stable condition.

## 2021-08-07 NOTE — Progress Notes (Signed)
1 Day Post-Op   Subjective/Chief Complaint: Denies pain   Objective: Vital signs in last 24 hours: Temp:  [97.6 F (36.4 C)-100.9 F (38.3 C)] 97.8 F (36.6 C) (05/22 0310) Pulse Rate:  [59-76] 62 (05/22 0310) Resp:  [17-20] 20 (05/22 0310) BP: (124-147)/(58-85) 127/63 (05/22 0310) SpO2:  [85 %-95 %] 93 % (05/22 0310) Weight:  [120.7 kg-122.3 kg] 122.3 kg (05/22 0345)    Intake/Output from previous day: 05/21 0701 - 05/22 0700 In: 640.5 [I.V.:40.5; IV Piggyback:600] Out: 5 [Blood:5] Intake/Output this shift: No intake/output data recorded.  A&Ox3 Unlabored respirations Abd s/nt, packing removed and wounds clean, dry, with small radius of erythema but no residual fluctuance. Penrose in place  Lab Results:  Recent Labs    08/06/21 1407 08/06/21 2337  WBC 11.2* 12.9*  HGB 12.5 11.4*  HCT 38.2 36.1  PLT 218 218   BMET Recent Labs    08/06/21 1407 08/06/21 2337  NA 139 136  K 4.7 5.2*  CL 103 105  CO2 28 23  GLUCOSE 230* 177*  BUN 20 23  CREATININE 1.67* 1.66*  CALCIUM 9.3 8.9   PT/INR No results for input(s): LABPROT, INR in the last 72 hours. ABG No results for input(s): PHART, HCO3 in the last 72 hours.  Invalid input(s): PCO2, PO2  Studies/Results: No results found.  Anti-infectives: Anti-infectives (From admission, onward)    Start     Dose/Rate Route Frequency Ordered Stop   08/08/21 2000  vancomycin (VANCOREADY) IVPB 1750 mg/350 mL        1,750 mg 175 mL/hr over 120 Minutes Intravenous Every 48 hours 08/06/21 1741     08/06/21 2200  ceFEPIme (MAXIPIME) 2 g in sodium chloride 0.9 % 100 mL IVPB        2 g 200 mL/hr over 30 Minutes Intravenous Every 12 hours 08/06/21 1738     08/06/21 1737  vancomycin variable dose per unstable renal function (pharmacist dosing)  Status:  Discontinued         Does not apply See admin instructions 08/06/21 1738 08/06/21 1741   08/06/21 1630  ceFEPIme (MAXIPIME) 2 g in sodium chloride 0.9 % 100 mL IVPB  Status:   Discontinued        2 g 200 mL/hr over 30 Minutes Intravenous  Once 08/06/21 1616 08/06/21 1738   08/06/21 1500  piperacillin-tazobactam (ZOSYN) IVPB 3.375 g        3.375 g 100 mL/hr over 30 Minutes Intravenous  Once 08/06/21 1451 08/06/21 1625   08/06/21 1500  vancomycin (VANCOREADY) IVPB 2000 mg/400 mL        2,000 mg 200 mL/hr over 120 Minutes Intravenous  Once 08/06/21 1455         Assessment/Plan: s/p Procedure(s): IRRIGATION AND DEBRIDEMENT ABDOMINAL WALL  ABSCESS (N/A)  Change dry dressing to wound daily and PRN Penrose will be removed in office in 2-3 weeks. Would do a 5day course of abx at discharge  OK for discharge from surgery standpoint when cleared by primary team   LOS: 0 days    Suzanne Patton 08/07/2021

## 2021-08-07 NOTE — Evaluation (Signed)
Physical Therapy Evaluation  Patient Details Name: Suzanne Patton MRN: [id number] DOB: 08/25/1950 Today's Date: 08/07/2021  History of Present Illness  Pt is a 71 y/o female who presents s/p abdominal wound since Friday 5/19. She is now s/p I&D on 5/21 with general surgery. PMH significant for HTN, DM, CAD, PVD, CKD III, R rotator cuff repair.  Clinical Impression  Pt admitted with above diagnosis. Pt currently with functional limitations due to the deficits listed below (see PT Problem List). At the time of PT eval pt was able to perform transfers and ambulation with gross min guard assist and RW for support. Pt is likely near baseline of function at this time and anticipate she will progress well with post-op mobility. Recommend returning to outpatient PT when MD clears pt for exercise. Acutely, pt will benefit from skilled PT to increase their independence and safety with mobility to allow discharge to the venue listed below.          Recommendations for follow up therapy are one component of a multi-disciplinary discharge planning process, led by the attending physician.  Recommendations may be updated based on patient status, additional functional criteria and insurance authorization.  Follow Up Recommendations Outpatient PT (Resume when appropriate per post-op protocol.)    Assistance Recommended at Discharge Set up Supervision/Assistance  Patient can return home with the following  A little help with walking and/or transfers;Assistance with cooking/housework;Assist for transportation;Help with stairs or ramp for entrance    Equipment Recommendations None recommended by PT  Recommendations for Other Services       Functional Status Assessment Patient has had a recent decline in their functional status and demonstrates the ability to make significant improvements in function in a reasonable and predictable amount of time.     Precautions / Restrictions Precautions Precautions:  Fall Precaution Comments: abdominal incision Restrictions Weight Bearing Restrictions: No      Mobility  Bed Mobility               General bed mobility comments: Pt was received sitting up EOB.    Transfers Overall transfer level: Needs assistance Equipment used: Rolling walker (2 wheels) Transfers: Sit to/from Stand Sit to Stand: Min guard           General transfer comment: Increased time and VC's for hand placement on seated surface for safety.    Ambulation/Gait Ambulation/Gait assistance: Min guard Gait Distance (Feet): 200 Feet Assistive device: Rolling walker (2 wheels) Gait Pattern/deviations: Step-through pattern, Decreased stride length, Drifts right/left, Trunk flexed Gait velocity: Decreased Gait velocity interpretation: <1.31 ft/sec, indicative of household ambulator   General Gait Details: Slow and guarded with poor posture, looking at the floor. VC's for improved posture, closer walker proximity, and forward gaze. No assist required however hands on guarding provided throughout gait training for safety.  Stairs            Wheelchair Mobility    Modified Rankin (Stroke Patients Only)       Balance Overall balance assessment: Needs assistance Sitting-balance support: Feet supported, No upper extremity supported Sitting balance-Leahy Scale: Fair     Standing balance support: Bilateral upper extremity supported Standing balance-Leahy Scale: Poor Standing balance comment: Reliant on RW for support                             Pertinent Vitals/Pain Pain Assessment Pain Assessment: No/denies pain    Home Living Family/patient expects to be discharged to::  Private residence Living Arrangements: Alone Available Help at Discharge: Family;Available PRN/intermittently Type of Home: Apartment Home Access: Stairs to enter Entrance Stairs-Rails: Right;Left;Can reach both Entrance Stairs-Number of Steps: 7   Home Layout: One  level Home Equipment: Conservation officer, nature (2 wheels);Cane - single point Additional Comments: reports using soap dish as a gra bar. Pt drives and does all appointments. pt drives to pick up 53 yo grandson. Daughter and pt share a care. Daughter going to school and Melburn Popper drives with car at times.    Prior Function Prior Level of Function : Independent/Modified Independent             Mobility Comments: frequent falls/slips/trips. Drives, takes her grandson to work and school (he is 26). Does not use an AD despite frequent falls. Pt currently in outpatient PT for balance training and strengthening. ADLs Comments: Pt reports independence with ADL's     Hand Dominance   Dominant Hand: Right    Extremity/Trunk Assessment   Upper Extremity Assessment Upper Extremity Assessment: Overall WFL for tasks assessed    Lower Extremity Assessment Lower Extremity Assessment: Defer to PT evaluation    Cervical / Trunk Assessment Cervical / Trunk Assessment: Normal  Communication   Communication: No difficulties  Cognition Arousal/Alertness: Awake/alert Behavior During Therapy: Restless, Impulsive Overall Cognitive Status: Impaired/Different from baseline Area of Impairment: Safety/judgement                         Safety/Judgement: Decreased awareness of safety, Decreased awareness of deficits     General Comments: Pt insisting she has not "fallen" however endorses frequent "trips and slips" in which she ends up on the floor afterwards. Pt does not see these instances as "falls".        General Comments      Exercises     Assessment/Plan    PT Assessment Patient needs continued PT services  PT Problem List Decreased strength;Decreased activity tolerance;Decreased balance;Decreased mobility;Decreased knowledge of use of DME;Decreased safety awareness;Decreased knowledge of precautions;Pain       PT Treatment Interventions DME instruction;Gait training;Stair  training;Functional mobility training;Therapeutic activities;Therapeutic exercise;Neuromuscular re-education;Patient/family education    PT Goals (Current goals can be found in the Care Plan section)  Acute Rehab PT Goals Patient Stated Goal: Return home and get back to outpatient PT PT Goal Formulation: With patient Time For Goal Achievement: 08/14/21 Potential to Achieve Goals: Good    Frequency Min 3X/week     Co-evaluation               AM-PAC PT "6 Clicks" Mobility  Outcome Measure Help needed turning from your back to your side while in a flat bed without using bedrails?: A Little Help needed moving from lying on your back to sitting on the side of a flat bed without using bedrails?: A Little Help needed moving to and from a bed to a chair (including a wheelchair)?: A Little Help needed standing up from a chair using your arms (e.g., wheelchair or bedside chair)?: A Little Help needed to walk in hospital room?: A Little Help needed climbing 3-5 steps with a railing? : A Little 6 Click Score: 18    End of Session Equipment Utilized During Treatment: Gait belt Activity Tolerance: Patient tolerated treatment well Patient left: in chair;with call bell/phone within reach;with chair alarm set Nurse Communication: Mobility status PT Visit Diagnosis: Unsteadiness on feet (R26.81);Difficulty in walking, not elsewhere classified (R26.2)    Time: [id number] PT Time Calculation (  min) (ACUTE ONLY): 31 min   Charges:   PT Evaluation $PT Eval Moderate Complexity: 1 Mod PT Treatments $Gait Training: 8-22 mins        Rolinda Roan, PT, DPT Acute Rehabilitation Services Secure Chat Preferred Office: [phone]   Thelma Comp 08/07/2021, 3:10 PM

## 2021-08-07 NOTE — Progress Notes (Signed)
PROGRESS NOTE    Suzanne Patton  [id number] DOB: 05-15-1950 DOA: 08/06/2021 PCP: [name], DO    Brief Narrative:  Suzanne Patton is a 71 y.o. female with medical history significant of HTN, HLD, and DM presenting with an abdominal wound. She reports that she rotates her insulin injections.  Friday, she noticed an abdominal wall wound.  During the weekend, it has gotten more red and swollen and tender.  No clear fever but she has had chills and sweats.   S/P OR on 5/21 with GS    Assessment and Plan: Abdominal wall cellulitis with abscess -Patient presenting with apparent abdominal wall asbcess as well as with streaking erythema across the abdominal wall - Vanc and Cefepime per the cellulitis order set, -s/p OR I&D with Dr. Dema Severin -cultures pending   DM -Continue Levemir -SSI   Stage 3b CKD -Uncertain baseline but she is at stage 3b now so would assume she is close to baseline -Hold hold Cozaar for now   HTN -Continue diltiazem, labetalol -Hold Cozaar for now   HLD -Continue Crestor, Zetia -Hold fish oil due to limited inpatient utility   CAD/PVD -s/p carotid stent -Continue ASA   Mood d/o -Continue Celexa, Klonopin, lamotrigine -Appears quite anxious (hypomanic?) now but this may be situational   OSA -Continue CPAP   Hyperkalemia -unclear -recheck in AM  Morbid obesity Estimated body mass index is 41 kg/m as calculated from the following:   Height as of this encounter: 5\' 8"  (1.727 m).   Weight as of this encounter: 122.3 kg.    Patient is jehovah's witness   DVT prophylaxis: enoxaparin (LOVENOX) injection 40 mg Start: 08/06/21 2200    Code Status: Full Code Family Communication:   Disposition Plan:  Level of care: Telemetry Medical Status is: Observation The patient will require care spanning > 2 midnights and should be moved to inpatient because: needs IV abx, improvement in labs-- home in AM?    Consultants:  GS   Subjective: No current  complaints Former nurse  Objective: Vitals:   08/07/21 0000 08/07/21 0310 08/07/21 0345 08/07/21 0855  BP:  127/63  129/61  Pulse: (!) 59 62  63  Resp: 20 20  17   Temp: 97.6 F (36.4 C) 97.8 F (36.6 C)  (!) 97.5 F (36.4 C)  TempSrc: Oral Oral  Oral  SpO2: (!) 85% 93%  94%  Weight:   122.3 kg   Height:        Intake/Output Summary (Last 24 hours) at 08/07/2021 0900 Last data filed at 08/07/2021 0305 Gross per 24 hour  Intake 640.51 ml  Output 5 ml  Net 635.51 ml   Filed Weights   08/06/21 1703 08/07/21 0345  Weight: 120.7 kg 122.3 kg    Examination:   General: Appearance:    Severely obese female in no acute distress   Surgical wound covered  Lungs:     Clear to auscultation bilaterally, respirations unlabored  Heart:    Normal heart rate. Normal rhythm. No murmurs, rubs, or gallops.    MS:   All extremities are intact.    Neurologic:   Awake, alert, oriented x 3. No apparent focal neurological           defect.        Data Reviewed: I have personally reviewed following labs and imaging studies  CBC: Recent Labs  Lab 08/06/21 1407 08/06/21 2337 08/07/21 0801  WBC 11.2* 12.9* 11.1*  HGB 12.5 11.4* 10.7*  HCT 38.2  36.1 33.3*  MCV 88.8 89.6 89.8  PLT 218 218 [id number]   Basic Metabolic Panel: Recent Labs  Lab 08/06/21 1407 08/06/21 2337 08/07/21 0801  NA 139 136 137  K 4.7 5.2* 5.3*  CL 103 105 104  CO2 28 23 24   GLUCOSE 230* 177* 400*  BUN 20 23 28*  CREATININE 1.67* 1.66* 1.68*  CALCIUM 9.3 8.9 8.3*   GFR: Estimated Creatinine Clearance: 42.3 mL/min (A) (by C-G formula based on SCr of 1.68 mg/dL (H)). Liver Function Tests: Recent Labs  Lab 08/06/21 1407  AST 18  ALT 14  ALKPHOS 84  BILITOT 1.0  PROT 7.1  ALBUMIN 3.8   Recent Labs  Lab 08/06/21 1407  LIPASE 26   No results for input(s): AMMONIA in the last 168 hours. Coagulation Profile: No results for input(s): INR, PROTIME in the last 168 hours. Cardiac Enzymes: No results for  input(s): CKTOTAL, CKMB, CKMBINDEX, TROPONINI in the last 168 hours. BNP (last 3 results) No results for input(s): PROBNP in the last 8760 hours. HbA1C: No results for input(s): HGBA1C in the last 72 hours. CBG: Recent Labs  Lab 08/06/21 1637 08/06/21 1817  GLUCAP 110* 85   Lipid Profile: No results for input(s): CHOL, HDL, LDLCALC, TRIG, CHOLHDL, LDLDIRECT in the last 72 hours. Thyroid Function Tests: No results for input(s): TSH, T4TOTAL, FREET4, T3FREE, THYROIDAB in the last 72 hours. Anemia Panel: No results for input(s): VITAMINB12, FOLATE, FERRITIN, TIBC, IRON, RETICCTPCT in the last 72 hours. Sepsis Labs: Recent Labs  Lab 08/06/21 1407 08/06/21 2138 08/06/21 2337  LATICACIDVEN 2.7* 0.7 0.7    Recent Results (from the past 240 hour(s))  Culture, blood (single)     Status: None (Preliminary result)   Collection Time: 08/06/21  2:53 PM   Specimen: BLOOD  Result Value Ref Range Status   Specimen Description BLOOD LEFT ANTECUBITAL  Final   Special Requests   Final    BOTTLES DRAWN AEROBIC AND ANAEROBIC Blood Culture adequate volume   Culture   Final    NO GROWTH < 24 HOURS Performed at [facility], [address] [place], [place] [address]    Report Status PENDING  Incomplete  Aerobic/Anaerobic Culture w Gram Stain (surgical/deep wound)     Status: None (Preliminary result)   Collection Time: 08/06/21  6:17 PM   Specimen: Abscess  Result Value Ref Range Status   Specimen Description ABSCESS  Final   Special Requests NONE  Final   Gram Stain   Final    NO SQUAMOUS EPITHELIAL CELLS SEEN FEW WBC SEEN MODERATE GRAM NEGATIVE RODS ABUNDANT GRAM POSITIVE COCCI    Culture   Final    NO GROWTH < 24 HOURS Performed at [facility], [place] [address], [place], [place] [address]    Report Status PENDING  Incomplete         Radiology Studies: No results found.      Scheduled Meds:  aspirin EC  81 mg Oral QHS   citalopram  20 mg Oral q morning    clonazePAM  0.75 mg Oral QHS   diltiazem  120 mg Oral q morning   docusate sodium  100 mg Oral BID   enoxaparin (LOVENOX) injection  40 mg Subcutaneous Q24H   ezetimibe  10 mg Oral q morning   insulin aspart  0-15 Units Subcutaneous TID WC   insulin aspart  0-5 Units Subcutaneous QHS   insulin detemir  60 Units Subcutaneous BID   labetalol  300 mg Oral BID   lamoTRIgine  200 mg Oral BID   loratadine  10 mg Oral QHS   pantoprazole  40 mg Oral Daily   rosuvastatin  20 mg Oral q morning   sodium chloride flush  3 mL Intravenous Q12H   Continuous Infusions:  ceFEPime (MAXIPIME) IV 2 g (08/06/21 2358)   lactated ringers     lactated ringers 75 mL/hr at 08/06/21 2017   [START ON 08/08/2021] vancomycin     vancomycin       LOS: 0 days    Time spent: 35 minutes spent on chart review, discussion with nursing staff, consultants, updating family and interview/physical exam; more than 50% of that time was spent in counseling and/or coordination of care.    Geradine Girt, DO [facility] Available via Epic secure chat 7am-7pm After these hours, please refer to coverage provider listed on [email or web address] 08/07/2021, 9:00 AM

## 2021-08-08 DIAGNOSIS — I10 Essential (primary) hypertension: Secondary | ICD-10-CM

## 2021-08-08 DIAGNOSIS — L02211 Cutaneous abscess of abdominal wall: Secondary | ICD-10-CM | POA: Diagnosis not present

## 2021-08-08 LAB — CBC
HCT: 31.8 % — ABNORMAL LOW (ref 36.0–46.0)
Hemoglobin: 10.4 g/dL — ABNORMAL LOW (ref 12.0–15.0)
MCH: 29 pg (ref 26.0–34.0)
MCHC: 32.7 g/dL (ref 30.0–36.0)
MCV: 88.6 fL (ref 80.0–100.0)
Platelets: 241 10*3/uL (ref 150–400)
RBC: 3.59 MIL/uL — ABNORMAL LOW (ref 3.87–5.11)
RDW: 13.2 % (ref 11.5–15.5)
WBC: 10.3 10*3/uL (ref 4.0–10.5)
nRBC: 0 % (ref 0.0–0.2)

## 2021-08-08 LAB — BASIC METABOLIC PANEL
Anion gap: 7 (ref 5–15)
BUN: 40 mg/dL — ABNORMAL HIGH (ref 8–23)
CO2: 25 mmol/L (ref 22–32)
Calcium: 8.4 mg/dL — ABNORMAL LOW (ref 8.9–10.3)
Chloride: 102 mmol/L (ref 98–111)
Creatinine, Ser: 1.91 mg/dL — ABNORMAL HIGH (ref 0.44–1.00)
GFR, Estimated: 28 mL/min — ABNORMAL LOW (ref >60)
Glucose, Bld: 290 mg/dL — ABNORMAL HIGH (ref 70–99)
Potassium: 4.5 mmol/L (ref 3.5–5.1)
Sodium: 134 mmol/L — ABNORMAL LOW (ref 135–145)

## 2021-08-08 LAB — GLUCOSE, CAPILLARY
Glucose-Capillary: 248 mg/dL — ABNORMAL HIGH (ref 70–99)
Glucose-Capillary: 150 mg/dL — ABNORMAL HIGH (ref 70–99)
Glucose-Capillary: 180 mg/dL — ABNORMAL HIGH (ref 70–99)
Glucose-Capillary: 124 mg/dL — ABNORMAL HIGH (ref 70–99)

## 2021-08-08 MED ORDER — POLYVINYL ALCOHOL 1.4 % OP SOLN
1.0000 [drp] | OPHTHALMIC | Status: DC | PRN
  Administered 2021-08-08: 1 [drp] via OPHTHALMIC
  Filled 2021-08-08: qty 15

## 2021-08-08 MED ORDER — AMOXICILLIN-POT CLAVULANATE 875-125 MG PO TABS
1.0000 | ORAL_TABLET | Freq: Two times a day (BID) | ORAL | Status: DC
  Administered 2021-08-08 – 2021-08-09 (×2): 1 via ORAL
  Filled 2021-08-08 (×2): qty 1

## 2021-08-08 NOTE — Progress Notes (Signed)
PROGRESS NOTE    Suzanne Patton  [id number] DOB: 10-08-1950 DOA: 08/06/2021 PCP: [name], DO    Brief Narrative:  Suzanne Patton is a 71 y.o. female with medical history significant of HTN, HLD, and DM presenting with an abdominal wound. She reports that she rotates her insulin injections.  Friday, she noticed an abdominal wall wound.  During the weekend, it has gotten more red and swollen and tender.  No clear fever but she has had chills and sweats.   S/P OR on 5/21 with GS    Assessment and Plan: Abdominal wall cellulitis with abscess -Patient presenting with apparent abdominal wall asbcess as well as with streaking erythema across the abdominal wall - Vanc and Cefepime per the cellulitis order set-- narrow to augmentin -s/p OR I&D with Dr. Dema Severin -cultures pending   DM -Continue Levemir -SSI- added meal coverage   Stage 3b CKD -Uncertain baseline but she is at stage 3b now so would assume she is close to baseline -Hold hold Cozaar for now   HTN -Continue diltiazem, labetalol- holding parameters -Hold Cozaar for now   HLD -Continue Crestor, Zetia -Hold fish oil due to limited inpatient utility   CAD/PVD -s/p carotid stent -Continue ASA   Mood d/o -Continue Celexa, Klonopin, lamotrigine -Appears quite anxious (hypomanic?) now but this may be situational   OSA -Continue CPAP   Hyperkalemia -resolved  Morbid obesity Estimated body mass index is 41 kg/m as calculated from the following:   Height as of this encounter: 5\' 8"  (1.727 m).   Weight as of this encounter: 122.3 kg.    Patient is Sales promotion account executive witness   DVT prophylaxis:     Code Status: Full Code   Disposition Plan:  Level of care: Med-Surg Home in AM?  Consultants:  GS   Subjective: Is sore but overall feeling better Walking to bathroom  Objective: Vitals:   08/07/21 2001 08/07/21 2356 08/08/21 0409 08/08/21 0801  BP: (!) 158/86  139/70 (!) 130/56  Pulse: 70 78 (!) 59   Resp: 17 16 18  18   Temp: 97.7 F (36.5 C)  97.9 F (36.6 C) (!) 97.5 F (36.4 C)  TempSrc: Oral  Oral Oral  SpO2: 96% 98% 97% 94%  Weight:      Height:        Intake/Output Summary (Last 24 hours) at 08/08/2021 1448 Last data filed at 08/07/2021 1640 Gross per 24 hour  Intake 240 ml  Output --  Net 240 ml   Filed Weights   08/06/21 1703 08/07/21 0345  Weight: 120.7 kg 122.3 kg    Examination:    General: Appearance:    Severely obese female in no acute distress     Lungs:      respirations unlabored  Heart:    Bradycardic.   MS:   All extremities are intact.   Neurologic:   Awake, alert       Data Reviewed: I have personally reviewed following labs and imaging studies  CBC: Recent Labs  Lab 08/06/21 1407 08/06/21 2337 08/07/21 0801 08/08/21 0329  WBC 11.2* 12.9* 11.1* 10.3  HGB 12.5 11.4* 10.7* 10.4*  HCT 38.2 36.1 33.3* 31.8*  MCV 88.8 89.6 89.8 88.6  PLT 218 218 200 [id number]   Basic Metabolic Panel: Recent Labs  Lab 08/06/21 1407 08/06/21 2337 08/07/21 0801 08/08/21 0329  NA 139 136 137 134*  K 4.7 5.2* 5.3* 4.5  CL 103 105 104 102  CO2 28 23 24 25   GLUCOSE 230*  177* 400* 290*  BUN 20 23 28* 40*  CREATININE 1.67* 1.66* 1.68* 1.91*  CALCIUM 9.3 8.9 8.3* 8.4*   GFR: Estimated Creatinine Clearance: 37.2 mL/min (A) (by C-G formula based on SCr of 1.91 mg/dL (H)). Liver Function Tests: Recent Labs  Lab 08/06/21 1407  AST 18  ALT 14  ALKPHOS 84  BILITOT 1.0  PROT 7.1  ALBUMIN 3.8   Recent Labs  Lab 08/06/21 1407  LIPASE 26   No results for input(s): AMMONIA in the last 168 hours. Coagulation Profile: No results for input(s): INR, PROTIME in the last 168 hours. Cardiac Enzymes: No results for input(s): CKTOTAL, CKMB, CKMBINDEX, TROPONINI in the last 168 hours. BNP (last 3 results) No results for input(s): PROBNP in the last 8760 hours. HbA1C: No results for input(s): HGBA1C in the last 72 hours. CBG: Recent Labs  Lab 08/07/21 1337 08/07/21 1642  08/07/21 2002 08/08/21 0756 08/08/21 1157  GLUCAP 459* 402* 302* 248* 150*   Lipid Profile: No results for input(s): CHOL, HDL, LDLCALC, TRIG, CHOLHDL, LDLDIRECT in the last 72 hours. Thyroid Function Tests: No results for input(s): TSH, T4TOTAL, FREET4, T3FREE, THYROIDAB in the last 72 hours. Anemia Panel: No results for input(s): VITAMINB12, FOLATE, FERRITIN, TIBC, IRON, RETICCTPCT in the last 72 hours. Sepsis Labs: Recent Labs  Lab 08/06/21 1407 08/06/21 2138 08/06/21 2337  LATICACIDVEN 2.7* 0.7 0.7    Recent Results (from the past 240 hour(s))  Culture, blood (single)     Status: None (Preliminary result)   Collection Time: 08/06/21  2:53 PM   Specimen: BLOOD  Result Value Ref Range Status   Specimen Description BLOOD LEFT ANTECUBITAL  Final   Special Requests   Final    BOTTLES DRAWN AEROBIC AND ANAEROBIC Blood Culture adequate volume   Culture   Final    NO GROWTH 2 DAYS Performed at [facility], [address] [place], [place] [address]    Report Status PENDING  Incomplete  Aerobic/Anaerobic Culture w Gram Stain (surgical/deep wound)     Status: None (Preliminary result)   Collection Time: 08/06/21  6:17 PM   Specimen: Abscess  Result Value Ref Range Status   Specimen Description ABSCESS  Final   Special Requests NONE  Final   Gram Stain   Final    NO SQUAMOUS EPITHELIAL CELLS SEEN FEW WBC SEEN MODERATE GRAM NEGATIVE RODS ABUNDANT GRAM POSITIVE COCCI    Culture   Final    ABUNDANT STREPTOCOCCUS INTERMEDIUS SUSCEPTIBILITIES TO FOLLOW Performed at [facility], [place] [address] [place], [place] [address]    Report Status PENDING  Incomplete         Radiology Studies: No results found.      Scheduled Meds:  amoxicillin-clavulanate  1 tablet Oral Q12H   aspirin EC  81 mg Oral QHS   citalopram  20 mg Oral q morning   clonazePAM  0.75 mg Oral QHS   diltiazem  120 mg Oral q morning   docusate sodium  100 mg Oral BID   enoxaparin  (LOVENOX) injection  60 mg Subcutaneous Q24H   ezetimibe  10 mg Oral q morning   insulin aspart  0-15 Units Subcutaneous TID WC   insulin aspart  0-5 Units Subcutaneous QHS   insulin aspart  10 Units Subcutaneous TID WC   insulin detemir  60 Units Subcutaneous BID   labetalol  300 mg Oral BID   lamoTRIgine  200 mg Oral BID   loratadine  10 mg  Oral QHS   pantoprazole  40 mg Oral Daily   rosuvastatin  20 mg Oral q morning   sodium chloride flush  3 mL Intravenous Q12H   Continuous Infusions:     LOS: 1 day    Time spent: 35 minutes spent on chart review, discussion with nursing staff, consultants, updating family and interview/physical exam; more than 50% of that time was spent in counseling and/or coordination of care.    Geradine Girt, DO [facility] Available via Epic secure chat 7am-7pm After these hours, please refer to coverage provider listed on [email or web address] 08/08/2021, 2:48 PM

## 2021-08-08 NOTE — Progress Notes (Signed)
Mobility Specialist Progress Note:   08/08/21 1200  Mobility  Activity Ambulated with assistance in hallway  Level of Assistance Minimal assist, patient does 75% or more  Assistive Device Front wheel walker  Distance Ambulated (ft) 230 ft  Activity Response Tolerated well  $Mobility charge 1 Mobility   Pt eager for mobility session. Required up to minA to steady during ambulation. Pt with L hip pain from arthritis. Otherwise asx, pt left sitting EOB.   Addison Lank Acute Rehab Secure Chat or Office Phone: [phone]

## 2021-08-08 NOTE — Progress Notes (Signed)
2 Days Post-Op   Subjective/Chief Complaint: Reports upper abd pain she thinks from moving around. Some drainage from wound yesterday.    Objective: Vital signs in last 24 hours: Temp:  [97.5 F (36.4 C)-98.2 F (36.8 C)] 97.9 F (36.6 C) (05/23 0409) Pulse Rate:  [59-78] 59 (05/23 0409) Resp:  [16-19] 18 (05/23 0409) BP: (109-158)/(44-86) 139/70 (05/23 0409) SpO2:  [92 %-98 %] 97 % (05/23 0409)    Intake/Output from previous day: 05/22 0701 - 05/23 0700 In: 480 [P.O.:480] Out: -  Intake/Output this shift: No intake/output data recorded.  A&Ox3 Unlabored respirations Abd s/nt, wounds clean, dry, with small radius of erythema which is decreased compared to yesterday. Penrose in place  Lab Results:  Recent Labs    08/07/21 0801 08/08/21 0329  WBC 11.1* 10.3  HGB 10.7* 10.4*  HCT 33.3* 31.8*  PLT 200 241    BMET Recent Labs    08/07/21 0801 08/08/21 0329  NA 137 134*  K 5.3* 4.5  CL 104 102  CO2 24 25  GLUCOSE 400* 290*  BUN 28* 40*  CREATININE 1.68* 1.91*  CALCIUM 8.3* 8.4*    PT/INR No results for input(s): LABPROT, INR in the last 72 hours. ABG No results for input(s): PHART, HCO3 in the last 72 hours.  Invalid input(s): PCO2, PO2  Studies/Results: No results found.  Anti-infectives: Anti-infectives (From admission, onward)    Start     Dose/Rate Route Frequency Ordered Stop   08/08/21 2000  vancomycin (VANCOREADY) IVPB 1750 mg/350 mL        1,750 mg 175 mL/hr over 120 Minutes Intravenous Every 48 hours 08/06/21 1741     08/06/21 2200  ceFEPIme (MAXIPIME) 2 g in sodium chloride 0.9 % 100 mL IVPB        2 g 200 mL/hr over 30 Minutes Intravenous Every 12 hours 08/06/21 1738     08/06/21 1737  vancomycin variable dose per unstable renal function (pharmacist dosing)  Status:  Discontinued         Does not apply See admin instructions 08/06/21 1738 08/06/21 1741   08/06/21 1630  ceFEPIme (MAXIPIME) 2 g in sodium chloride 0.9 % 100 mL IVPB   Status:  Discontinued        2 g 200 mL/hr over 30 Minutes Intravenous  Once 08/06/21 1616 08/06/21 1738   08/06/21 1500  piperacillin-tazobactam (ZOSYN) IVPB 3.375 g        3.375 g 100 mL/hr over 30 Minutes Intravenous  Once 08/06/21 1451 08/06/21 1625   08/06/21 1500  vancomycin (VANCOREADY) IVPB 2000 mg/400 mL  Status:  Discontinued        2,000 mg 200 mL/hr over 120 Minutes Intravenous  Once 08/06/21 1455 08/07/21 1021       Assessment/Plan: s/p Procedure(s): IRRIGATION AND DEBRIDEMENT ABDOMINAL WALL  ABSCESS (N/A)  Change dry dressing to wound daily and PRN Penrose will be removed in office in 2-3 weeks. Would do a 5day course of abx at discharge  OK for discharge from surgery standpoint when cleared by primary team. Will sign off; please call if we can be of help.    LOS: 1 day    Berna Bue 08/08/2021

## 2021-08-08 NOTE — Progress Notes (Signed)
Pt would like to wear [place] set at 2.5 L to bed instead of CPAP tonight due to CPAP mask drying her eyes. Pt states she wore it last night and it was more comfortable. RT set up [place] to 2.5L and placed within pt reach. RT will cont to monitor as needed. RN aware.

## 2021-08-09 DIAGNOSIS — L02211 Cutaneous abscess of abdominal wall: Secondary | ICD-10-CM | POA: Diagnosis not present

## 2021-08-09 LAB — CBC
HCT: 31.7 % — ABNORMAL LOW (ref 36.0–46.0)
Hemoglobin: 10.3 g/dL — ABNORMAL LOW (ref 12.0–15.0)
MCH: 28.9 pg (ref 26.0–34.0)
MCHC: 32.5 g/dL (ref 30.0–36.0)
MCV: 88.8 fL (ref 80.0–100.0)
Platelets: 231 10*3/uL (ref 150–400)
RBC: 3.57 MIL/uL — ABNORMAL LOW (ref 3.87–5.11)
RDW: 13.2 % (ref 11.5–15.5)
WBC: 6.8 10*3/uL (ref 4.0–10.5)
nRBC: 0 % (ref 0.0–0.2)

## 2021-08-09 LAB — GLUCOSE, CAPILLARY
Glucose-Capillary: 76 mg/dL (ref 70–99)
Glucose-Capillary: 88 mg/dL (ref 70–99)

## 2021-08-09 LAB — BASIC METABOLIC PANEL
Anion gap: 7 (ref 5–15)
BUN: 35 mg/dL — ABNORMAL HIGH (ref 8–23)
CO2: 26 mmol/L (ref 22–32)
Calcium: 8.3 mg/dL — ABNORMAL LOW (ref 8.9–10.3)
Chloride: 108 mmol/L (ref 98–111)
Creatinine, Ser: 1.83 mg/dL — ABNORMAL HIGH (ref 0.44–1.00)
GFR, Estimated: 29 mL/min — ABNORMAL LOW (ref >60)
Glucose, Bld: 94 mg/dL (ref 70–99)
Potassium: 3.9 mmol/L (ref 3.5–5.1)
Sodium: 141 mmol/L (ref 135–145)

## 2021-08-09 MED ORDER — AMOXICILLIN-POT CLAVULANATE 875-125 MG PO TABS: 1.0000 | ORAL_TABLET | Freq: Two times a day (BID) | ORAL | 0 refills | Status: AC

## 2021-08-09 NOTE — Progress Notes (Signed)
Mobility Specialist Progress Note:   08/09/21 1525  Mobility  Activity Ambulated independently in hallway  Level of Assistance Independent  Assistive Device None  Activity Response Tolerated well  $Mobility charge 1 Mobility   Pt ready for d/c, daughter wanting to see patient ambulate + stairs before. Pt able to ambulate independently in hallway, and navigate stairs with railing on R. Tolerated well. Left with nursing staff to d/c.  Addison Lank Acute Rehab Secure Chat or Office Phone: [phone]

## 2021-08-09 NOTE — Discharge Instructions (Signed)
Change dry dressing to wound daily and as needed. Penrose will be removed in office in 2-3 weeks

## 2021-08-09 NOTE — Plan of Care (Signed)
  Problem: Clinical Measurements: Goal: Ability to avoid or minimize complications of infection will improve Outcome: Adequate for Discharge   Problem: Skin Integrity: Goal: Skin integrity will improve Outcome: Adequate for Discharge   Problem: Acute Rehab OT Goals (only OT should resolve) Goal: Pt. Will Perform Grooming Outcome: Adequate for Discharge Goal: Pt. Will Perform Tub/Shower Transfer Outcome: Adequate for Discharge Goal: OT Additional ADL Goal #1 Outcome: Adequate for Discharge   Problem: Acute Rehab PT Goals(only PT should resolve) Goal: Pt Will Go Supine/Side To Sit Outcome: Adequate for Discharge Goal: Patient Will Transfer Sit To/From Stand Outcome: Adequate for Discharge Goal: Pt Will Ambulate Outcome: Adequate for Discharge Goal: Pt Will Go Up/Down Stairs Outcome: Adequate for Discharge

## 2021-08-09 NOTE — Progress Notes (Signed)
Pt discharged home with daughter in stable condition 

## 2021-08-09 NOTE — Care Management Important Message (Signed)
Important Message  Patient Details  Name: Suzanne Patton MRN: [id number] Date of Birth: 1951/02/28   Medicare Important Message Given:  Yes     Orbie Pyo 08/09/2021, 2:29 PM

## 2021-08-09 NOTE — Progress Notes (Signed)
Mobility Specialist Progress Note:   08/09/21 1100  Mobility  Activity  (bed level exercises)  Range of Motion/Exercises Active;All extremities  Level of Assistance Independent  Activity Response Tolerated well  $Mobility charge 1 Mobility   Pt agreeable to mobility session, however declining all OOB mobility d/t lethargy from meds. Agreed to bed level exercises. Pt tolerated well. Left in bed with all needs met, promptly fell asleep.   Nelta Numbers Acute Rehab Secure Chat or Office Phone: [phone]

## 2021-08-09 NOTE — Discharge Summary (Signed)
Physician Discharge Summary   Suzanne Patton [id number] DOB: 26-Nov-1950 DOA: 08/06/2021  PCP: [name], DO  Admit date: 08/06/2021 Discharge date: 08/09/2021   Admitted From: Home Disposition:  Home Discharging physician: Dwyane Dee, MD  Recommendations for Outpatient Follow-up:  Follow up with general surgery  Home Health:  Equipment/Devices:   Discharge Condition: stable CODE STATUS: Full Diet recommendation:  Diet Orders (From admission, onward)     Start     Ordered   08/09/21 0000  Diet - low sodium heart healthy        08/09/21 1227   08/09/21 0000  Diet Carb Modified        08/09/21 1227   08/06/21 2045  Diet heart healthy/carb modified Room service appropriate? Yes; Fluid consistency: Thin  Diet effective now       Question Answer Comment  Diet-HS Snack? Nothing   Room service appropriate? Yes   Fluid consistency: Thin      08/06/21 2044            Hospital Course: Suzanne Patton is a 71 y.o. female with PMH HTN, HLD, and DMII who presented with abdominal wound.  There was concern for possible contribution from her insulin injections causing a wound that worsened. She was started on antibiotics on admission and general surgery was also consulted.  She underwent I&D of her abdominal wall abscess with placement of Penrose drain on 08/06/2021.  She will complete course of Augmentin at discharge as well and follow-up outpatient with general surgery for drain removal. She was able to ambulate well independently with mobility specialist prior to discharge along with also climbing stairs.  She was therefore discharged home in stable condition.   The patient's chronic medical conditions were treated accordingly per the patient's home medication regimen except as noted.  On day of discharge, patient was felt deemed stable for discharge. Patient/family member advised to call PCP or come back to ER if needed.   Principal Diagnosis: Abdominal wall abscess  Discharge  Diagnoses: Principal Problem:   Abdominal wall abscess Active Problems:   Mixed hyperlipidemia   Essential hypertension   OSA (obstructive sleep apnea)   Diabetes mellitus without complication (HCC)   Stage 3b chronic kidney disease (CKD) (HCC)   Mood disorder (HCC)   Discharge Instructions     Diet - low sodium heart healthy   Complete by: As directed    Diet Carb Modified   Complete by: As directed    Discharge wound care:   Complete by: As directed    Change dry dressing to wound daily and as needed for saturation   Increase activity slowly   Complete by: As directed       Allergies as of 08/09/2021       Reactions   Dulaglutide Shortness Of Breath   Ozempic (0.25 Or 0.5 Mg-dose) [semaglutide(0.25 Or 0.5mg -dos)] Nausea And Vomiting   Meclizine Other (See Comments)   Psychotic episode   Prednisone Other (See Comments)   hyperglycemia   Quinolones Other (See Comments)   severe muscle aches        Medication List     STOP taking these medications    HYDROcodone-acetaminophen 7.5-325 MG tablet Commonly known as: NORCO       TAKE these medications    albuterol 108 (90 Base) MCG/ACT inhaler Commonly known as: VENTOLIN HFA 1-2 puffs 4 (four) times daily as needed for wheezing or shortness of breath.   amoxicillin-clavulanate 875-125 MG tablet Commonly known as: AUGMENTIN Take  1 tablet by mouth every 12 (twelve) hours for 5 days.   aspirin EC 81 MG tablet Take 81 mg by mouth at bedtime.   CALCIUM CITRATE PO Take 2 tablets by mouth at bedtime.   citalopram 20 MG tablet Commonly known as: CELEXA Take 20 mg by mouth every morning.   clonazePAM 0.5 MG tablet Commonly known as: KLONOPIN Take 0.75 mg by mouth at bedtime.   diclofenac Sodium 1 % Gel Commonly known as: VOLTAREN Apply 1 application. topically at bedtime as needed (pain).   diltiazem 120 MG 24 hr capsule Commonly known as: CARDIZEM CD TAKE 1 CAPSULE BY MOUTH EVERY DAY What changed:   how much to take how to take this when to take this   ezetimibe 10 MG tablet Commonly known as: ZETIA Take 10 mg by mouth every morning.   FISH OIL OMEGA-3 PO Take 2 capsules by mouth at bedtime.   fluticasone 50 MCG/ACT nasal spray Commonly known as: FLONASE 1 spray daily as needed for allergies or rhinitis.   ibuprofen 200 MG tablet Commonly known as: ADVIL Take 800 mg by mouth daily as needed (pain).   insulin detemir 100 UNIT/ML injection Commonly known as: LEVEMIR Inject 60 Units into the skin 2 (two) times daily.   insulin lispro 100 UNIT/ML injection Commonly known as: HUMALOG 5-30 Units See admin instructions. Inject 5-30 units subcutaneously up to 5 times daily - sliding scale is based on CBG   labetalol 300 MG tablet Commonly known as: NORMODYNE Take 300 mg by mouth in the morning and at bedtime.   lamoTRIgine 200 MG tablet Commonly known as: LAMICTAL Take 200 mg by mouth 2 (two) times daily.   levocetirizine 5 MG tablet Commonly known as: XYZAL Take 5 mg by mouth at bedtime.   losartan 25 MG tablet Commonly known as: COZAAR TAKE 1 TABLET (25 MG TOTAL) BY MOUTH DAILY. What changed: when to take this   nitroGLYCERIN 0.4 MG SL tablet Commonly known as: NITROSTAT Place 1 tablet (0.4 mg total) under the tongue every 5 (five) minutes as needed for chest pain.   omeprazole 20 MG capsule Commonly known as: PRILOSEC Take 20 mg by mouth every morning.   PRESCRIPTION MEDICATION Inhale into the lungs See admin instructions. CPAP - use every night and as needed for shortness of breath during the day   REFRESH OP Place 1 drop into both eyes at bedtime. Preservative free   rosuvastatin 20 MG tablet Commonly known as: CRESTOR Take 1 tablet (20 mg total) by mouth daily. What changed: when to take this   sodium chloride 0.65 % nasal spray Commonly known as: OCEAN Place 2 sprays into the nose at bedtime.   vitamin C 1000 MG tablet Take 1,000 mg by mouth 2  (two) times daily.               Discharge Care Instructions  (From admission, onward)           Start     Ordered   08/09/21 0000  Discharge wound care:       Comments: Change dry dressing to wound daily and as needed for saturation   08/09/21 1227            Follow-up Information     Surgery, [place]. Go on 08/22/2021.   Specialty: General Surgery Why: Follow up with our PA in the office at 10:15 AM for removal of drain and wound check. Please arrive 30 min early to check  in and have ID and insurance card with you. Contact information: [address] [place] [address] [phone]                Allergies  Allergen Reactions   Dulaglutide Shortness Of Breath   Ozempic (0.25 Or 0.5 Mg-Dose) [Semaglutide(0.25 Or 0.5mg -Dos)] Nausea And Vomiting   Meclizine Other (See Comments)    Psychotic episode   Prednisone Other (See Comments)    hyperglycemia   Quinolones Other (See Comments)    severe muscle aches    Consultations: General surgery  Procedures: Abdominal wall abscess I&D w/ penrose drain placement, 08/09/21  Discharge Exam: BP (!) 124/57 (BP Location: Left Arm)   Pulse 61   Temp 98.2 F (36.8 C) (Oral)   Resp 17   Ht 5\' 8"  (1.727 m)   Wt 122.3 kg   SpO2 97%   BMI 41.00 kg/m  Physical Exam Constitutional:      General: She is not in acute distress.    Appearance: Normal appearance.  HENT:     Head: Normocephalic and atraumatic.     Mouth/Throat:     Mouth: Mucous membranes are moist.  Eyes:     Extraocular Movements: Extraocular movements intact.  Cardiovascular:     Rate and Rhythm: Normal rate and regular rhythm.  Pulmonary:     Effort: Pulmonary effort is normal. No respiratory distress.     Breath sounds: Normal breath sounds.  Abdominal:     General: Bowel sounds are normal. There is no distension.     Palpations: Abdomen is soft.     Tenderness: There is no abdominal tenderness.     Comments:  Abdominal wall incision noted with penrose drain in place; no surrounding erythema; mild induration around site; minimal TTP; dry gauze in place over incision  Musculoskeletal:        General: Normal range of motion.     Cervical back: Normal range of motion and neck supple.  Skin:    General: Skin is warm and dry.  Neurological:     General: No focal deficit present.     Mental Status: She is alert.  Psychiatric:        Mood and Affect: Mood normal.     The results of significant diagnostics from this hospitalization (including imaging, microbiology, ancillary and laboratory) are listed below for reference.   Microbiology: Recent Results (from the past 240 hour(s))  Culture, blood (single)     Status: None (Preliminary result)   Collection Time: 08/06/21  2:53 PM   Specimen: BLOOD  Result Value Ref Range Status   Specimen Description BLOOD LEFT ANTECUBITAL  Final   Special Requests   Final    BOTTLES DRAWN AEROBIC AND ANAEROBIC Blood Culture adequate volume   Culture   Final    NO GROWTH 3 DAYS Performed at [facility], [address] [place], [place] [address]    Report Status PENDING  Incomplete  Aerobic/Anaerobic Culture w Gram Stain (surgical/deep wound)     Status: None (Preliminary result)   Collection Time: 08/06/21  6:17 PM   Specimen: Abscess  Result Value Ref Range Status   Specimen Description ABSCESS  Final   Special Requests NONE  Final   Gram Stain   Final    NO SQUAMOUS EPITHELIAL CELLS SEEN FEW WBC SEEN MODERATE GRAM NEGATIVE RODS ABUNDANT GRAM POSITIVE COCCI Performed at [facility], [address], [place], [place] [address]  Culture   Final    ABUNDANT STREPTOCOCCUS INTERMEDIUS NO ANAEROBES ISOLATED; CULTURE IN PROGRESS FOR 5 DAYS    Report Status PENDING  Incomplete   Organism ID, Bacteria STREPTOCOCCUS INTERMEDIUS  Final      Susceptibility   Streptococcus intermedius - MIC*    PENICILLIN <=0.06 SENSITIVE Sensitive      CEFTRIAXONE <=0.12 SENSITIVE Sensitive     ERYTHROMYCIN >=8 RESISTANT Resistant     LEVOFLOXACIN 0.5 SENSITIVE Sensitive     VANCOMYCIN 0.25 SENSITIVE Sensitive     * ABUNDANT STREPTOCOCCUS INTERMEDIUS     Labs: BNP (last 3 results) No results for input(s): BNP in the last 8760 hours. Basic Metabolic Panel: Recent Labs  Lab 08/06/21 1407 08/06/21 2337 08/07/21 0801 08/08/21 0329 08/09/21 0312  NA 139 136 137 134* 141  K 4.7 5.2* 5.3* 4.5 3.9  CL 103 105 104 102 108  CO2 28 23 24 25 26   GLUCOSE 230* 177* 400* 290* 94  BUN 20 23 28* 40* 35*  CREATININE 1.67* 1.66* 1.68* 1.91* 1.83*  CALCIUM 9.3 8.9 8.3* 8.4* 8.3*   Liver Function Tests: Recent Labs  Lab 08/06/21 1407  AST 18  ALT 14  ALKPHOS 84  BILITOT 1.0  PROT 7.1  ALBUMIN 3.8   Recent Labs  Lab 08/06/21 1407  LIPASE 26   No results for input(s): AMMONIA in the last 168 hours. CBC: Recent Labs  Lab 08/06/21 1407 08/06/21 2337 08/07/21 0801 08/08/21 0329 08/09/21 0312  WBC 11.2* 12.9* 11.1* 10.3 6.8  HGB 12.5 11.4* 10.7* 10.4* 10.3*  HCT 38.2 36.1 33.3* 31.8* 31.7*  MCV 88.8 89.6 89.8 88.6 88.8  PLT 218 218 200 241 231   Cardiac Enzymes: No results for input(s): CKTOTAL, CKMB, CKMBINDEX, TROPONINI in the last 168 hours. BNP: Invalid input(s): POCBNP CBG: Recent Labs  Lab 08/08/21 1157 08/08/21 1850 08/08/21 2041 08/09/21 0759 08/09/21 1205  GLUCAP 150* 180* 124* 76 88   D-Dimer No results for input(s): DDIMER in the last 72 hours. Hgb A1c No results for input(s): HGBA1C in the last 72 hours. Lipid Profile No results for input(s): CHOL, HDL, LDLCALC, TRIG, CHOLHDL, LDLDIRECT in the last 72 hours. Thyroid function studies No results for input(s): TSH, T4TOTAL, T3FREE, THYROIDAB in the last 72 hours.  Invalid input(s): FREET3 Anemia work up No results for input(s): VITAMINB12, FOLATE, FERRITIN, TIBC, IRON, RETICCTPCT in the last 72 hours. Urinalysis No results found for: COLORURINE,  APPEARANCEUR, [place], [place], [place], [place], [place], [place], PROTEINUR, UROBILINOGEN, NITRITE, LEUKOCYTESUR Sepsis Labs Invalid input(s): PROCALCITONIN,  WBC,  LACTICIDVEN Microbiology Recent Results (from the past 240 hour(s))  Culture, blood (single)     Status: None (Preliminary result)   Collection Time: 08/06/21  2:53 PM   Specimen: BLOOD  Result Value Ref Range Status   Specimen Description BLOOD LEFT ANTECUBITAL  Final   Special Requests   Final    BOTTLES DRAWN AEROBIC AND ANAEROBIC Blood Culture adequate volume   Culture   Final    NO GROWTH 3 DAYS Performed at [facility], [address], [place], [place] [address]    Report Status PENDING  Incomplete  Aerobic/Anaerobic Culture w Gram Stain (surgical/deep wound)     Status: None (Preliminary result)   Collection Time: 08/06/21  6:17 PM   Specimen: Abscess  Result Value Ref Range Status   Specimen Description ABSCESS  Final   Special Requests NONE  Final   Gram Stain   Final    NO SQUAMOUS  EPITHELIAL CELLS SEEN FEW WBC SEEN MODERATE GRAM NEGATIVE RODS ABUNDANT GRAM POSITIVE COCCI Performed at [facility], [place] [address], [place], [place] [address]    Culture   Final    ABUNDANT STREPTOCOCCUS INTERMEDIUS NO ANAEROBES ISOLATED; CULTURE IN PROGRESS FOR 5 DAYS    Report Status PENDING  Incomplete   Organism ID, Bacteria STREPTOCOCCUS INTERMEDIUS  Final      Susceptibility   Streptococcus intermedius - MIC*    PENICILLIN <=0.06 SENSITIVE Sensitive     CEFTRIAXONE <=0.12 SENSITIVE Sensitive     ERYTHROMYCIN >=8 RESISTANT Resistant     LEVOFLOXACIN 0.5 SENSITIVE Sensitive     VANCOMYCIN 0.25 SENSITIVE Sensitive     * ABUNDANT STREPTOCOCCUS INTERMEDIUS    Procedures/Studies: No results found.   Time coordinating discharge: Over 30 minutes    Dwyane Dee, MD  [facility] 08/09/2021, 4:38 PM

## 2021-08-09 NOTE — Progress Notes (Addendum)
PT Cancellation Note  Patient Details Name: Suzanne Patton MRN: [id number] DOB: 1950-07-01   Cancelled Treatment:    Reason Eval/Treat Not Completed: Other (comment), pt eating breakfast and stating she "feels off" from pain medication and requesting this PTA come back. Will re-attempt as schedule allows to continue with PT POC.  1:54 PM: pt declining mobility, stating tiredness/lethargy from poor nights rest last 2 nights and meds. Will re-attempt tomorrow to continue with PT POC.     Marlana Salvage Shahzain Kiester 08/09/2021, 9:35 AM

## 2021-08-11 LAB — CULTURE, BLOOD (SINGLE)
Culture: NO GROWTH
Special Requests: ADEQUATE

## 2021-08-12 LAB — AEROBIC/ANAEROBIC CULTURE W GRAM STAIN (SURGICAL/DEEP WOUND): Gram Stain: NONE SEEN

## 2021-08-22 DIAGNOSIS — Z9889 Other specified postprocedural states: Secondary | ICD-10-CM | POA: Diagnosis not present

## 2021-08-22 DIAGNOSIS — L02211 Cutaneous abscess of abdominal wall: Secondary | ICD-10-CM | POA: Diagnosis not present

## 2021-08-23 DIAGNOSIS — E1159 Type 2 diabetes mellitus with other circulatory complications: Secondary | ICD-10-CM | POA: Diagnosis not present

## 2021-08-23 DIAGNOSIS — Z09 Encounter for follow-up examination after completed treatment for conditions other than malignant neoplasm: Secondary | ICD-10-CM | POA: Diagnosis not present

## 2021-08-23 DIAGNOSIS — L02211 Cutaneous abscess of abdominal wall: Secondary | ICD-10-CM | POA: Diagnosis not present

## 2021-08-23 DIAGNOSIS — N1832 Chronic kidney disease, stage 3b: Secondary | ICD-10-CM | POA: Diagnosis not present

## 2021-08-25 ENCOUNTER — Other Ambulatory Visit: Payer: Self-pay | Admitting: Cardiology

## 2021-08-25 DIAGNOSIS — I10 Essential (primary) hypertension: Secondary | ICD-10-CM

## 2021-08-29 DIAGNOSIS — R6889 Other general symptoms and signs: Secondary | ICD-10-CM | POA: Diagnosis not present

## 2021-08-31 DIAGNOSIS — E1159 Type 2 diabetes mellitus with other circulatory complications: Secondary | ICD-10-CM | POA: Diagnosis not present

## 2021-08-31 DIAGNOSIS — I129 Hypertensive chronic kidney disease with stage 1 through stage 4 chronic kidney disease, or unspecified chronic kidney disease: Secondary | ICD-10-CM | POA: Diagnosis not present

## 2021-08-31 DIAGNOSIS — K219 Gastro-esophageal reflux disease without esophagitis: Secondary | ICD-10-CM | POA: Diagnosis not present

## 2021-08-31 DIAGNOSIS — E7800 Pure hypercholesterolemia, unspecified: Secondary | ICD-10-CM | POA: Diagnosis not present

## 2021-09-14 ENCOUNTER — Other Ambulatory Visit: Payer: Medicare Other

## 2021-09-25 ENCOUNTER — Ambulatory Visit: Payer: Medicare Other | Admitting: Cardiology

## 2021-09-25 ENCOUNTER — Encounter: Payer: Self-pay | Admitting: Cardiology

## 2021-09-25 VITALS — BP 132/54 | HR 67 | Temp 97.8°F | Resp 16 | Ht 68.0 in | Wt 261.8 lb

## 2021-09-25 DIAGNOSIS — I10 Essential (primary) hypertension: Secondary | ICD-10-CM | POA: Diagnosis not present

## 2021-09-25 DIAGNOSIS — R0609 Other forms of dyspnea: Secondary | ICD-10-CM | POA: Diagnosis not present

## 2021-09-25 DIAGNOSIS — I2510 Atherosclerotic heart disease of native coronary artery without angina pectoris: Secondary | ICD-10-CM | POA: Diagnosis not present

## 2021-09-25 DIAGNOSIS — E782 Mixed hyperlipidemia: Secondary | ICD-10-CM

## 2021-09-25 NOTE — Progress Notes (Signed)
Patient referred by [name], DO for coronary artery disease  Subjective:   Suzanne Patton, female    DOB: 22-Feb-1951, 71 y.o.   MRN: [id number]   Chief Complaint  Patient presents with   Valve disease   Hypertension   Hyperlipidemia   Coronary Artery Disease   Follow-up    6 months     HPI  71 y.o. Caucasian female with hypertension, hyperlipidemia, type 2 DM, CAD, SVT, bipolar disorder, OSA on CPAP, morbid obesity  Patient is recovering from [place] recently, continues to have exertional dyspnea but denies any chest pain.  Blood pressure is well controlled.  Reviewed recent lab results with patient, details below.  Initial consultation HPI 07/2020: Patient has known CAD with PCI in 2012, has h/o SVT with ablations, all performed in [place].   Patient moved to [place] in July 2020 to be with her daughter.  Most recently, she lived in [place], although she has previously lived in several different states.  Her last visit with her cardiologist in [place] was in September 2020.  Patient has had left-sided chest pressure symptoms with physical and emotional stress, relieved with stress, for long time.  This had been stable, but increased this past winter.  Patient has 2-3 episodes a week.  She is not on any short or long-acting nitroglycerin.  Patient reports episodes of lightheadedness followed by syncope, most recent episode 3 weeks ago.  She has several other episodes of aborted syncope.  She denies any chest pain or shortness of breath prior to her syncope episode.  She is reportedly undergone extensive neurological work-up in [place], which was reportedly unyielding.  Her diabetes remains uncontrolled.  She is now established care with Dr. [name], with whom she is working actively on management of her diabetes.   Current Outpatient Medications:    albuterol (VENTOLIN HFA) 108 (90 Base) MCG/ACT inhaler, 1-2 puffs 4 (four) times daily as needed for wheezing or shortness  of breath., Disp: , Rfl:    Ascorbic Acid (VITAMIN C) 1000 MG tablet, Take 1,000 mg by mouth 2 (two) times daily., Disp: , Rfl:    aspirin 81 MG EC tablet, Take 81 mg by mouth at bedtime., Disp: , Rfl:    CALCIUM CITRATE PO, Take 2 tablets by mouth at bedtime., Disp: , Rfl:    citalopram (CELEXA) 20 MG tablet, Take 20 mg by mouth every morning., Disp: , Rfl:    clonazePAM (KLONOPIN) 0.5 MG tablet, Take 0.75 mg by mouth at bedtime., Disp: , Rfl:    diclofenac Sodium (VOLTAREN) 1 % GEL, Apply 1 application. topically at bedtime as needed (pain)., Disp: , Rfl:    diltiazem (CARDIZEM CD) 120 MG 24 hr capsule, TAKE 1 CAPSULE BY MOUTH EVERY DAY (Patient taking differently: 120 mg every morning.), Disp: 90 capsule, Rfl: 1   ezetimibe (ZETIA) 10 MG tablet, Take 10 mg by mouth every morning., Disp: , Rfl:    fluticasone (FLONASE) 50 MCG/ACT nasal spray, 1 spray daily as needed for allergies or rhinitis., Disp: , Rfl:    ibuprofen (ADVIL) 200 MG tablet, Take 800 mg by mouth daily as needed (pain)., Disp: , Rfl:    insulin detemir (LEVEMIR) 100 UNIT/ML injection, Inject 60 Units into the skin 2 (two) times daily., Disp: , Rfl:    insulin lispro (HUMALOG) 100 UNIT/ML injection, 5-30 Units See admin instructions. Inject 5-30 units subcutaneously up to 5 times daily - sliding scale is based on CBG, Disp: , Rfl:  labetalol (NORMODYNE) 300 MG tablet, Take 300 mg by mouth in the morning and at bedtime., Disp: , Rfl:    lamoTRIgine (LAMICTAL) 200 MG tablet, Take 200 mg by mouth 2 (two) times daily., Disp: , Rfl:    levocetirizine (XYZAL) 5 MG tablet, Take 5 mg by mouth at bedtime., Disp: , Rfl:    losartan (COZAAR) 25 MG tablet, TAKE 1 TABLET (25 MG TOTAL) BY MOUTH DAILY., Disp: 90 tablet, Rfl: 1   nitroGLYCERIN (NITROSTAT) 0.4 MG SL tablet, Place 1 tablet (0.4 mg total) under the tongue every 5 (five) minutes as needed for chest pain., Disp: 30 tablet, Rfl: 3   Omega-3 Fatty Acids (FISH OIL OMEGA-3 PO), Take 2  capsules by mouth at bedtime., Disp: , Rfl:    omeprazole (PRILOSEC) 20 MG capsule, Take 20 mg by mouth every morning., Disp: , Rfl:    Polyvinyl Alcohol-Povidone (REFRESH OP), Place 1 drop into both eyes at bedtime. Preservative free, Disp: , Rfl:    PRESCRIPTION MEDICATION, Inhale into the lungs See admin instructions. CPAP - use every night and as needed for shortness of breath during the day, Disp: , Rfl:    rosuvastatin (CRESTOR) 20 MG tablet, Take 1 tablet (20 mg total) by mouth daily. (Patient taking differently: Take 20 mg by mouth every morning.), Disp: 90 tablet, Rfl: 3   sodium chloride (OCEAN) 0.65 % nasal spray, Place 2 sprays into the nose at bedtime., Disp: , Rfl:   Cardiovascular and other pertinent studies:  EKG 09/25/2021: Sinus rhythm 66 bpm Poor R-wave progression  Lexiscan Tetrofosmin stress test 09/05/2020: Lexiscan nuclear stress test performed using 1-day protocol. Patient reported 2/10 chest pain, dyspnea, and dizziness. SPECT images show small sized, mild intensity, reversible perfusion defect in basal anteroseptal myocardium. Stress LVEF is calculated 49%, but visually appears normal. Low risk study.  Echocardiogram 09/05/2020:  Left ventricle cavity is normal in size. Moderate concentric hypertrophy  of the left ventricle. Normal global wall motion. Normal LV systolic  function with EF 60%. Normal diastolic filling pattern.  Structurally normal trileaflet aortic valve. No evidence of aortic  stenosis. Moderate (Grade II) aortic regurgitation.  Moderate (Grade II) mitral regurgitation.  Mild tricuspid regurgitation. Estimated pulmonary artery systolic pressure  26 mmHg.  Mild pulmonic regurgitation.  Mobile cardiac telemetry 13 days 07/25/2020 - 08/08/2020: Dominant rhythm: Sinus. HR 58-150 bpm. Avg HR 71 bpm. 37 episodes of SVT, fastest at 126 bpm for 9 beats, longest for 12 beats at 105 bpm. <1% isolated SVE, couplet/triplets. 1 episode of VT, at 150 bpm for 4  beats <1% isolated VE, no couplet/triplets. No atrial fibrillation/atrial flutter/high grade AV block, sinus pause >3sec noted. 34 patient triggered events, correlated with sinus rhythm without any arrhthymias.  EKG 07/25/2020: Sinus rhythm 66 bpm Poor R-wave progression, otherwise normal EKG   Recent labs: 05/30/2021: Glucose 194, BUN/Cr 20/1.52. EGFR 39. Na/K 141/4.9. Rest of the CMP normal H/H 12/37. MCV 88. Platelets 236 HbA1C 7.2% Chol 158, TG 117, HDL 71, LDL 64  06/17/2020: Glucose 259, BUN/Cr 19/1.3. EGFR 44. Na/K 141/5.2. AlKP 132. Rest of the CMP normal H/H 13/41. MCV 87. Platelets 250 HbA1C 8.0% Chol 268, TG 181, HDL 81, LDL 155 TSH 1.5 normal    Review of Systems  Cardiovascular:  Positive for dyspnea on exertion. Negative for chest pain, leg swelling, palpitations and syncope (No recurrence).  Neurological:  Negative for light-headedness.         Vitals:   09/25/21 1035  BP: (!) 132/54  Pulse: 67  Resp: 16  Temp: 97.8 F (36.6 C)  SpO2: 98%     Body mass index is 39.81 kg/m. Filed Weights   09/25/21 1035  Weight: 261 lb 12.8 oz (118.8 kg)     Objective:   Physical Exam Vitals and nursing note reviewed.  Constitutional:      General: She is not in acute distress. Neck:     Vascular: No JVD.  Cardiovascular:     Rate and Rhythm: Normal rate and regular rhythm.     Heart sounds: Normal heart sounds. No murmur heard. Pulmonary:     Effort: Pulmonary effort is normal.     Breath sounds: Normal breath sounds. No wheezing or rales.  Musculoskeletal:     Right lower leg: No edema.     Left lower leg: No edema.  Neurological:     Mental Status: She is alert.         Assessment & Recommendations:   71 y.o. Caucasian female with hypertension, hyperlipidemia, type 2 DM, CAD, SVT, bipolar disorder, OSA on CPAP, morbid obesity  Exertional dyspnea: Likely multifactorial in the setting of recent COVID, obesity, deconditioning. Suspicion for  angina equivalent. No ischemia on stress testing (08/2020) Continue Aspirin 81 mg, Crestor 20 mg, diltiazem 120 mg, labetalol 150 mg bid, SL NTG prn  Hypertension: Well-controlled  Mixed hyperlipidemia: Well-controlled  F/u in 4 weeks w/ Lawerance Cruel, PA    Nigel Mormon, MD Pager: [phone] Office: [phone]

## 2021-09-28 ENCOUNTER — Ambulatory Visit: Payer: Medicare Other | Admitting: Student

## 2021-10-02 ENCOUNTER — Ambulatory Visit (INDEPENDENT_AMBULATORY_CARE_PROVIDER_SITE_OTHER): Payer: Medicare Other | Admitting: Ophthalmology

## 2021-10-02 ENCOUNTER — Encounter (INDEPENDENT_AMBULATORY_CARE_PROVIDER_SITE_OTHER): Payer: Self-pay | Admitting: Ophthalmology

## 2021-10-02 ENCOUNTER — Encounter (INDEPENDENT_AMBULATORY_CARE_PROVIDER_SITE_OTHER): Payer: Medicare Other | Admitting: Ophthalmology

## 2021-10-02 DIAGNOSIS — E113411 Type 2 diabetes mellitus with severe nonproliferative diabetic retinopathy with macular edema, right eye: Secondary | ICD-10-CM | POA: Diagnosis not present

## 2021-10-02 DIAGNOSIS — E113412 Type 2 diabetes mellitus with severe nonproliferative diabetic retinopathy with macular edema, left eye: Secondary | ICD-10-CM

## 2021-10-02 DIAGNOSIS — E113413 Type 2 diabetes mellitus with severe nonproliferative diabetic retinopathy with macular edema, bilateral: Secondary | ICD-10-CM

## 2021-10-02 NOTE — Progress Notes (Signed)
10/02/2021     CHIEF COMPLAINT Patient presents for  Chief Complaint  Patient presents with   Diabetic Retinopathy with Macular Edema      HISTORY OF PRESENT ILLNESS: Suzanne Patton is a 71 y.o. female who presents to the clinic today for:   HPI   4 MOS for DILATE OU, OCT, OPTOS FFA R/L Pt stated, "I had surgery May 21st and it was an emergency procedure. [place] [employer] and broke my glasses but [employer] gave me money to replace. My vision has improve because I got my prescription strengthened. My blood sugar has been weird lately. It has been up and down but lately it has been really high". Last edited by Angeline Slim on 10/02/2021  3:53 PM.      Referring physician: Jethro Bolus, MD [address] [place],  [place] [address]  HISTORICAL INFORMATION:   Selected notes from the MEDICAL RECORD NUMBER       CURRENT MEDICATIONS: Current Outpatient Medications (Ophthalmic Drugs)  Medication Sig   Polyvinyl Alcohol-Povidone (REFRESH OP) Place 1 drop into both eyes at bedtime. Preservative free   No current facility-administered medications for this visit. (Ophthalmic Drugs)   Current Outpatient Medications (Other)  Medication Sig   albuterol (VENTOLIN HFA) 108 (90 Base) MCG/ACT inhaler 1-2 puffs 4 (four) times daily as needed for wheezing or shortness of breath.   Ascorbic Acid (VITAMIN C) 1000 MG tablet Take 1,000 mg by mouth 2 (two) times daily.   aspirin 81 MG EC tablet Take 81 mg by mouth at bedtime.   CALCIUM CITRATE PO Take 2 tablets by mouth at bedtime.   citalopram (CELEXA) 20 MG tablet Take 20 mg by mouth every morning.   clonazePAM (KLONOPIN) 0.5 MG tablet Take 0.75 mg by mouth at bedtime.   diclofenac Sodium (VOLTAREN) 1 % GEL Apply 1 application. topically at bedtime as needed (pain).   diltiazem (CARDIZEM CD) 120 MG 24 hr capsule TAKE 1 CAPSULE BY MOUTH EVERY DAY (Patient taking differently: 120 mg every morning.)   ezetimibe (ZETIA) 10 MG tablet Take 10 mg by mouth  every morning.   fluticasone (FLONASE) 50 MCG/ACT nasal spray 1 spray daily as needed for allergies or rhinitis.   ibuprofen (ADVIL) 200 MG tablet Take 800 mg by mouth daily as needed (pain).   insulin detemir (LEVEMIR) 100 UNIT/ML injection Inject 60 Units into the skin 2 (two) times daily.   insulin lispro (HUMALOG) 100 UNIT/ML injection 5-30 Units See admin instructions. Inject 5-30 units subcutaneously up to 5 times daily - sliding scale is based on CBG   labetalol (NORMODYNE) 300 MG tablet Take 300 mg by mouth in the morning and at bedtime.   lamoTRIgine (LAMICTAL) 200 MG tablet Take 200 mg by mouth 2 (two) times daily.   levocetirizine (XYZAL) 5 MG tablet Take 5 mg by mouth at bedtime.   losartan (COZAAR) 25 MG tablet TAKE 1 TABLET (25 MG TOTAL) BY MOUTH DAILY.   nitroGLYCERIN (NITROSTAT) 0.4 MG SL tablet Place 1 tablet (0.4 mg total) under the tongue every 5 (five) minutes as needed for chest pain.   Omega-3 Fatty Acids (FISH OIL OMEGA-3 PO) Take 2 capsules by mouth at bedtime.   omeprazole (PRILOSEC) 20 MG capsule Take 20 mg by mouth every morning.   PRESCRIPTION MEDICATION Inhale into the lungs See admin instructions. CPAP - use every night and as needed for shortness of breath during the day   rosuvastatin (CRESTOR) 20 MG tablet Take 1  tablet (20 mg total) by mouth daily. (Patient taking differently: Take 20 mg by mouth every morning.)   sodium chloride (OCEAN) 0.65 % nasal spray Place 2 sprays into the nose at bedtime.   No current facility-administered medications for this visit. (Other)      REVIEW OF SYSTEMS: ROS   Negative for: Constitutional, Gastrointestinal, Neurological, Skin, Genitourinary, Musculoskeletal, HENT, Endocrine, Cardiovascular, Eyes, Respiratory, Psychiatric, Allergic/Imm, Heme/Lymph Last edited by Silvestre Moment on 10/02/2021  3:52 PM.       ALLERGIES Allergies  Allergen Reactions   Dulaglutide Shortness Of Breath   Ozempic (0.25 Or 0.5 Mg-Dose)  [Semaglutide(0.25 Or 0.5mg -Dos)] Nausea And Vomiting   Meclizine Other (See Comments)    Psychotic episode   Prednisone Other (See Comments)    hyperglycemia   Quinolones Other (See Comments)    severe muscle aches    PAST MEDICAL HISTORY Past Medical History:  Diagnosis Date   Blood transfusion declined because patient is Jehovah's Witness    CAD (coronary artery disease)    Complication of anesthesia    Diabetes mellitus without complication (HCC)    Hyperlipidemia    Hypertension    Neuromuscular disorder ([place])    PVD (peripheral vascular disease) ([place])    s/p carotid stent   Sleep apnea    Stage 3b chronic kidney disease (CKD) ([place])    Past Surgical History:  Procedure Laterality Date   ABLATION     CAROTID STENT     CHOLECYSTECTOMY     HYSTEROTOMY     IRRIGATION AND DEBRIDEMENT ABSCESS N/A 08/06/2021   Procedure: IRRIGATION AND DEBRIDEMENT ABDOMINAL WALL  ABSCESS;  Surgeon: Ileana Roup, MD;  Location: [facility] OR;  Service: General;  Laterality: N/A;   ROTATOR CUFF REPAIR Right     FAMILY HISTORY Family History  Problem Relation Age of Onset   Atrial fibrillation Mother    Kidney failure Mother    Heart disease Father    Heart attack Father    Heart failure Father    COPD Father     SOCIAL HISTORY Social History   Tobacco Use   Smoking status: Never   Smokeless tobacco: Never  Vaping Use   Vaping Use: Never used  Substance Use Topics   Alcohol use: Yes    Comment: rare   Drug use: Never         OPHTHALMIC EXAM:  Base Eye Exam     Visual Acuity (ETDRS)       Right Left   Dist cc 20/40 -2 20/50   Dist ph cc 20/25 -2 20/30    Correction: Glasses         Tonometry (Tonopen, 4:13 PM)       Right Left   Pressure 10 12         Pupils       Pupils APD   Right PERRL None   Left PERRL None         Visual Fields       Left Right    Full Full         Extraocular Movement       Right Left    Full Full          Neuro/Psych     Oriented x3: Yes   Mood/Affect: Normal         Dilation     Both eyes: 1.0% Mydriacyl, 2.5% Phenylephrine @ 4:13 PM           Slit  Lamp and Fundus Exam     External Exam       Right Left   External Normal Normal         Slit Lamp Exam       Right Left   Lids/Lashes Normal Normal   Conjunctiva/Sclera White and quiet White and quiet   Cornea Clear Clear   Anterior Chamber Deep and quiet Deep and quiet   Iris Round and reactive Round and reactive   Lens Centered posterior chamber intraocular lens, 1+ Posterior capsular opacification Centered posterior chamber intraocular lens   Anterior Vitreous Normal Normal         Fundus Exam       Right Left   Posterior Vitreous Normal Normal   Disc Normal Normal   C/D Ratio 0.45 0.6   Macula Microaneurysms Microaneurysms, Mild clinically significant macular edema   Vessels NPDR-Severe NPDR-Severe   Periphery Normal Normal            IMAGING AND PROCEDURES  Imaging and Procedures for 10/02/21  OCT, Retina - OU - Both Eyes       Right Eye Quality was good. Scan locations included subfoveal. Central Foveal Thickness: 273. Progression has been stable. Findings include abnormal foveal contour.   Left Eye Quality was borderline. Scan locations included subfoveal. Central Foveal Thickness: 289. Progression has been stable. Findings include abnormal foveal contour.   Notes OS watch inferonasal to FAZ.  There is a smaller region of CSME does need fluorescein angiography to quantify this region near future.  Patient was ill earlier requiring glucose supplementation of the office we will reschedule for 2 weeks             ASSESSMENT/PLAN:  Diabetic macular edema of both eyes with severe nonproliferative retinopathy associated with type 2 diabetes mellitus (HCC) Severe NPDR OU continues.  Severe nonproliferative diabetic retinopathy of left eye, with macular edema, associated with type 2  diabetes mellitus (HCC) Minor CSME has recurrent inferonasal to FAZ will need fluorescein angiography soon when patient feeling better  Severe nonproliferative diabetic retinopathy of right eye, with macular edema, associated with type 2 diabetes mellitus (HCC) Schedule FFA within the next couple weeks     ICD-10-CM   1. Severe nonproliferative diabetic retinopathy of right eye, with macular edema, associated with type 2 diabetes mellitus (HCC)  E11.3411 OCT, Retina - OU - Both Eyes    CANCELED: Fluorescein Angiography Optos (Transit OD)    2. Diabetic macular edema of both eyes with severe nonproliferative retinopathy associated with type 2 diabetes mellitus (HCC)  Q59.5638     3. Severe nonproliferative diabetic retinopathy of left eye, with macular edema, associated with type 2 diabetes mellitus (HCC)  V56.4332       1.  Patient required some glucose assistance in the form of a granola bar today and has improved.  Nonetheless we will not perform fluorescein angiography today is contemplated.  2.  Severe NPDR and minor CSME has returned in the left eye.  We will need reevaluation with fluorescein angiography very soon and likely injection left eye upon next visit.  3.  Ophthalmic Meds Ordered this visit:  No orders of the defined types were placed in this encounter.      Return in about 2 weeks (around 10/16/2021) for DILATE OU, OPTOS FFA L/R, COLOR FP, AVASTIN OCT, OS.  There are no Patient Instructions on file for this visit.   Explained the diagnoses, plan, and follow up with the patient and  they expressed understanding.  Patient expressed understanding of the importance of proper follow up care.   Clent Demark Shawntell Dixson M.D. Diseases & Surgery of the Retina and Vitreous [facility] [place] 10/02/21     Abbreviations: M myopia (nearsighted); A astigmatism; H hyperopia (farsighted); P presbyopia; Mrx spectacle prescription;  CTL contact lenses; OD right eye; OS left  eye; OU both eyes  XT exotropia; ET esotropia; PEK punctate epithelial keratitis; PEE punctate epithelial erosions; DES dry eye syndrome; MGD meibomian gland dysfunction; ATs artificial tears; PFAT's preservative free artificial tears; [place] nuclear sclerotic cataract; PSC posterior subcapsular cataract; ERM epi-retinal membrane; PVD posterior vitreous detachment; RD retinal detachment; DM diabetes mellitus; DR diabetic retinopathy; NPDR non-proliferative diabetic retinopathy; PDR proliferative diabetic retinopathy; CSME clinically significant macular edema; DME diabetic macular edema; dbh dot blot hemorrhages; CWS cotton wool spot; POAG primary open angle glaucoma; C/D cup-to-disc ratio; HVF humphrey visual field; GVF goldmann visual field; OCT optical coherence tomography; IOP intraocular pressure; BRVO Branch retinal vein occlusion; CRVO central retinal vein occlusion; CRAO central retinal artery occlusion; BRAO branch retinal artery occlusion; RT retinal tear; SB scleral buckle; PPV pars plana vitrectomy; VH Vitreous hemorrhage; PRP panretinal laser photocoagulation; IVK intravitreal kenalog; VMT vitreomacular traction; MH Macular hole;  NVD neovascularization of the disc; NVE neovascularization elsewhere; AREDS age related eye disease study; ARMD age related macular degeneration; POAG primary open angle glaucoma; EBMD epithelial/anterior basement membrane dystrophy; ACIOL anterior chamber intraocular lens; IOL intraocular lens; PCIOL posterior chamber intraocular lens; Phaco/IOL phacoemulsification with intraocular lens placement; [place] photorefractive keratectomy; LASIK laser assisted in situ keratomileusis; HTN hypertension; DM diabetes mellitus; COPD chronic obstructive pulmonary disease

## 2021-10-02 NOTE — Assessment & Plan Note (Signed)
Minor CSME has recurrent inferonasal to FAZ will need fluorescein angiography soon when patient feeling better

## 2021-10-02 NOTE — Assessment & Plan Note (Signed)
Schedule FFA within the next couple weeks

## 2021-10-02 NOTE — Assessment & Plan Note (Signed)
Severe NPDR OU continues.

## 2021-10-04 DIAGNOSIS — G4733 Obstructive sleep apnea (adult) (pediatric): Secondary | ICD-10-CM | POA: Diagnosis not present

## 2021-10-05 DIAGNOSIS — N1832 Chronic kidney disease, stage 3b: Secondary | ICD-10-CM | POA: Diagnosis not present

## 2021-10-05 DIAGNOSIS — I10 Essential (primary) hypertension: Secondary | ICD-10-CM | POA: Diagnosis not present

## 2021-10-05 DIAGNOSIS — E1140 Type 2 diabetes mellitus with diabetic neuropathy, unspecified: Secondary | ICD-10-CM | POA: Diagnosis not present

## 2021-10-05 DIAGNOSIS — E1159 Type 2 diabetes mellitus with other circulatory complications: Secondary | ICD-10-CM | POA: Diagnosis not present

## 2021-10-08 ENCOUNTER — Other Ambulatory Visit: Payer: Self-pay | Admitting: Student

## 2021-10-08 DIAGNOSIS — I25118 Atherosclerotic heart disease of native coronary artery with other forms of angina pectoris: Secondary | ICD-10-CM

## 2021-10-13 ENCOUNTER — Emergency Department (HOSPITAL_COMMUNITY)
Admission: EM | Admit: 2021-10-13 | Discharge: 2021-10-13 | Disposition: A | Discharge status: Home / Self Care | Payer: Medicare Other | Attending: Emergency Medicine | Admitting: Emergency Medicine

## 2021-10-13 ENCOUNTER — Emergency Department (HOSPITAL_COMMUNITY): Payer: Medicare Other

## 2021-10-13 ENCOUNTER — Encounter (HOSPITAL_COMMUNITY): Payer: Self-pay

## 2021-10-13 ENCOUNTER — Other Ambulatory Visit: Payer: Self-pay

## 2021-10-13 DIAGNOSIS — W19XXXA Unspecified fall, initial encounter: Secondary | ICD-10-CM

## 2021-10-13 DIAGNOSIS — M542 Cervicalgia: Secondary | ICD-10-CM | POA: Insufficient documentation

## 2021-10-13 DIAGNOSIS — Z7982 Long term (current) use of aspirin: Secondary | ICD-10-CM | POA: Insufficient documentation

## 2021-10-13 DIAGNOSIS — E119 Type 2 diabetes mellitus without complications: Secondary | ICD-10-CM | POA: Diagnosis not present

## 2021-10-13 DIAGNOSIS — I10 Essential (primary) hypertension: Secondary | ICD-10-CM | POA: Insufficient documentation

## 2021-10-13 DIAGNOSIS — Z794 Long term (current) use of insulin: Secondary | ICD-10-CM | POA: Diagnosis not present

## 2021-10-13 DIAGNOSIS — Z043 Encounter for examination and observation following other accident: Secondary | ICD-10-CM | POA: Diagnosis not present

## 2021-10-13 DIAGNOSIS — Z79899 Other long term (current) drug therapy: Secondary | ICD-10-CM | POA: Diagnosis not present

## 2021-10-13 DIAGNOSIS — M47812 Spondylosis without myelopathy or radiculopathy, cervical region: Secondary | ICD-10-CM | POA: Diagnosis not present

## 2021-10-13 DIAGNOSIS — M4692 Unspecified inflammatory spondylopathy, cervical region: Secondary | ICD-10-CM | POA: Diagnosis not present

## 2021-10-13 DIAGNOSIS — R519 Headache, unspecified: Secondary | ICD-10-CM | POA: Diagnosis not present

## 2021-10-13 DIAGNOSIS — R6889 Other general symptoms and signs: Secondary | ICD-10-CM | POA: Diagnosis not present

## 2021-10-13 DIAGNOSIS — S0003XA Contusion of scalp, initial encounter: Secondary | ICD-10-CM | POA: Diagnosis not present

## 2021-10-13 DIAGNOSIS — Z743 Need for continuous supervision: Secondary | ICD-10-CM | POA: Diagnosis not present

## 2021-10-13 DIAGNOSIS — S0990XA Unspecified injury of head, initial encounter: Secondary | ICD-10-CM | POA: Diagnosis present

## 2021-10-13 DIAGNOSIS — R220 Localized swelling, mass and lump, head: Secondary | ICD-10-CM | POA: Diagnosis not present

## 2021-10-13 DIAGNOSIS — I2510 Atherosclerotic heart disease of native coronary artery without angina pectoris: Secondary | ICD-10-CM | POA: Diagnosis not present

## 2021-10-13 LAB — CBC WITH DIFFERENTIAL/PLATELET
Abs Immature Granulocytes: 0.01 10*3/uL (ref 0.00–0.07)
Basophils Absolute: 0 10*3/uL (ref 0.0–0.1)
Basophils Relative: 0 %
Eosinophils Absolute: 0.1 10*3/uL (ref 0.0–0.5)
Eosinophils Relative: 1 %
HCT: 38.5 % (ref 36.0–46.0)
Hemoglobin: 12.3 g/dL (ref 12.0–15.0)
Immature Granulocytes: 0 %
Lymphocytes Relative: 17 %
Lymphs Abs: 1.2 10*3/uL (ref 0.7–4.0)
MCH: 28.4 pg (ref 26.0–34.0)
MCHC: 31.9 g/dL (ref 30.0–36.0)
MCV: 88.9 fL (ref 80.0–100.0)
Monocytes Absolute: 0.3 10*3/uL (ref 0.1–1.0)
Monocytes Relative: 4 %
Neutro Abs: 5.3 10*3/uL (ref 1.7–7.7)
Neutrophils Relative %: 78 %
Platelets: 214 10*3/uL (ref 150–400)
RBC: 4.33 MIL/uL (ref 3.87–5.11)
RDW: 13.3 % (ref 11.5–15.5)
WBC: 6.9 10*3/uL (ref 4.0–10.5)
nRBC: 0 % (ref 0.0–0.2)

## 2021-10-13 LAB — URINALYSIS, ROUTINE W REFLEX MICROSCOPIC
Bilirubin Urine: NEGATIVE
Glucose, UA: NEGATIVE mg/dL
Hgb urine dipstick: NEGATIVE
Ketones, ur: NEGATIVE mg/dL
Leukocytes,Ua: NEGATIVE
Nitrite: NEGATIVE
Protein, ur: NEGATIVE mg/dL
Specific Gravity, Urine: 1.015 (ref 1.005–1.030)
pH: 5 (ref 5.0–8.0)

## 2021-10-13 LAB — CBG MONITORING, ED: Glucose-Capillary: 147 mg/dL — ABNORMAL HIGH (ref 70–99)

## 2021-10-13 LAB — COMPREHENSIVE METABOLIC PANEL
ALT: 16 U/L (ref 0–44)
AST: 21 U/L (ref 15–41)
Albumin: 3.7 g/dL (ref 3.5–5.0)
Alkaline Phosphatase: 72 U/L (ref 38–126)
Anion gap: 9 (ref 5–15)
BUN: 22 mg/dL (ref 8–23)
CO2: 24 mmol/L (ref 22–32)
Calcium: 8.7 mg/dL — ABNORMAL LOW (ref 8.9–10.3)
Chloride: 107 mmol/L (ref 98–111)
Creatinine, Ser: 1.3 mg/dL — ABNORMAL HIGH (ref 0.44–1.00)
GFR, Estimated: 44 mL/min — ABNORMAL LOW (ref >60)
Glucose, Bld: 130 mg/dL — ABNORMAL HIGH (ref 70–99)
Potassium: 4.4 mmol/L (ref 3.5–5.1)
Sodium: 140 mmol/L (ref 135–145)
Total Bilirubin: 0.8 mg/dL (ref 0.3–1.2)
Total Protein: 6.6 g/dL (ref 6.5–8.1)

## 2021-10-13 LAB — TROPONIN I (HIGH SENSITIVITY)
Troponin I (High Sensitivity): 6 ng/L (ref <18)
Troponin I (High Sensitivity): 7 ng/L (ref <18)

## 2021-10-13 MED ORDER — SODIUM CHLORIDE 0.9 % IV SOLN: INTRAVENOUS | Status: DC

## 2021-10-13 NOTE — ED Triage Notes (Addendum)
Pt bib ems from home; pt called ems, at 4 am , pt checked cbg, reports 48; pt satys she ate PB, got back in bed; pt got up this am and fell; hematoma at back of head; does not remember falling, a and o with ems; no neuro deficits; not on thinners; initial glucose 87, gave 15 grams oral glucose; daughter says speech normal; shaky when standing, daughter states baseline when sugar drops; 174/90, P 72, t 74F, rr 18; 20 ga LH; pt c/o R head pain

## 2021-10-13 NOTE — ED Notes (Signed)
Pt ambulatory to and from restroom with steady gait 

## 2021-10-13 NOTE — ED Notes (Signed)
Pt given food and beverage 

## 2021-10-13 NOTE — ED Notes (Signed)
Pt verbalizes understanding of discharge instructions. Opportunity for questions and answers were provided. Pt discharged from the ED.   ?

## 2021-10-13 NOTE — ED Provider Notes (Signed)
[facility] EMERGENCY DEPARTMENT Provider Note   CSN: [id number] Arrival date & time: 10/13/21  0300     History  Chief Complaint  Patient presents with   Fall   Hypoglycemia    Suzanne Patton is a 71 y.o. female.  Pt is a 71 yo female with a pmhx significant for DM, HLD, HTN, CKD, CAD, PVD, ambulatory dysfunction (uses a walker) and sleep apnea.  Pt said she woke up early this am and felt like her bs was low.  She got up to eat some peanut butter.  She checked her bs and sugar was 48.  She got back in bed.  She woke up again, got up and woke up on the floor.  Pt did hit the right side of her head, she has neck pain and a headache.  Pt said she has had a headache and neck pain for a few weeks.  She thinks it's from her cpap mask which does not fit right.  She has an appt today at 3 pm to get a mask adjustment.       Home Medications Prior to Admission medications   Medication Sig Start Date End Date Taking? Authorizing Provider  albuterol (VENTOLIN HFA) 108 (90 Base) MCG/ACT inhaler 1-2 puffs 4 (four) times daily as needed for wheezing or shortness of breath. 08/16/20  Yes [provider]  Ascorbic Acid (VITAMIN C) 1000 MG tablet Take 1,000 mg by mouth 2 (two) times daily.   Yes [provider]  aspirin 81 MG EC tablet Take 81 mg by mouth at bedtime.   Yes [provider]  CALCIUM CITRATE PO Take 2 tablets by mouth at bedtime.   Yes [provider]  citalopram (CELEXA) 20 MG tablet Take 20 mg by mouth every morning. 03/01/20  Yes [provider]  clonazePAM (KLONOPIN) 0.5 MG tablet Take 0.75 mg by mouth at bedtime. 04/30/20   [provider]  diclofenac Sodium (VOLTAREN) 1 % GEL Apply 2 g topically at bedtime as needed (pain).    [provider]  diltiazem (CARDIZEM CD) 120 MG 24 hr capsule TAKE 1 CAPSULE BY MOUTH EVERY DAY 10/09/21   Cantwell, Celeste C, PA-C  ezetimibe (ZETIA) 10 MG tablet Take 10 mg by  mouth every morning. 06/23/20   [provider]  fluticasone (FLONASE) 50 MCG/ACT nasal spray 1 spray daily as needed for allergies or rhinitis.    [provider]  ibuprofen (ADVIL) 200 MG tablet Take 800 mg by mouth daily as needed (pain).    [provider]  insulin detemir (LEVEMIR) 100 UNIT/ML injection Inject 60 Units into the skin 2 (two) times daily.    [provider]  insulin lispro (HUMALOG) 100 UNIT/ML injection 5-30 Units See admin instructions. Inject 5-30 units subcutaneously up to 5 times daily - sliding scale is based on CBG 01/16/20   [provider]  labetalol (NORMODYNE) 300 MG tablet Take 300 mg by mouth in the morning and at bedtime.    [provider]  LAGEVRIO 200 MG CAPS capsule Take 4 capsules by mouth 2 (two) times daily. 07/20/21   [provider]  lamoTRIgine (LAMICTAL) 200 MG tablet Take 200 mg by mouth 2 (two) times daily. 06/27/20   [provider]  levocetirizine (XYZAL) 5 MG tablet Take 5 mg by mouth at bedtime.    [provider]  losartan (COZAAR) 25 MG tablet TAKE 1 TABLET (25 MG TOTAL) BY MOUTH DAILY. 08/25/21 11/23/21  Patwardhan, Manish J, MD  nitroGLYCERIN (NITROSTAT) 0.4 MG SL tablet Place 1 tablet (0.4 mg total) under the tongue every 5 (five) minutes as needed for chest pain. 07/25/20 09/25/21  Patwardhan, Reynold Bowen, MD  Omega-3 Fatty Acids (FISH OIL OMEGA-3 PO) Take 2 capsules by mouth at bedtime.    [provider]  omeprazole (PRILOSEC) 20 MG capsule Take 20 mg by mouth every morning. 06/23/20   [provider]  Polyvinyl Alcohol-Povidone (REFRESH OP) Place 1 drop into both eyes at bedtime. Preservative free    [provider]  rosuvastatin (CRESTOR) 20 MG tablet Take 1 tablet (20 mg total) by mouth daily. Patient taking differently: Take 20 mg by mouth every morning. 03/30/21 03/25/22  Cantwell, Celeste C, PA-C  sodium chloride (OCEAN) 0.65 % nasal spray Place 2  sprays into the nose at bedtime.    [provider]      Allergies    Dulaglutide, Semaglutide(0.25 or 0.5mg -dos), Meclizine, Prednisone, and Quinolones    Review of Systems   Review of Systems  Musculoskeletal:  Positive for neck pain.  Neurological:  Positive for headaches.  All other systems reviewed and are negative.   Physical Exam Updated Vital Signs BP 140/62   Pulse 67   Temp 97.9 F (36.6 C) (Oral)   Resp 13   Ht 5\' 8"  (1.727 m)   Wt 115.2 kg   SpO2 96%   BMI 38.62 kg/m  Physical Exam Vitals and nursing note reviewed.  Constitutional:      Appearance: Normal appearance.  HENT:     Head: Normocephalic and atraumatic.      Right Ear: External ear normal.     Left Ear: External ear normal.     Mouth/Throat:     Mouth: Mucous membranes are moist.     Pharynx: Oropharynx is clear.  Eyes:     Extraocular Movements: Extraocular movements intact.     Conjunctiva/sclera: Conjunctivae normal.     Pupils: Pupils are equal, round, and reactive to light.  Neck:     Comments: In c-collar Pulmonary:     Effort: Pulmonary effort is normal.     Breath sounds: Normal breath sounds.  Abdominal:     General: Abdomen is flat. Bowel sounds are normal.     Palpations: Abdomen is soft.  Musculoskeletal:        General: Normal range of motion.     Cervical back: Muscular tenderness present.  Skin:    General: Skin is warm.     Capillary Refill: Capillary refill takes less than 2 seconds.  Neurological:     General: No focal deficit present.     Mental Status: She is alert and oriented to person, place, and time.  Psychiatric:        Mood and Affect: Mood normal.        Behavior: Behavior normal.     ED Results / Procedures / Treatments   Labs (all labs ordered are listed, but only abnormal results are displayed) Labs Reviewed  COMPREHENSIVE METABOLIC PANEL - Abnormal; Notable for the following components:      Result Value   Glucose, Bld 130 (*)     Creatinine, Ser 1.30 (*)    Calcium 8.7 (*)    GFR, Estimated 44 (*)    All other components within normal limits  CBG MONITORING, ED - Abnormal; Notable for the following components:   Glucose-Capillary 147 (*)    All other components within normal limits  CBC  WITH DIFFERENTIAL/PLATELET  URINALYSIS, ROUTINE W REFLEX MICROSCOPIC  TROPONIN I (HIGH SENSITIVITY)  TROPONIN I (HIGH SENSITIVITY)    EKG EKG Interpretation  Date/Time:  Friday October 13 2021 08:16:19 EDT Ventricular Rate:  70 PR Interval:  184 QRS Duration: 100 QT Interval:  455 QTC Calculation: 491 R Axis:   -22 Text Interpretation: Sinus rhythm Borderline left axis deviation Borderline prolonged QT interval No old tracing to compare Confirmed by Isla Pence [phone]) on 10/13/2021 8:21:52 AM  Radiology CT HEAD WO CONTRAST  Result Date: 10/13/2021 CLINICAL DATA:  Fall EXAM: CT HEAD WITHOUT CONTRAST CT CERVICAL SPINE WITHOUT CONTRAST TECHNIQUE: Multidetector CT imaging of the head and cervical spine was performed following the standard protocol without intravenous contrast. Multiplanar CT image reconstructions of the cervical spine were also generated. RADIATION DOSE REDUCTION: This exam was performed according to the departmental dose-optimization program which includes automated exposure control, adjustment of the mA and/or kV according to patient size and/or use of iterative reconstruction technique. COMPARISON:  None Available. FINDINGS: CT HEAD FINDINGS Brain: There is no acute intracranial hemorrhage, extra-axial fluid collection, or acute infarct. Parenchymal volume is normal. The ventricles are normal in size. Gray-white differentiation is preserved. There is no mass lesion.  There is no mass effect or midline shift. Vascular: There is calcification of the bilateral cavernous ICAs. Skull: Normal. Negative for fracture or focal lesion. Sinuses/Orbits: The imaged paranasal sinuses are clear. Bilateral lens implants are in  place. The globes and orbits are otherwise unremarkable. Other: There is right frontoparietal scalp swelling. CT CERVICAL SPINE FINDINGS Alignment: Normal. There is no antero or retrolisthesis. There is no jumped or perched facet or other evidence of traumatic malalignment. Skull base and vertebrae: Skull base alignment is maintained. Vertebral body heights are preserved. There is no evidence of acute fracture. Soft tissues and spinal canal: No prevertebral fluid or swelling. No visible canal hematoma. Disc levels: There is disc space narrowing and degenerative endplate change most advanced at C4-C5 at C6-C7. There is overall mild facet arthropathy most advanced on the right at C2-C3. There is no high-grade spinal canal stenosis. Upper chest: The imaged lung apices are clear. Other: None. IMPRESSION: 1. No acute intracranial hemorrhage or calvarial fracture. 2. No acute fracture or traumatic malalignment of the cervical spine. 3. Right frontoparietal scalp swelling without underlying calvarial fracture. Electronically Signed   By: Valetta Mole M.D.   On: 10/13/2021 09:19   CT CERVICAL SPINE WO CONTRAST  Result Date: 10/13/2021 CLINICAL DATA:  Fall EXAM: CT HEAD WITHOUT CONTRAST CT CERVICAL SPINE WITHOUT CONTRAST TECHNIQUE: Multidetector CT imaging of the head and cervical spine was performed following the standard protocol without intravenous contrast. Multiplanar CT image reconstructions of the cervical spine were also generated. RADIATION DOSE REDUCTION: This exam was performed according to the departmental dose-optimization program which includes automated exposure control, adjustment of the mA and/or kV according to patient size and/or use of iterative reconstruction technique. COMPARISON:  None Available. FINDINGS: CT HEAD FINDINGS Brain: There is no acute intracranial hemorrhage, extra-axial fluid collection, or acute infarct. Parenchymal volume is normal. The ventricles are normal in size. Gray-white  differentiation is preserved. There is no mass lesion.  There is no mass effect or midline shift. Vascular: There is calcification of the bilateral cavernous ICAs. Skull: Normal. Negative for fracture or focal lesion. Sinuses/Orbits: The imaged paranasal sinuses are clear. Bilateral lens implants are in place. The globes and orbits are otherwise unremarkable. Other: There is right frontoparietal scalp swelling. CT CERVICAL SPINE  FINDINGS Alignment: Normal. There is no antero or retrolisthesis. There is no jumped or perched facet or other evidence of traumatic malalignment. Skull base and vertebrae: Skull base alignment is maintained. Vertebral body heights are preserved. There is no evidence of acute fracture. Soft tissues and spinal canal: No prevertebral fluid or swelling. No visible canal hematoma. Disc levels: There is disc space narrowing and degenerative endplate change most advanced at C4-C5 at C6-C7. There is overall mild facet arthropathy most advanced on the right at C2-C3. There is no high-grade spinal canal stenosis. Upper chest: The imaged lung apices are clear. Other: None. IMPRESSION: 1. No acute intracranial hemorrhage or calvarial fracture. 2. No acute fracture or traumatic malalignment of the cervical spine. 3. Right frontoparietal scalp swelling without underlying calvarial fracture. Electronically Signed   By: Valetta Mole M.D.   On: 10/13/2021 09:19   DG Chest 1 View  Result Date: 10/13/2021 CLINICAL DATA:  71 year old female status post fall this morning EXAM: CHEST  1 VIEW COMPARISON:  None Available. FINDINGS: No focal consolidation, pleural effusion, or pneumothorax. Normal cardiomediastinal silhouette. No acute osseous abnormality. IMPRESSION: No acute abnormality. Electronically Signed   By: Placido Sou M.D.   On: 10/13/2021 09:04    Procedures Procedures    Medications Ordered in ED Medications  0.9 %  sodium chloride infusion ( Intravenous New Bag/Given 10/13/21 0907)     ED Course/ Medical Decision Making/ A&P                           Medical Decision Making Amount and/or Complexity of Data Reviewed Labs: ordered. Radiology: ordered.  Risk Prescription drug management.   This patient presents to the ED for concern of fall, this involves an extensive number of treatment options, and is a complaint that carries with it a high risk of complications and morbidity.  The differential diagnosis includes syncope, mechanical fall, hypoglycemia, electrolyte abn, infection   Co morbidities that complicate the patient evaluation  DM, HLD, HTN, CKD, CAD, PVD, ambulatory dysfunction (uses a walker) and sleep apnea   Additional history obtained:  Additional history obtained from epic chart review External records from outside source obtained and reviewed including EMS report   Lab Tests:  I Ordered, and personally interpreted labs.  The pertinent results include:  cbc nl, cmp nl, trop nl   Imaging Studies ordered:  I ordered imaging studies including cxr, ct head/c-spine  I independently visualized and interpreted imaging which showed  CT head/C-spine: IMPRESSION:  1. No acute intracranial hemorrhage or calvarial fracture.  2. No acute fracture or traumatic malalignment of the cervical  spine.  3. Right frontoparietal scalp swelling without underlying calvarial  fracture.  CXR: IMPRESSION:  No acute abnormality.   I agree with the radiologist interpretation   Cardiac Monitoring:  The patient was maintained on a cardiac monitor.  I personally viewed and interpreted the cardiac monitored which showed an underlying rhythm of: nsr   Medicines ordered and prescription drug management:  I ordered medication including ivfs  for dehydration  Reevaluation of the patient after these medicines showed that the patient improved I have reviewed the patients home medicines and have made adjustments as needed  Problem List / ED Course:  Fall:   likely related to hypoglycemia.  Pt just saw her endocrinologist about 1.5 weeks ago.  She has an insulin pump and a 24 hr monitor ordered, but there has not been a nurse to train her  on its use yet.   Head contusion:  no intracranial abn.  She is able to ambulate now without any problems.   Reevaluation:  After the interventions noted above, I reevaluated the patient and found that they have :improved   Social Determinants of Health:  Lives alone   Dispostion:  After consideration of the diagnostic results and the patients response to treatment, I feel that the patent would benefit from discharge with outpatient f/u.          Final Clinical Impression(s) / ED Diagnoses Final diagnoses:  Hematoma of right parietal scalp, initial encounter  Fall, initial encounter    Rx / DC Orders ED Discharge Orders     None         Jacalyn Lefevre, MD 10/13/21 1215

## 2021-10-16 ENCOUNTER — Encounter (INDEPENDENT_AMBULATORY_CARE_PROVIDER_SITE_OTHER): Payer: Medicare Other | Admitting: Ophthalmology

## 2021-10-16 ENCOUNTER — Encounter (INDEPENDENT_AMBULATORY_CARE_PROVIDER_SITE_OTHER): Payer: Self-pay

## 2021-10-19 DIAGNOSIS — E1129 Type 2 diabetes mellitus with other diabetic kidney complication: Secondary | ICD-10-CM | POA: Diagnosis not present

## 2021-10-19 DIAGNOSIS — E1159 Type 2 diabetes mellitus with other circulatory complications: Secondary | ICD-10-CM | POA: Diagnosis not present

## 2021-12-21 DIAGNOSIS — G4733 Obstructive sleep apnea (adult) (pediatric): Secondary | ICD-10-CM | POA: Diagnosis not present

## 2022-01-10 DIAGNOSIS — Z794 Long term (current) use of insulin: Secondary | ICD-10-CM | POA: Diagnosis not present

## 2022-01-10 DIAGNOSIS — M9905 Segmental and somatic dysfunction of pelvic region: Secondary | ICD-10-CM | POA: Diagnosis not present

## 2022-01-10 DIAGNOSIS — M9903 Segmental and somatic dysfunction of lumbar region: Secondary | ICD-10-CM | POA: Diagnosis not present

## 2022-01-10 DIAGNOSIS — E113413 Type 2 diabetes mellitus with severe nonproliferative diabetic retinopathy with macular edema, bilateral: Secondary | ICD-10-CM | POA: Diagnosis not present

## 2022-01-10 DIAGNOSIS — Z961 Presence of intraocular lens: Secondary | ICD-10-CM | POA: Diagnosis not present

## 2022-01-10 DIAGNOSIS — M9904 Segmental and somatic dysfunction of sacral region: Secondary | ICD-10-CM | POA: Diagnosis not present

## 2022-01-10 DIAGNOSIS — M5136 Other intervertebral disc degeneration, lumbar region: Secondary | ICD-10-CM | POA: Diagnosis not present

## 2022-02-22 ENCOUNTER — Ambulatory Visit: Payer: Medicare Other | Admitting: Cardiology

## 2022-02-22 ENCOUNTER — Encounter: Payer: Self-pay | Admitting: Cardiology

## 2022-02-22 ENCOUNTER — Other Ambulatory Visit: Payer: Medicare Other

## 2022-02-22 VITALS — BP 162/73 | HR 62 | Resp 16 | Ht 68.0 in | Wt 269.0 lb

## 2022-02-22 DIAGNOSIS — R002 Palpitations: Secondary | ICD-10-CM

## 2022-02-22 DIAGNOSIS — I340 Nonrheumatic mitral (valve) insufficiency: Secondary | ICD-10-CM

## 2022-02-22 DIAGNOSIS — I25118 Atherosclerotic heart disease of native coronary artery with other forms of angina pectoris: Secondary | ICD-10-CM

## 2022-02-22 DIAGNOSIS — R0609 Other forms of dyspnea: Secondary | ICD-10-CM | POA: Diagnosis not present

## 2022-02-22 DIAGNOSIS — I2510 Atherosclerotic heart disease of native coronary artery without angina pectoris: Secondary | ICD-10-CM

## 2022-02-22 NOTE — Progress Notes (Signed)
Patient referred by Suzanne Morning, DO for coronary artery disease  Subjective:   Suzanne Patton, female    DOB: 13-Jan-1951, 71 y.o.   MRN: [id number]   Chief Complaint  Patient presents with   Shortness of Breath   Coronary Artery Disease     HPI  71 y.o. Caucasian female with hypertension, hyperlipidemia, type 2 DM, CAD, SVT, bipolar disorder, OSA on CPAP, morbid obesity  Patient has had worsening dyspnea on exertion. She has good days and bad days. She deies any particular chest pain symptoms. That said, she did not have chest pain even prior to her PCI in 2012. Blood pressure is elevated today. She also wonders if she could be having recurrence of her SVT as she remembers feeling similarly short of breath when she she had her SVT, although denies any particular palpitations symptoms.   Initial consultation HPI 07/2020: Patient has known CAD with PCI in 2012, has h/o SVT with ablations, all performed in [place].   Patient moved to [place] in July 2020 to be with her daughter.  Most recently, she lived in [place], although she has previously lived in several different states.  Her last visit with her cardiologist in [place] was in September 2020.  Patient has had left-sided chest pressure symptoms with physical and emotional stress, relieved with stress, for long time.  This had been stable, but increased this past winter.  Patient has 2-3 episodes a week.  She is not on any short or long-acting nitroglycerin.  Patient reports episodes of lightheadedness followed by syncope, most recent episode 3 weeks ago.  She has several other episodes of aborted syncope.  She denies any chest pain or shortness of breath prior to her syncope episode.  She is reportedly undergone extensive neurological work-up in [place], which was reportedly unyielding.  Her diabetes remains uncontrolled.  She is now established care with Dr. Janie Patton, with whom she is working actively on management of her  diabetes.   Current Outpatient Medications:    Ascorbic Acid (VITAMIN C) 1000 MG tablet, Take 1,000 mg by mouth 2 (two) times daily., Disp: , Rfl:    aspirin 81 MG EC tablet, Take 81 mg by mouth at bedtime., Disp: , Rfl:    CALCIUM CITRATE PO, Take 2 tablets by mouth at bedtime., Disp: , Rfl:    citalopram (CELEXA) 20 MG tablet, Take 20 mg by mouth every Patton., Disp: , Rfl:    clonazePAM (KLONOPIN) 0.5 MG tablet, Take 0.75 mg by mouth at bedtime., Disp: , Rfl:    diclofenac Sodium (VOLTAREN) 1 % GEL, Apply 2 g topically at bedtime as needed (pain)., Disp: , Rfl:    diltiazem (CARDIZEM CD) 120 MG 24 hr capsule, TAKE 1 CAPSULE BY MOUTH EVERY DAY (Patient taking differently: Take 120 mg by mouth daily.), Disp: 90 capsule, Rfl: 1   ezetimibe (ZETIA) 10 MG tablet, Take 10 mg by mouth every Patton., Disp: , Rfl:    fluticasone (FLONASE) 50 MCG/ACT nasal spray, 1 spray daily as needed for allergies or rhinitis., Disp: , Rfl:    GVOKE HYPOPEN 2-PACK 1 MG/0.2ML SOAJ, Take 1 mg by mouth as needed., Disp: , Rfl:    ibuprofen (ADVIL) 200 MG tablet, Take 800 mg by mouth daily as needed for headache, mild pain or moderate pain (pain)., Disp: , Rfl:    insulin detemir (LEVEMIR) 100 UNIT/ML injection, Inject 60 Units into the skin 2 (two) times daily., Disp: , Rfl:    insulin  lispro (HUMALOG) 100 UNIT/ML injection, Inject 10-20 Units into the skin See admin instructions. Inject 10-20 units subcutaneously up to 5 times daily - sliding scale is based on CBG, Disp: , Rfl:    labetalol (NORMODYNE) 300 MG tablet, Take 300 mg by mouth in the Patton and at bedtime., Disp: , Rfl:    lamoTRIgine (LAMICTAL) 200 MG tablet, Take 200 mg by mouth 2 (two) times daily., Disp: , Rfl:    levocetirizine (XYZAL) 5 MG tablet, Take 5 mg by mouth at bedtime., Disp: , Rfl:    losartan (COZAAR) 25 MG tablet, TAKE 1 TABLET (25 MG TOTAL) BY MOUTH DAILY., Disp: 90 tablet, Rfl: 1   nitroGLYCERIN (NITROSTAT) 0.4 MG SL tablet, Place 1  tablet (0.4 mg total) under the tongue every 5 (five) minutes as needed for chest pain., Disp: 30 tablet, Rfl: 3   Omega-3 Fatty Acids (FISH OIL OMEGA-3 PO), Take 2 capsules by mouth at bedtime., Disp: , Rfl:    omeprazole (PRILOSEC) 20 MG capsule, Take 20 mg by mouth every Patton., Disp: , Rfl:    Polyvinyl Alcohol-Povidone (REFRESH OP), Place 1 drop into both eyes at bedtime. Preservative free, Disp: , Rfl:    sodium chloride (OCEAN) 0.65 % nasal spray, Place 2 sprays into the nose at bedtime., Disp: , Rfl:    albuterol (VENTOLIN HFA) 108 (90 Base) MCG/ACT inhaler, 1-2 puffs 4 (four) times daily as needed for wheezing or shortness of breath. (Patient not taking: Reported on 02/22/2022), Disp: , Rfl:   Cardiovascular and other pertinent studies:  EKG 09/25/2021: Sinus rhythm 66 bpm Poor R-wave progression  Lexiscan Tetrofosmin stress test 09/05/2020: Lexiscan nuclear stress test performed using 1-day protocol. Patient reported 2/10 chest pain, dyspnea, and dizziness. SPECT images show small sized, mild intensity, reversible perfusion defect in basal anteroseptal myocardium. Stress LVEF is calculated 49%, but visually appears normal. Low risk study.  Echocardiogram 09/05/2020:  Left ventricle cavity is normal in size. Moderate concentric hypertrophy  of the left ventricle. Normal global wall motion. Normal LV systolic  function with EF 60%. Normal diastolic filling pattern.  Structurally normal trileaflet aortic valve. No evidence of aortic  stenosis. Moderate (Grade II) aortic regurgitation.  Moderate (Grade II) mitral regurgitation.  Mild tricuspid regurgitation. Estimated pulmonary artery systolic pressure  26 mmHg.  Mild pulmonic regurgitation.  Mobile cardiac telemetry 13 days 07/25/2020 - 08/08/2020: Dominant rhythm: Sinus. HR 58-150 bpm. Avg HR 71 bpm. 37 episodes of SVT, fastest at 126 bpm for 9 beats, longest for 12 beats at 105 bpm. <1% isolated SVE, couplet/triplets. 1 episode  of VT, at 150 bpm for 4 beats <1% isolated VE, no couplet/triplets. No atrial fibrillation/atrial flutter/high grade AV block, sinus pause >3sec noted. 34 patient triggered events, correlated with sinus rhythm without any arrhthymias.  EKG 07/25/2020: Sinus rhythm 66 bpm Poor R-wave progression, otherwise normal EKG   Recent labs: 10/13/2021: Glucose 130, BUN/Cr 22/1.30. EGFR 44. Na/K 140/4.4. Rest of the CMP normal H/H 12/38. MCV 88. Platelets 214 Trop HS 6,7  05/30/2021: Glucose 194, BUN/Cr 20/1.52. EGFR 39. Na/K 141/4.9. Rest of the CMP normal H/H 12/37. MCV 88. Platelets 236 HbA1C 7.2% Chol 158, TG 117, HDL 71, LDL 64  06/17/2020: Glucose 259, BUN/Cr 19/1.3. EGFR 44. Na/K 141/5.2. AlKP 132. Rest of the CMP normal H/H 13/41. MCV 87. Platelets 250 HbA1C 8.0% Chol 268, TG 181, HDL 81, LDL 155 TSH 1.5 normal    Review of Systems  Cardiovascular:  Positive for dyspnea on exertion. Negative for  chest pain, leg swelling, palpitations and syncope (No recurrence).  Neurological:  Negative for light-headedness.         Vitals:   02/22/22 1515 02/22/22 1522  BP: (!) 178/81 (!) 162/73  Pulse: 68 62  Resp: 16   SpO2: 93%      Body mass index is 40.9 kg/m. Filed Weights   02/22/22 1515  Weight: 269 lb (122 kg)     Objective:   Physical Exam Vitals and nursing note reviewed.  Constitutional:      General: She is not in acute distress. Neck:     Vascular: No JVD.  Cardiovascular:     Rate and Rhythm: Normal rate and regular rhythm.     Heart sounds: Normal heart sounds. No murmur heard. Pulmonary:     Effort: Pulmonary effort is normal.     Breath sounds: Normal breath sounds. No wheezing or rales.  Musculoskeletal:     Right lower leg: No edema.     Left lower leg: No edema.  Neurological:     Mental Status: She is alert.         Assessment & Recommendations:   71 y.o. Caucasian female with hypertension, hyperlipidemia, type 2 DM, CAD, SVT, bipolar  disorder, OSA on CPAP, morbid obesity  Exertional dyspnea: Likely multifactorial in the setting of recent COVID, obesity, deconditioning, as well as suspicion for angina equivalent. No ischemia on stress testing (08/2020), but symptoms of dyspnea are worsening.  Recommend 1 week monitor and echocardiogram. Increase diltiazem to 240 mg daily. Continue Aspirin 81 mg, Crestor 20 mg, labetalol 300 mg bid, SL NTG prn If symptoms do not improve and no clear etiology found, will consider coronayr angiography to  rule out obstructive CAD.  Hypertension: Well-controlled  Mixed hyperlipidemia: Well-controlled  F/u in 3-4 weeks    Nigel Mormon, MD Pager: [phone] Office: [phone]

## 2022-02-24 ENCOUNTER — Encounter: Payer: Self-pay | Admitting: Cardiology

## 2022-02-24 DIAGNOSIS — I340 Nonrheumatic mitral (valve) insufficiency: Secondary | ICD-10-CM | POA: Insufficient documentation

## 2022-02-24 DIAGNOSIS — R002 Palpitations: Secondary | ICD-10-CM | POA: Insufficient documentation

## 2022-03-05 DIAGNOSIS — Z79899 Other long term (current) drug therapy: Secondary | ICD-10-CM | POA: Diagnosis not present

## 2022-03-05 DIAGNOSIS — R946 Abnormal results of thyroid function studies: Secondary | ICD-10-CM | POA: Diagnosis not present

## 2022-03-05 DIAGNOSIS — N1832 Chronic kidney disease, stage 3b: Secondary | ICD-10-CM | POA: Diagnosis not present

## 2022-03-05 DIAGNOSIS — E1159 Type 2 diabetes mellitus with other circulatory complications: Secondary | ICD-10-CM | POA: Diagnosis not present

## 2022-03-05 DIAGNOSIS — I2510 Atherosclerotic heart disease of native coronary artery without angina pectoris: Secondary | ICD-10-CM | POA: Diagnosis not present

## 2022-03-06 DIAGNOSIS — I2510 Atherosclerotic heart disease of native coronary artery without angina pectoris: Secondary | ICD-10-CM | POA: Diagnosis not present

## 2022-03-06 DIAGNOSIS — Z0000 Encounter for general adult medical examination without abnormal findings: Secondary | ICD-10-CM | POA: Diagnosis not present

## 2022-03-06 DIAGNOSIS — G4733 Obstructive sleep apnea (adult) (pediatric): Secondary | ICD-10-CM | POA: Diagnosis not present

## 2022-03-06 LAB — LIPID PANEL
Chol/HDL Ratio: 2.2 ratio (ref 0.0–4.4)
Cholesterol, Total: 163 mg/dL (ref 100–199)
HDL: 75 mg/dL (ref >39)
LDL Chol Calc (NIH): 59 mg/dL (ref 0–99)
Triglycerides: 182 mg/dL — ABNORMAL HIGH (ref 0–149)
VLDL Cholesterol Cal: 29 mg/dL (ref 5–40)

## 2022-03-06 LAB — CBC
Hematocrit: 37.4 % (ref 34.0–46.6)
Hemoglobin: 12.2 g/dL (ref 11.1–15.9)
MCH: 27.9 pg (ref 26.6–33.0)
MCHC: 32.6 g/dL (ref 31.5–35.7)
MCV: 86 fL (ref 79–97)
Platelets: 239 10*3/uL (ref 150–450)
RBC: 4.37 x10E6/uL (ref 3.77–5.28)
RDW: 13.2 % (ref 11.7–15.4)
WBC: 8.3 10*3/uL (ref 3.4–10.8)

## 2022-03-06 LAB — BASIC METABOLIC PANEL
BUN/Creatinine Ratio: 14 (ref 12–28)
BUN: 23 mg/dL (ref 8–27)
CO2: 24 mmol/L (ref 20–29)
Calcium: 9.4 mg/dL (ref 8.7–10.3)
Chloride: 98 mmol/L (ref 96–106)
Creatinine, Ser: 1.68 mg/dL — ABNORMAL HIGH (ref 0.57–1.00)
Glucose: 162 mg/dL — ABNORMAL HIGH (ref 70–99)
Potassium: 4.9 mmol/L (ref 3.5–5.2)
Sodium: 137 mmol/L (ref 134–144)
eGFR: 32 mL/min/{1.73_m2} — ABNORMAL LOW (ref >59)

## 2022-03-06 LAB — HEMOGLOBIN A1C
Est. average glucose Bld gHb Est-mCnc: 203 mg/dL
Hgb A1c MFr Bld: 8.7 % — ABNORMAL HIGH (ref 4.8–5.6)

## 2022-03-09 DIAGNOSIS — R002 Palpitations: Secondary | ICD-10-CM | POA: Diagnosis not present

## 2022-03-14 ENCOUNTER — Ambulatory Visit: Payer: Medicare Other | Admitting: Internal Medicine

## 2022-03-14 ENCOUNTER — Encounter: Payer: Self-pay | Admitting: Internal Medicine

## 2022-03-14 ENCOUNTER — Ambulatory Visit: Payer: Medicare Other

## 2022-03-14 VITALS — BP 135/62 | HR 64 | Ht 68.0 in | Wt 267.0 lb

## 2022-03-14 DIAGNOSIS — I340 Nonrheumatic mitral (valve) insufficiency: Secondary | ICD-10-CM

## 2022-03-14 DIAGNOSIS — I200 Unstable angina: Secondary | ICD-10-CM

## 2022-03-14 DIAGNOSIS — I10 Essential (primary) hypertension: Secondary | ICD-10-CM

## 2022-03-14 DIAGNOSIS — R0602 Shortness of breath: Secondary | ICD-10-CM

## 2022-03-15 LAB — BASIC METABOLIC PANEL
BUN/Creatinine Ratio: 16 (ref 12–28)
BUN: 28 mg/dL — ABNORMAL HIGH (ref 8–27)
CO2: 22 mmol/L (ref 20–29)
Calcium: 9.4 mg/dL (ref 8.7–10.3)
Chloride: 98 mmol/L (ref 96–106)
Creatinine, Ser: 1.73 mg/dL — ABNORMAL HIGH (ref 0.57–1.00)
Glucose: 256 mg/dL — ABNORMAL HIGH (ref 70–99)
Potassium: 5.2 mmol/L (ref 3.5–5.2)
Sodium: 136 mmol/L (ref 134–144)
eGFR: 31 mL/min/{1.73_m2} — ABNORMAL LOW (ref >59)

## 2022-03-15 LAB — CBC
Hematocrit: 37.7 % (ref 34.0–46.6)
Hemoglobin: 11.9 g/dL (ref 11.1–15.9)
MCH: 27.8 pg (ref 26.6–33.0)
MCHC: 31.6 g/dL (ref 31.5–35.7)
MCV: 88 fL (ref 79–97)
Platelets: 276 10*3/uL (ref 150–450)
RBC: 4.28 x10E6/uL (ref 3.77–5.28)
RDW: 13.3 % (ref 11.7–15.4)
WBC: 7.8 10*3/uL (ref 3.4–10.8)

## 2022-03-15 NOTE — Progress Notes (Signed)
Patient referred by [name], DO for coronary artery disease  Subjective:   Suzanne Patton, female    DOB: April 05, 1950, 71 y.o.   MRN: [id number]   Chief Complaint  Patient presents with   Shortness of Breath     Shortness of Breath Pertinent negatives include no chest pain, leg swelling or syncope (No recurrence).    71 y.o. Caucasian female with hypertension, hyperlipidemia, type 2 DM, CAD, SVT, bipolar disorder, OSA on CPAP, morbid obesity  Patient has had worsening dyspnea on exertion. She has been getting worse and had to stop multiple times when walking from the waiting room to the echo room. Patient states this is the same as when she had a stent placed over 12 years ago. Despite medication changes and close follow-up, patient continues to have progressive dyspnea on exertion.      Current Outpatient Medications:    albuterol (VENTOLIN HFA) 108 (90 Base) MCG/ACT inhaler, Inhale 2 puffs into the lungs at bedtime as needed for wheezing or shortness of breath., Disp: , Rfl:    aspirin 81 MG EC tablet, Take 81 mg by mouth at bedtime., Disp: , Rfl:    CALCIUM CITRATE PO, Take 4 tablets by mouth at bedtime., Disp: , Rfl:    citalopram (CELEXA) 40 MG tablet, Take 40 mg by mouth at bedtime., Disp: , Rfl:    clonazePAM (KLONOPIN) 0.5 MG tablet, Take 1 mg by mouth at bedtime., Disp: , Rfl:    diclofenac Sodium (VOLTAREN) 1 % GEL, Apply 2 g topically daily as needed (pain)., Disp: , Rfl:    diltiazem (CARDIZEM CD) 120 MG 24 hr capsule, TAKE 1 CAPSULE BY MOUTH EVERY DAY (Patient taking differently: Take 240 mg by mouth daily.), Disp: 90 capsule, Rfl: 1   ezetimibe (ZETIA) 10 MG tablet, Take 10 mg by mouth every morning., Disp: , Rfl:    GVOKE HYPOPEN 2-PACK 1 MG/0.2ML SOAJ, Take 1 mg by mouth as needed (Low blood glucose)., Disp: , Rfl:    HYDROcodone-acetaminophen (NORCO) 7.5-325 MG tablet, Take 1 tablet by mouth every 6 (six) hours as needed for severe pain., Disp: , Rfl:     ibuprofen (ADVIL) 200 MG tablet, Take 800 mg by mouth daily as needed for headache, mild pain or moderate pain (pain)., Disp: , Rfl:    insulin detemir (LEVEMIR) 100 UNIT/ML injection, Inject 60 Units into the skin 2 (two) times daily., Disp: , Rfl:    insulin lispro (HUMALOG) 100 UNIT/ML injection, Inject 10-20 Units into the skin See admin instructions. Inject 10-20 units subcutaneously up to 3 times daily - sliding scale is based on CBG, Disp: , Rfl:    labetalol (NORMODYNE) 300 MG tablet, Take 300 mg by mouth in the morning and at bedtime., Disp: , Rfl:    lamoTRIgine (LAMICTAL) 200 MG tablet, Take 200 mg by mouth 2 (two) times daily., Disp: , Rfl:    levocetirizine (XYZAL) 5 MG tablet, Take 5 mg by mouth at bedtime., Disp: , Rfl:    losartan (COZAAR) 25 MG tablet, TAKE 1 TABLET (25 MG TOTAL) BY MOUTH DAILY. (Patient taking differently: Take 25 mg by mouth every morning.), Disp: 90 tablet, Rfl: 1   nitroGLYCERIN (NITROSTAT) 0.4 MG SL tablet, Place 1 tablet (0.4 mg total) under the tongue every 5 (five) minutes as needed for chest pain., Disp: 30 tablet, Rfl: 3   Olopatadine HCl (PATADAY OP), Place 1 drop into both eyes at bedtime. Extra strength, Disp: , Rfl:  Omega-3 Fatty Acids (FISH OIL OMEGA-3) 1000 MG CAPS, Take 1,000 mg by mouth at bedtime., Disp: , Rfl:    omeprazole (PRILOSEC) 20 MG capsule, Take 20 mg by mouth every morning., Disp: , Rfl:    sodium chloride (OCEAN) 0.65 % nasal spray, Place 2 sprays into the nose at bedtime. Ayr, Disp: , Rfl:   Cardiovascular and other pertinent studies:  EKG 09/25/2021: Sinus rhythm 66 bpm Poor R-wave progression  Lexiscan Tetrofosmin stress test 09/05/2020: Lexiscan nuclear stress test performed using 1-day protocol. Patient reported 2/10 chest pain, dyspnea, and dizziness. SPECT images show small sized, mild intensity, reversible perfusion defect in basal anteroseptal myocardium. Stress LVEF is calculated 49%, but visually appears normal. Low  risk study.  Echocardiogram 09/05/2020:  Left ventricle cavity is normal in size. Moderate concentric hypertrophy  of the left ventricle. Normal global wall motion. Normal LV systolic  function with EF 60%. Normal diastolic filling pattern.  Structurally normal trileaflet aortic valve. No evidence of aortic  stenosis. Moderate (Grade II) aortic regurgitation.  Moderate (Grade II) mitral regurgitation.  Mild tricuspid regurgitation. Estimated pulmonary artery systolic pressure  26 mmHg.  Mild pulmonic regurgitation.  Mobile cardiac telemetry 13 days 07/25/2020 - 08/08/2020: Dominant rhythm: Sinus. HR 58-150 bpm. Avg HR 71 bpm. 37 episodes of SVT, fastest at 126 bpm for 9 beats, longest for 12 beats at 105 bpm. <1% isolated SVE, couplet/triplets. 1 episode of VT, at 150 bpm for 4 beats <1% isolated VE, no couplet/triplets. No atrial fibrillation/atrial flutter/high grade AV block, sinus pause >3sec noted. 34 patient triggered events, correlated with sinus rhythm without any arrhthymias.  EKG 07/25/2020: Sinus rhythm 66 bpm Poor R-wave progression, otherwise normal EKG  EKG 03/14/2022: unchanged from prior   Recent labs: 10/13/2021: Glucose 130, BUN/Cr 22/1.30. EGFR 44. Na/K 140/4.4. Rest of the CMP normal H/H 12/38. MCV 88. Platelets 214 Trop HS 6,7  05/30/2021: Glucose 194, BUN/Cr 20/1.52. EGFR 39. Na/K 141/4.9. Rest of the CMP normal H/H 12/37. MCV 88. Platelets 236 HbA1C 7.2% Chol 158, TG 117, HDL 71, LDL 64  06/17/2020: Glucose 259, BUN/Cr 19/1.3. EGFR 44. Na/K 141/5.2. AlKP 132. Rest of the CMP normal H/H 13/41. MCV 87. Platelets 250 HbA1C 8.0% Chol 268, TG 181, HDL 81, LDL 155 TSH 1.5 normal    Review of Systems  Cardiovascular:  Positive for dyspnea on exertion. Negative for chest pain, leg swelling, palpitations and syncope (No recurrence).  Respiratory:  Positive for shortness of breath.   Neurological:  Negative for light-headedness.         Vitals:    03/14/22 1433  BP: 135/62  Pulse: 64  SpO2: 92%     Body mass index is 40.6 kg/m. Filed Weights   03/14/22 1433  Weight: 267 lb (121.1 kg)     Objective:   Physical Exam Vitals and nursing note reviewed.  Constitutional:      General: She is not in acute distress. Neck:     Vascular: No JVD.  Cardiovascular:     Rate and Rhythm: Normal rate and regular rhythm.     Heart sounds: Normal heart sounds. No murmur heard. Pulmonary:     Effort: Pulmonary effort is normal.     Breath sounds: Normal breath sounds. No wheezing or rales.  Musculoskeletal:     Right lower leg: No edema.     Left lower leg: No edema.  Neurological:     Mental Status: She is alert.         Assessment &  Recommendations:   71 y.o. Caucasian female with hypertension, hyperlipidemia, type 2 DM, CAD, SVT, bipolar disorder, OSA on CPAP, morbid obesity  Exertional dyspnea: Likely multifactorial in the setting of recent COVID, obesity, deconditioning, as well as suspicion for angina equivalent. No ischemia on stress testing (08/2020), but symptoms of dyspnea are worsening.  Increased diltiazem to 240 mg daily. Continue Aspirin 81 mg, Crestor 20 mg, labetalol 300 mg bid, SL NTG prn Schedule for cardiac catheterization, and possible angioplasty. We discussed regarding risks, benefits, alternatives to this including stress testing, CTA and continued medical therapy. Patient wants to proceed. Understands <1-2% risk of death, stroke, MI, urgent CABG, bleeding, infection, renal failure but not limited to these. Patient instructed not to do heavy lifting, heavy exertional activity, swimming until evaluation is complete.  Patient instructed to call if symptoms worse or to go to the ED for further evaluation.   Hypertension: Well-controlled  Mixed hyperlipidemia: Well-controlled  F/u in 3-4 weeks    Floydene Flock, [place], [facility] Pager: [phone] Office: [phone]

## 2022-03-19 ENCOUNTER — Other Ambulatory Visit: Payer: Self-pay | Admitting: Cardiology

## 2022-03-20 ENCOUNTER — Other Ambulatory Visit: Payer: Self-pay

## 2022-03-20 ENCOUNTER — Encounter (HOSPITAL_COMMUNITY)
Admission: RE | Disposition: A | Discharge status: Home / Self Care | Payer: Self-pay | Source: Ambulatory Visit | Attending: Cardiology

## 2022-03-20 ENCOUNTER — Ambulatory Visit (HOSPITAL_COMMUNITY)
Admission: RE | Admit: 2022-03-20 | Discharge: 2022-03-20 | Disposition: A | Discharge status: Home / Self Care | Payer: Medicare Other | Source: Ambulatory Visit | Attending: Cardiology | Admitting: Cardiology

## 2022-03-20 DIAGNOSIS — Z955 Presence of coronary angioplasty implant and graft: Secondary | ICD-10-CM | POA: Insufficient documentation

## 2022-03-20 DIAGNOSIS — I471 Supraventricular tachycardia, unspecified: Secondary | ICD-10-CM | POA: Diagnosis not present

## 2022-03-20 DIAGNOSIS — R0609 Other forms of dyspnea: Secondary | ICD-10-CM | POA: Insufficient documentation

## 2022-03-20 DIAGNOSIS — G4733 Obstructive sleep apnea (adult) (pediatric): Secondary | ICD-10-CM | POA: Insufficient documentation

## 2022-03-20 DIAGNOSIS — Z7982 Long term (current) use of aspirin: Secondary | ICD-10-CM | POA: Diagnosis not present

## 2022-03-20 DIAGNOSIS — I2510 Atherosclerotic heart disease of native coronary artery without angina pectoris: Secondary | ICD-10-CM | POA: Diagnosis not present

## 2022-03-20 DIAGNOSIS — Z6839 Body mass index (BMI) 39.0-39.9, adult: Secondary | ICD-10-CM | POA: Insufficient documentation

## 2022-03-20 DIAGNOSIS — I25110 Atherosclerotic heart disease of native coronary artery with unstable angina pectoris: Secondary | ICD-10-CM | POA: Insufficient documentation

## 2022-03-20 DIAGNOSIS — Z794 Long term (current) use of insulin: Secondary | ICD-10-CM | POA: Insufficient documentation

## 2022-03-20 DIAGNOSIS — Z79899 Other long term (current) drug therapy: Secondary | ICD-10-CM | POA: Insufficient documentation

## 2022-03-20 DIAGNOSIS — I10 Essential (primary) hypertension: Secondary | ICD-10-CM | POA: Diagnosis not present

## 2022-03-20 DIAGNOSIS — E119 Type 2 diabetes mellitus without complications: Secondary | ICD-10-CM | POA: Diagnosis not present

## 2022-03-20 DIAGNOSIS — F319 Bipolar disorder, unspecified: Secondary | ICD-10-CM | POA: Insufficient documentation

## 2022-03-20 DIAGNOSIS — E782 Mixed hyperlipidemia: Secondary | ICD-10-CM | POA: Diagnosis not present

## 2022-03-20 DIAGNOSIS — I25118 Atherosclerotic heart disease of native coronary artery with other forms of angina pectoris: Secondary | ICD-10-CM | POA: Diagnosis not present

## 2022-03-20 HISTORY — PX: LEFT HEART CATH AND CORONARY ANGIOGRAPHY: CATH118249

## 2022-03-20 HISTORY — PX: INTRAVASCULAR PRESSURE WIRE/FFR STUDY: CATH118243

## 2022-03-20 LAB — GLUCOSE, CAPILLARY
Glucose-Capillary: 353 mg/dL — ABNORMAL HIGH (ref 70–99)
Glucose-Capillary: 329 mg/dL — ABNORMAL HIGH (ref 70–99)
Glucose-Capillary: 311 mg/dL — ABNORMAL HIGH (ref 70–99)

## 2022-03-20 LAB — NO BLOOD PRODUCTS

## 2022-03-20 SURGERY — LEFT HEART CATH AND CORONARY ANGIOGRAPHY
Anesthesia: LOCAL

## 2022-03-20 MED ORDER — FENTANYL CITRATE (PF) 100 MCG/2ML IJ SOLN
INTRAMUSCULAR | Status: DC | PRN
  Administered 2022-03-20: 50 ug via INTRAVENOUS

## 2022-03-20 MED ORDER — HEPARIN SODIUM (PORCINE) 1000 UNIT/ML IJ SOLN
INTRAMUSCULAR | Status: DC | PRN
  Administered 2022-03-20 (×2): 6000 [IU] via INTRAVENOUS

## 2022-03-20 MED ORDER — VERAPAMIL HCL 2.5 MG/ML IV SOLN
INTRAVENOUS | Status: AC
  Filled 2022-03-20: qty 2

## 2022-03-20 MED ORDER — SODIUM CHLORIDE 0.9% FLUSH: 3.0000 mL | Freq: Two times a day (BID) | INTRAVENOUS | Status: DC

## 2022-03-20 MED ORDER — ASPIRIN 81 MG PO CHEW
81.0000 mg | CHEWABLE_TABLET | ORAL | Status: AC
  Administered 2022-03-20: 81 mg via ORAL
  Filled 2022-03-20: qty 1

## 2022-03-20 MED ORDER — ACETAMINOPHEN 325 MG PO TABS: 650.0000 mg | ORAL_TABLET | ORAL | Status: DC | PRN

## 2022-03-20 MED ORDER — SODIUM CHLORIDE 0.9 % IV SOLN: 250.0000 mL | INTRAVENOUS | Status: DC | PRN

## 2022-03-20 MED ORDER — LIDOCAINE HCL (PF) 1 % IJ SOLN
INTRAMUSCULAR | Status: DC | PRN
  Administered 2022-03-20: 2 mL

## 2022-03-20 MED ORDER — MIDAZOLAM HCL 2 MG/2ML IJ SOLN
INTRAMUSCULAR | Status: DC | PRN
  Administered 2022-03-20: 1 mg via INTRAVENOUS

## 2022-03-20 MED ORDER — HEPARIN (PORCINE) IN NACL 1000-0.9 UT/500ML-% IV SOLN
INTRAVENOUS | Status: DC | PRN
  Administered 2022-03-20 (×2): 500 mL

## 2022-03-20 MED ORDER — NITROGLYCERIN 1 MG/10 ML FOR IR/CATH LAB
INTRA_ARTERIAL | Status: DC | PRN
  Administered 2022-03-20: 200 ug via INTRACORONARY

## 2022-03-20 MED ORDER — HYDRALAZINE HCL 20 MG/ML IJ SOLN: 10.0000 mg | INTRAMUSCULAR | Status: DC | PRN

## 2022-03-20 MED ORDER — HEPARIN SODIUM (PORCINE) 1000 UNIT/ML IJ SOLN
INTRAMUSCULAR | Status: AC
  Filled 2022-03-20: qty 10

## 2022-03-20 MED ORDER — ONDANSETRON HCL 4 MG/2ML IJ SOLN: 4.0000 mg | Freq: Four times a day (QID) | INTRAMUSCULAR | Status: DC | PRN

## 2022-03-20 MED ORDER — LIDOCAINE HCL (PF) 1 % IJ SOLN
INTRAMUSCULAR | Status: AC
  Filled 2022-03-20: qty 30

## 2022-03-20 MED ORDER — SODIUM CHLORIDE 0.9% FLUSH: 3.0000 mL | INTRAVENOUS | Status: DC | PRN

## 2022-03-20 MED ORDER — IOHEXOL 350 MG/ML SOLN
INTRAVENOUS | Status: DC | PRN
  Administered 2022-03-20: 80 mL

## 2022-03-20 MED ORDER — MIDAZOLAM HCL 2 MG/2ML IJ SOLN
INTRAMUSCULAR | Status: AC
  Filled 2022-03-20: qty 2

## 2022-03-20 MED ORDER — ASPIRIN 81 MG PO CHEW: 81.0000 mg | CHEWABLE_TABLET | ORAL | Status: DC

## 2022-03-20 MED ORDER — NITROGLYCERIN 1 MG/10 ML FOR IR/CATH LAB
INTRA_ARTERIAL | Status: AC
  Filled 2022-03-20: qty 10

## 2022-03-20 MED ORDER — HEPARIN (PORCINE) IN NACL 1000-0.9 UT/500ML-% IV SOLN
INTRAVENOUS | Status: AC
  Filled 2022-03-20: qty 1000

## 2022-03-20 MED ORDER — SODIUM CHLORIDE 0.9 % IV SOLN: INTRAVENOUS | Status: DC

## 2022-03-20 MED ORDER — SODIUM CHLORIDE 0.9 % WEIGHT BASED INFUSION
3.0000 mL/kg/h | INTRAVENOUS | Status: AC
  Administered 2022-03-20: 3 mL/kg/h via INTRAVENOUS

## 2022-03-20 MED ORDER — FENTANYL CITRATE (PF) 100 MCG/2ML IJ SOLN
INTRAMUSCULAR | Status: AC
  Filled 2022-03-20: qty 2

## 2022-03-20 MED ORDER — VERAPAMIL HCL 2.5 MG/ML IV SOLN
INTRAVENOUS | Status: DC | PRN
  Administered 2022-03-20: 10 mL via INTRA_ARTERIAL

## 2022-03-20 MED ORDER — SODIUM CHLORIDE 0.9 % WEIGHT BASED INFUSION: 1.0000 mL/kg/h | INTRAVENOUS | Status: DC

## 2022-03-20 MED ORDER — INSULIN ASPART 100 UNIT/ML IJ SOLN
3.0000 [IU] | Freq: Once | INTRAMUSCULAR | Status: AC
  Administered 2022-03-20: 3 [IU] via SUBCUTANEOUS
  Filled 2022-03-20: qty 1

## 2022-03-20 MED ORDER — LABETALOL HCL 5 MG/ML IV SOLN: 10.0000 mg | INTRAVENOUS | Status: DC | PRN

## 2022-03-20 SURGICAL SUPPLY — 16 items
CATH 5FR JL3.5 JR4 ANG PIG MP (CATHETERS) IMPLANT
CATH INFINITI 5FR JL4 (CATHETERS) IMPLANT
CATH LAUNCHER 6FR EBU3.5 (CATHETERS) IMPLANT
DEVICE RAD COMP TR BAND LRG (VASCULAR PRODUCTS) IMPLANT
GLIDESHEATH SLEND A-KIT 6F 22G (SHEATH) IMPLANT
GUIDEWIRE INQWIRE 1.5J.035X260 (WIRE) IMPLANT
GUIDEWIRE PRESSURE X 175 (WIRE) IMPLANT
INQWIRE 1.5J .035X260CM (WIRE) ×1 IMPLANT
KIT HEART LEFT (KITS) ×1 IMPLANT
MAT PREVALON FULL STRYKER (MISCELLANEOUS) IMPLANT
PACK CARDIAC CATHETERIZATION (CUSTOM PROCEDURE TRAY) ×1 IMPLANT
SHEATH PROBE COVER 6X72 (BAG) IMPLANT
SYR MEDRAD MARK 7 150ML (SYRINGE) ×1 IMPLANT
TRANSDUCER W/STOPCOCK (MISCELLANEOUS) ×1 IMPLANT
TUBING CIL FLEX 10 FLL-RA (TUBING) ×1 IMPLANT
VALVE GUARDIAN II ~~LOC~~ HEMO (MISCELLANEOUS) IMPLANT

## 2022-03-20 NOTE — Interval H&P Note (Signed)
History and Physical Interval Note:  03/20/2022 7:47 AM  Suzanne Patton  has presented today for surgery, with the diagnosis of shortness of breath, unstable angina.  The various methods of treatment have been discussed with the patient and family. After consideration of risks, benefits and other options for treatment, the patient has consented to  Procedure(s): LEFT HEART CATH AND CORONARY ANGIOGRAPHY (N/A) as a surgical intervention.  The patient's history has been reviewed, patient examined, no change in status, stable for surgery.  I have reviewed the patient's chart and labs.  Questions were answered to the patient's satisfaction.    2016/2017 Appropriate Use Criteria for Coronary Revascularization Symptom Status: Ischemic Symptoms  Non-invasive Testing: Low risk  If no or indeterminate stress test, FFR/iFR results in all diseased vessels: N/A  Diabetes Mellitus: Yes  S/P CABG: No  Antianginal therapy (number of long-acting drugs): >=2  Patient undergoing renal transplant: No  Patient undergoing percutaneous valve procedure: No  1 Vessel Disease PCI CABG  No proximal LAD involvement, No proximal left dominant LCX involvement A (7); Indication 1 M (5); Indication 1  Proximal left dominant LCX involvement A (7); Indication 4 A (7); Indication 4  Proximal LAD involvement A (7); Indication 4 A (7); Indication 4  2 Vessel Disease  No proximal LAD involvement A (7); Indication 7 M (6); Indication 7  Proximal LAD involvement A (7); Indication 13 A (8); Indication 13  3 Vessel Disease  Low disease complexity (e.g., focal stenoses, SYNTAX <=22) A (7); Indication 18 A (8); Indication 18  Intermediate or high disease complexity (e.g., SYNTAX >=23) M (6); Indication 22 A (9); Indication 22  Left Main Disease  Isolated LMCA disease: ostial or midshaft A (7); Indication 24 A (9); Indication 24  Isolated LMCA disease: bifurcation involvement M (6); Indication 25 A (9); Indication 25  LMCA ostial or  midshaft, concurrent low disease burden multivessel disease (e.g., 1-2 additional focal stenoses, SYNTAX <=22) A (7); Indication 26 A (9); Indication 26  LMCA ostial or midshaft, concurrent intermediate or high disease burden multivessel disease (e.g., 1-2 additional bifurcation stenoses, long stenoses, SYNTAX >=23) M (4); Indication 27 A (9); Indication 27  LMCA bifurcation involvement, concurrent low disease burden multivessel disease (e.g., 1-2 additional focal stenoses, SYNTAX <=22) M (6); Indication 28 A (9); Indication 28  LMCA bifurcation involvement, concurrent intermediate or high disease burden multivessel disease (e.g., 1-2 additional bifurcation stenoses, long stenoses, SYNTAX >=23) R (3); Indication 29 A (9); Indication [place]

## 2022-03-20 NOTE — CV Procedure (Incomplete)
Mod CAD RFR 0.93 Medical management   Nigel Mormon, MD Pager: [phone] Office: [phone]

## 2022-03-20 NOTE — Discharge Instructions (Signed)

## 2022-03-20 NOTE — H&P (Signed)
OV 03/14/2022 copied for documentation     Patient referred by [name], DO for coronary artery disease  Subjective:   Suzanne Patton, female    DOB: [birth year], 72 y.o.   MRN: [id number]   Chief Complaint  Patient presents with   Shortness of Breath     Shortness of Breath Pertinent negatives include no chest pain, leg swelling or syncope (No recurrence).    72 y.o. Caucasian female with hypertension, hyperlipidemia, type 2 DM, CAD, SVT, bipolar disorder, OSA on CPAP, morbid obesity  Patient has had worsening dyspnea on exertion. She has been getting worse and had to stop multiple times when walking from the waiting room to the echo room. Patient states this is the same as when she had a stent placed over 12 years ago. Despite medication changes and close follow-up, patient continues to have progressive dyspnea on exertion.      Current Outpatient Medications:    albuterol (VENTOLIN HFA) 108 (90 Base) MCG/ACT inhaler, Inhale 2 puffs into the lungs at bedtime as needed for wheezing or shortness of breath., Disp: , Rfl:    aspirin 81 MG EC tablet, Take 81 mg by mouth at bedtime., Disp: , Rfl:    CALCIUM CITRATE PO, Take 4 tablets by mouth at bedtime., Disp: , Rfl:    citalopram (CELEXA) 40 MG tablet, Take 40 mg by mouth at bedtime., Disp: , Rfl:    clonazePAM (KLONOPIN) 0.5 MG tablet, Take 1 mg by mouth at bedtime., Disp: , Rfl:    diclofenac Sodium (VOLTAREN) 1 % GEL, Apply 2 g topically daily as needed (pain)., Disp: , Rfl:    diltiazem (CARDIZEM CD) 120 MG 24 hr capsule, TAKE 1 CAPSULE BY MOUTH EVERY DAY (Patient taking differently: Take 240 mg by mouth daily.), Disp: 90 capsule, Rfl: 1   ezetimibe (ZETIA) 10 MG tablet, Take 10 mg by mouth every morning., Disp: , Rfl:    GVOKE HYPOPEN 2-PACK 1 MG/0.2ML SOAJ, Take 1 mg by mouth as needed (Low blood glucose)., Disp: , Rfl:    HYDROcodone-acetaminophen (NORCO) 7.5-325 MG tablet, Take 1 tablet by mouth every 6 (six) hours as needed  for severe pain., Disp: , Rfl:    ibuprofen (ADVIL) 200 MG tablet, Take 800 mg by mouth daily as needed for headache, mild pain or moderate pain (pain)., Disp: , Rfl:    insulin detemir (LEVEMIR) 100 UNIT/ML injection, Inject 60 Units into the skin 2 (two) times daily., Disp: , Rfl:    insulin lispro (HUMALOG) 100 UNIT/ML injection, Inject 10-20 Units into the skin See admin instructions. Inject 10-20 units subcutaneously up to 3 times daily - sliding scale is based on CBG, Disp: , Rfl:    labetalol (NORMODYNE) 300 MG tablet, Take 300 mg by mouth in the morning and at bedtime., Disp: , Rfl:    lamoTRIgine (LAMICTAL) 200 MG tablet, Take 200 mg by mouth 2 (two) times daily., Disp: , Rfl:    levocetirizine (XYZAL) 5 MG tablet, Take 5 mg by mouth at bedtime., Disp: , Rfl:    losartan (COZAAR) 25 MG tablet, TAKE 1 TABLET (25 MG TOTAL) BY MOUTH DAILY. (Patient taking differently: Take 25 mg by mouth every morning.), Disp: 90 tablet, Rfl: 1   nitroGLYCERIN (NITROSTAT) 0.4 MG SL tablet, Place 1 tablet (0.4 mg total) under the tongue every 5 (five) minutes as needed for chest pain., Disp: 30 tablet, Rfl: 3   Olopatadine HCl (PATADAY OP), Place 1 drop into both eyes at bedtime. Extra  strength, Disp: , Rfl:    Omega-3 Fatty Acids (FISH OIL OMEGA-3) 1000 MG CAPS, Take 1,000 mg by mouth at bedtime., Disp: , Rfl:    omeprazole (PRILOSEC) 20 MG capsule, Take 20 mg by mouth every morning., Disp: , Rfl:    sodium chloride (OCEAN) 0.65 % nasal spray, Place 2 sprays into the nose at bedtime. Ayr, Disp: , Rfl:   Cardiovascular and other pertinent studies:  EKG 09/25/2021: Sinus rhythm 66 bpm Poor R-wave progression  Lexiscan Tetrofosmin stress test 09/05/2020: Lexiscan nuclear stress test performed using 1-day protocol. Patient reported 2/10 chest pain, dyspnea, and dizziness. SPECT images show small sized, mild intensity, reversible perfusion defect in basal anteroseptal myocardium. Stress LVEF is calculated 49%,  but visually appears normal. Low risk study.  Echocardiogram 09/05/2020:  Left ventricle cavity is normal in size. Moderate concentric hypertrophy  of the left ventricle. Normal global wall motion. Normal LV systolic  function with EF 60%. Normal diastolic filling pattern.  Structurally normal trileaflet aortic valve. No evidence of aortic  stenosis. Moderate (Grade II) aortic regurgitation.  Moderate (Grade II) mitral regurgitation.  Mild tricuspid regurgitation. Estimated pulmonary artery systolic pressure  26 mmHg.  Mild pulmonic regurgitation.  Mobile cardiac telemetry 13 days 07/25/2020 - 08/08/2020: Dominant rhythm: Sinus. HR 58-150 bpm. Avg HR 71 bpm. 37 episodes of SVT, fastest at 126 bpm for 9 beats, longest for 12 beats at 105 bpm. <1% isolated SVE, couplet/triplets. 1 episode of VT, at 150 bpm for 4 beats <1% isolated VE, no couplet/triplets. No atrial fibrillation/atrial flutter/high grade AV block, sinus pause >3sec noted. 34 patient triggered events, correlated with sinus rhythm without any arrhthymias.  EKG 07/25/2020: Sinus rhythm 66 bpm Poor R-wave progression, otherwise normal EKG  EKG 03/14/2022: unchanged from prior   Recent labs: 10/13/2021: Glucose 130, BUN/Cr 22/1.30. EGFR 44. Na/K 140/4.4. Rest of the CMP normal H/H 12/38. MCV 88. Platelets 214 Trop HS 6,7  05/30/2021: Glucose 194, BUN/Cr 20/1.52. EGFR 39. Na/K 141/4.9. Rest of the CMP normal H/H 12/37. MCV 88. Platelets 236 HbA1C 7.2% Chol 158, TG 117, HDL 71, LDL 64  06/17/2020: Glucose 259, BUN/Cr 19/1.3. EGFR 44. Na/K 141/5.2. AlKP 132. Rest of the CMP normal H/H 13/41. MCV 87. Platelets 250 HbA1C 8.0% Chol 268, TG 181, HDL 81, LDL 155 TSH 1.5 normal    Review of Systems  Cardiovascular:  Positive for dyspnea on exertion. Negative for chest pain, leg swelling, palpitations and syncope (No recurrence).  Respiratory:  Positive for shortness of breath.   Neurological:  Negative for  light-headedness.         Vitals:   03/14/22 1433  BP: 135/62  Pulse: 64  SpO2: 92%     Body mass index is 40.6 kg/m. Filed Weights   03/14/22 1433  Weight: 267 lb (121.1 kg)     Objective:   Physical Exam Vitals and nursing note reviewed.  Constitutional:      General: She is not in acute distress. Neck:     Vascular: No JVD.  Cardiovascular:     Rate and Rhythm: Normal rate and regular rhythm.     Heart sounds: Normal heart sounds. No murmur heard. Pulmonary:     Effort: Pulmonary effort is normal.     Breath sounds: Normal breath sounds. No wheezing or rales.  Musculoskeletal:     Right lower leg: No edema.     Left lower leg: No edema.  Neurological:     Mental Status: She is alert.  Assessment & Recommendations:   72 y.o. Caucasian female with hypertension, hyperlipidemia, type 2 DM, CAD, SVT, bipolar disorder, OSA on CPAP, morbid obesity  Exertional dyspnea: Likely multifactorial in the setting of recent COVID, obesity, deconditioning, as well as suspicion for angina equivalent. No ischemia on stress testing (08/2020), but symptoms of dyspnea are worsening.  Increased diltiazem to 240 mg daily. Continue Aspirin 81 mg, Crestor 20 mg, labetalol 300 mg bid, SL NTG prn Schedule for cardiac catheterization, and possible angioplasty. We discussed regarding risks, benefits, alternatives to this including stress testing, CTA and continued medical therapy. Patient wants to proceed. Understands <1-2% risk of death, stroke, MI, urgent CABG, bleeding, infection, renal failure but not limited to these. Patient instructed not to do heavy lifting, heavy exertional activity, swimming until evaluation is complete.  Patient instructed to call if symptoms worse or to go to the ED for further evaluation.   Hypertension: Well-controlled  Mixed hyperlipidemia: Well-controlled  F/u in 3-4 weeks    Floydene Flock, [place], [facility] Pager: [phone] Office:  [phone]

## 2022-03-21 ENCOUNTER — Encounter (HOSPITAL_COMMUNITY): Payer: Self-pay | Admitting: Cardiology

## 2022-03-22 ENCOUNTER — Ambulatory Visit: Payer: 59 | Admitting: Cardiology

## 2022-03-27 ENCOUNTER — Encounter: Payer: Self-pay | Admitting: Pulmonary Disease

## 2022-03-27 ENCOUNTER — Ambulatory Visit (INDEPENDENT_AMBULATORY_CARE_PROVIDER_SITE_OTHER): Payer: 59 | Admitting: Pulmonary Disease

## 2022-03-27 VITALS — HR 60 | Ht 68.0 in | Wt 260.0 lb

## 2022-03-27 DIAGNOSIS — R0609 Other forms of dyspnea: Secondary | ICD-10-CM | POA: Diagnosis not present

## 2022-03-27 DIAGNOSIS — R002 Palpitations: Secondary | ICD-10-CM | POA: Diagnosis not present

## 2022-03-27 NOTE — Patient Instructions (Addendum)
Nice to see you  We will get PFTs (pulmonary function tests) to see if we can identify reasons for your shortness of breath related to the lungs  Return to clinic in 4-6 weeks after PFT (next available) to discuss results

## 2022-03-29 ENCOUNTER — Other Ambulatory Visit: Payer: Self-pay | Admitting: Cardiology

## 2022-03-29 DIAGNOSIS — I10 Essential (primary) hypertension: Secondary | ICD-10-CM

## 2022-04-02 NOTE — Progress Notes (Signed)
@Patient  ID: Suzanne Patton, female    DOB: 17-Mar-1951, 72 y.o.   MRN: [id number]  Chief Complaint  Patient presents with   Consult    Pt is here for consult for SOB. Pt states the SOB has been coming and going since June 2023.     Referring provider: [name], DO  HPI:   72 y.o. woman whom we are seeing for evaluation of dyspnea on exertion.  Most recent cardiology note x 2 reviewed.  ED note 09/2021 reviewed.  Discharge summary 07/2021 reviewed.  Patient describes dyspnea on exertion.  Present for several months.  Worse on inclines or stairs.  Present on flat surfaces as well.  Over longer distances.  No time of day when things are better or worse.  No position makes it better or worse.  No seasonal or environmental factors she can identify that makes things better or worse.  Has tried albuterol without improvement.  No other relieving or exacerbating factors.  She had chest x-ray 09/2021 that reveals clear lungs bilaterally on my review and interpretation.  TTE 02/2022 personally reviewed with left atrial dilation, moderate AI, mild MR, grade 1 diastolic function, right side looks okay.  Left heart catheterization 03/20/2022 with LVEDP of 20, nonobstructive coronary disease.  Questionaires / Pulmonary Flowsheets:   ACT:      No data to display          MMRC:     No data to display          Epworth:      No data to display          Tests:   FENO:  No results found for: "NITRICOXIDE"  PFT:     No data to display          WALK:      No data to display          Imaging: Personally reviewed and as per EMR and discussion in this note LONG TERM MONITOR (3-14 DAYS)  Result Date: 03/27/2022 Mobile cardiac telemetry 11 days 02/22/2022 - 03/05/2022: Dominant rhythm: Sinus. HR 53-88 bpm. Avg HR 63 bpm, in sinus rhythm. 14 episodes of probable atrial tachycardia, fastest at 115 bpm for 4 beats, longest for 7 beats at 94 bpm. <1% isolated SVE,  couplet/triplets.  0 episodes of VT. <1% isolated VE, no couplet/triplets. No atrial fibrillation/atrial flutter/VT/high grade AV block, sinus pause >3sec noted. 6 patient triggered events, correlated with sinus rhythm/artifact. No arrhythmia noted.   CARDIAC CATHETERIZATION  Result Date: 03/20/2022 Images from the original result were not included. LM: Normal LAD: Prox-mid 40% disease         Diag 1 40% disease Lcx: Long diffuse 70% stenosis in prox-mid Lcx        RFR 0.93        70% long disease in OM1 RCA: Patent prox-mid RCA stents with minimal 10% lumen loss LVEDP 20 mmHg Obesity and possible diastolic heart failure most likely cause of patient's exertional dyspnea symptoms Nigel Mormon, MD Pager: [phone] Office: [phone]  PCV ECHOCARDIOGRAM COMPLETE  Result Date: 03/15/2022 Echocardiogram 03/14/2022:  Normal LV systolic function with visual EF 55-60%. Left ventricle cavity is normal in size. Mild concentric hypertrophy of the left ventricle. Normal global wall motion. Doppler evidence of grade I (impaired) diastolic dysfunction, normal LAP. Calculated EF 60%. Left atrial cavity is moderately dilated at 42 ml/m^2. An atrial septal aneurysm with a patent foramen ovale is present. Structurally normal trileaflet aortic valve.  Mild to moderate aortic regurgitation. Structurally normal mitral valve.  Mild (Grade I) mitral regurgitation. Structurally normal tricuspid valve.  Mild tricuspid regurgitation. No evidence of pulmonary hypertension. Compared to 08/2020, AI and MR have improved.    Lab Results: Personally reviewed CBC    Component Value Date/Time   WBC 7.8 03/14/2022 1527   WBC 6.9 10/13/2021 0820   RBC 4.28 03/14/2022 1527   RBC 4.33 10/13/2021 0820   HGB 11.9 03/14/2022 1527   HCT 37.7 03/14/2022 1527   PLT 276 03/14/2022 1527   MCV 88 03/14/2022 1527   MCH 27.8 03/14/2022 1527   MCH 28.4 10/13/2021 0820   MCHC 31.6 03/14/2022 1527   MCHC 31.9 10/13/2021 0820   RDW 13.3 03/14/2022  1527   LYMPHSABS 1.2 10/13/2021 0820   MONOABS 0.3 10/13/2021 0820   EOSABS 0.1 10/13/2021 0820   BASOSABS 0.0 10/13/2021 0820    BMET    Component Value Date/Time   NA 136 03/14/2022 1527   K 5.2 03/14/2022 1527   CL 98 03/14/2022 1527   CO2 22 03/14/2022 1527   GLUCOSE 256 (H) 03/14/2022 1527   GLUCOSE 130 (H) 10/13/2021 0820   BUN 28 (H) 03/14/2022 1527   CREATININE 1.73 (H) 03/14/2022 1527   CALCIUM 9.4 03/14/2022 1527   GFRNONAA 44 (L) 10/13/2021 0820    BNP No results found for: "BNP"  ProBNP No results found for: "PROBNP"  Specialty Problems       Pulmonary Problems   OSA (obstructive sleep apnea)    Reports diagnosis made in 2000, recently mask issues but does not remove the mask during the night currently      Exertional dyspnea    Allergies  Allergen Reactions   Dulaglutide Shortness Of Breath   Semaglutide(0.25 Or 0.5mg -Dos) Nausea And Vomiting and Nausea Only   Meclizine Other (See Comments)    Psychotic episode   Other Other (See Comments)    A- blood type diet Low fat, Healthy heart   Prednisone Other (See Comments)    hyperglycemia   Quinolones Other (See Comments)    severe muscle aches     There is no immunization history on file for this patient.  Past Medical History:  Diagnosis Date   Blood transfusion declined because patient is Jehovah's Witness    CAD (coronary artery disease)    Complication of anesthesia    Diabetes mellitus without complication (HCC)    Hyperlipidemia    Hypertension    Neuromuscular disorder (HCC)    PVD (peripheral vascular disease) (HCC)    s/p carotid stent   Sleep apnea    Stage 3b chronic kidney disease (CKD) (HCC)     Tobacco History: Social History   Tobacco Use  Smoking Status Never  Smokeless Tobacco Never   Counseling given: Not Answered   Continue to not smoke  Outpatient Encounter Medications as of 03/27/2022  Medication Sig   albuterol (VENTOLIN HFA) 108 (90 Base) MCG/ACT  inhaler Inhale 2 puffs into the lungs at bedtime as needed for wheezing or shortness of breath.   aspirin 81 MG EC tablet Take 81 mg by mouth at bedtime.   CALCIUM CITRATE PO Take 4 tablets by mouth at bedtime.   citalopram (CELEXA) 40 MG tablet Take 40 mg by mouth at bedtime.   clonazePAM (KLONOPIN) 0.5 MG tablet Take 1 mg by mouth at bedtime.   diclofenac Sodium (VOLTAREN) 1 % GEL Apply 2 g topically daily as needed (pain).   diltiazem (CARDIZEM  CD) 120 MG 24 hr capsule TAKE 1 CAPSULE BY MOUTH EVERY DAY (Patient taking differently: Take 240 mg by mouth daily.)   ezetimibe (ZETIA) 10 MG tablet Take 10 mg by mouth every morning.   GVOKE HYPOPEN 2-PACK 1 MG/0.2ML SOAJ Take 1 mg by mouth as needed (Low blood glucose).   HYDROcodone-acetaminophen (NORCO) 7.5-325 MG tablet Take 1 tablet by mouth every 6 (six) hours as needed for severe pain.   ibuprofen (ADVIL) 200 MG tablet Take 800 mg by mouth daily as needed for headache, mild pain or moderate pain (pain).   insulin detemir (LEVEMIR) 100 UNIT/ML injection Inject 60 Units into the skin 2 (two) times daily.   insulin lispro (HUMALOG) 100 UNIT/ML injection Inject 10-20 Units into the skin See admin instructions. Inject 10-20 units subcutaneously up to 3 times daily - sliding scale is based on CBG   labetalol (NORMODYNE) 300 MG tablet Take 300 mg by mouth in the morning and at bedtime.   lamoTRIgine (LAMICTAL) 200 MG tablet Take 200 mg by mouth 2 (two) times daily.   levocetirizine (XYZAL) 5 MG tablet Take 5 mg by mouth at bedtime.   Olopatadine HCl (PATADAY OP) Place 1 drop into both eyes at bedtime. Extra strength   Omega-3 Fatty Acids (FISH OIL OMEGA-3) 1000 MG CAPS Take 1,000 mg by mouth at bedtime.   omeprazole (PRILOSEC) 20 MG capsule Take 20 mg by mouth every morning.   sodium chloride (OCEAN) 0.65 % nasal spray Place 2 sprays into the nose at bedtime. Ayr   nitroGLYCERIN (NITROSTAT) 0.4 MG SL tablet Place 1 tablet (0.4 mg total) under the  tongue every 5 (five) minutes as needed for chest pain.   [DISCONTINUED] losartan (COZAAR) 25 MG tablet TAKE 1 TABLET (25 MG TOTAL) BY MOUTH DAILY. (Patient taking differently: Take 25 mg by mouth every morning.)   No facility-administered encounter medications on file as of 03/27/2022.     Review of Systems  Review of Systems  No chest pain with exertion.  No orthopnea or PND.  Comprehensive review of systems otherwise negative. Physical Exam  Pulse 60   Ht 5\' 8"  (1.727 m)   Wt 260 lb (117.9 kg)   SpO2 93%   BMI 39.53 kg/m   Wt Readings from Last 5 Encounters:  03/27/22 260 lb (117.9 kg)  03/20/22 260 lb (117.9 kg)  03/14/22 267 lb (121.1 kg)  02/22/22 269 lb (122 kg)  10/13/21 254 lb (115.2 kg)    BMI Readings from Last 5 Encounters:  03/27/22 39.53 kg/m  03/20/22 39.53 kg/m  03/14/22 40.60 kg/m  02/22/22 40.90 kg/m  10/13/21 38.62 kg/m     Physical Exam General: Sitting in chair, no acute distress Eyes: EOMI, no icterus Neck: Supple, no JVP appreciated sitting upright Pulmonary: Clear, normal work of breathing Cardiovascular: Warm, regular rate Abdomen: Distended, bowel sounds present MSK: No synovitis, no joint effusion Neuro: Normal gait, no weakness Psych: Normal mood, full affect   Assessment & Plan:   Dyspnea on exertion: Suspect largely driven by heart failure and diastolic dysfunction.  Recent echocardiogram with left atrial dilation, moderate AI, mild MR, grade 1 diastolic dysfunction.  Left heart cath 03/20/2022 with LVEDP of 20.  Likely contribution of deconditioning, habitus as well.  No improvement with albuterol inhaler.  Chest x-ray 10/13/2021 clear.  PFTs for further evaluation.   Return in about 6 weeks (around 05/08/2022).   Lanier Clam, MD 04/02/2022   This appointment required 60 minutes of patient care (  this includes precharting, chart review, review of results, face-to-face care, etc.).

## 2022-04-03 DIAGNOSIS — G4733 Obstructive sleep apnea (adult) (pediatric): Secondary | ICD-10-CM | POA: Diagnosis not present

## 2022-04-04 ENCOUNTER — Ambulatory Visit: Payer: 59 | Admitting: Cardiology

## 2022-04-04 DIAGNOSIS — M160 Bilateral primary osteoarthritis of hip: Secondary | ICD-10-CM | POA: Diagnosis not present

## 2022-04-04 NOTE — Progress Notes (Incomplete)
Patient referred by Irena Reichmann, DO for coronary artery disease  Subjective:   Suzanne Patton, female    DOB: [birth year], 72 y.o.   MRN: [id number]   No chief complaint on file.    Shortness of Breath Pertinent negatives include no chest pain, leg swelling or syncope (No recurrence).    72 y.o. Caucasian female with hypertension, hyperlipidemia, type 2 DM, CAD, SVT, bipolar disorder, OSA on CPAP, morbid obesity  Patient has had worsening dyspnea on exertion. She has been getting worse and had to stop multiple times when walking from the waiting room to the echo room. Patient states this is the same as when she had a stent placed over 12 years ago. Despite medication changes and close follow-up, patient continues to have progressive dyspnea on exertion.      Current Outpatient Medications:  .  albuterol (VENTOLIN HFA) 108 (90 Base) MCG/ACT inhaler, Inhale 2 puffs into the lungs at bedtime as needed for wheezing or shortness of breath., Disp: , Rfl:  .  aspirin 81 MG EC tablet, Take 81 mg by mouth at bedtime., Disp: , Rfl:  .  CALCIUM CITRATE PO, Take 4 tablets by mouth at bedtime., Disp: , Rfl:  .  citalopram (CELEXA) 40 MG tablet, Take 40 mg by mouth at bedtime., Disp: , Rfl:  .  clonazePAM (KLONOPIN) 0.5 MG tablet, Take 1 mg by mouth at bedtime., Disp: , Rfl:  .  diclofenac Sodium (VOLTAREN) 1 % GEL, Apply 2 g topically daily as needed (pain)., Disp: , Rfl:  .  diltiazem (CARDIZEM CD) 120 MG 24 hr capsule, TAKE 1 CAPSULE BY MOUTH EVERY DAY (Patient taking differently: Take 240 mg by mouth daily.), Disp: 90 capsule, Rfl: 1 .  ezetimibe (ZETIA) 10 MG tablet, Take 10 mg by mouth every morning., Disp: , Rfl:  .  GVOKE HYPOPEN 2-PACK 1 MG/0.2ML SOAJ, Take 1 mg by mouth as needed (Low blood glucose)., Disp: , Rfl:  .  HYDROcodone-acetaminophen (NORCO) 7.5-325 MG tablet, Take 1 tablet by mouth every 6 (six) hours as needed for severe pain., Disp: , Rfl:  .  ibuprofen (ADVIL) 200 MG  tablet, Take 800 mg by mouth daily as needed for headache, mild pain or moderate pain (pain)., Disp: , Rfl:  .  insulin detemir (LEVEMIR) 100 UNIT/ML injection, Inject 60 Units into the skin 2 (two) times daily., Disp: , Rfl:  .  insulin lispro (HUMALOG) 100 UNIT/ML injection, Inject 10-20 Units into the skin See admin instructions. Inject 10-20 units subcutaneously up to 3 times daily - sliding scale is based on CBG, Disp: , Rfl:  .  labetalol (NORMODYNE) 300 MG tablet, Take 300 mg by mouth in the morning and at bedtime., Disp: , Rfl:  .  lamoTRIgine (LAMICTAL) 200 MG tablet, Take 200 mg by mouth 2 (two) times daily., Disp: , Rfl:  .  levocetirizine (XYZAL) 5 MG tablet, Take 5 mg by mouth at bedtime., Disp: , Rfl:  .  losartan (COZAAR) 25 MG tablet, Take 1 tablet (25 mg total) by mouth daily., Disp: 90 tablet, Rfl: 1 .  nitroGLYCERIN (NITROSTAT) 0.4 MG SL tablet, Place 1 tablet (0.4 mg total) under the tongue every 5 (five) minutes as needed for chest pain., Disp: 30 tablet, Rfl: 3 .  Olopatadine HCl (PATADAY OP), Place 1 drop into both eyes at bedtime. Extra strength, Disp: , Rfl:  .  Omega-3 Fatty Acids (FISH OIL OMEGA-3) 1000 MG CAPS, Take 1,000 mg by mouth at bedtime., Disp: ,  Rfl:  .  omeprazole (PRILOSEC) 20 MG capsule, Take 20 mg by mouth every morning., Disp: , Rfl:  .  sodium chloride (OCEAN) 0.65 % nasal spray, Place 2 sprays into the nose at bedtime. Ayr, Disp: , Rfl:   Cardiovascular and other pertinent studies:  Coronary angiography 03/20/2022: LM: Normal LAD: Prox-mid 40% disease         Diag 1 40% disease Lcx: Long diffuse 70% stenosis in prox-mid Lcx        RFR 0.93        70% long disease in OM1 RCA: Patent prox-mid RCA stents with minimal 10% lumen loss   LVEDP 20 mmHg   Obesity and possible diastolic heart failure most likely cause of patient's exertional dyspnea symptoms  EKG 09/25/2021: Sinus rhythm 66 bpm Poor R-wave progression  Lexiscan Tetrofosmin stress test  09/05/2020: Lexiscan nuclear stress test performed using 1-day protocol. Patient reported 2/10 chest pain, dyspnea, and dizziness. SPECT images show small sized, mild intensity, reversible perfusion defect in basal anteroseptal myocardium. Stress LVEF is calculated 49%, but visually appears normal. Low risk study.  Echocardiogram 09/05/2020:  Left ventricle cavity is normal in size. Moderate concentric hypertrophy  of the left ventricle. Normal global wall motion. Normal LV systolic  function with EF 60%. Normal diastolic filling pattern.  Structurally normal trileaflet aortic valve. No evidence of aortic  stenosis. Moderate (Grade II) aortic regurgitation.  Moderate (Grade II) mitral regurgitation.  Mild tricuspid regurgitation. Estimated pulmonary artery systolic pressure  26 mmHg.  Mild pulmonic regurgitation.  Mobile cardiac telemetry 13 days 07/25/2020 - 08/08/2020: Dominant rhythm: Sinus. HR 58-150 bpm. Avg HR 71 bpm. 37 episodes of SVT, fastest at 126 bpm for 9 beats, longest for 12 beats at 105 bpm. <1% isolated SVE, couplet/triplets. 1 episode of VT, at 150 bpm for 4 beats <1% isolated VE, no couplet/triplets. No atrial fibrillation/atrial flutter/high grade AV block, sinus pause >3sec noted. 34 patient triggered events, correlated with sinus rhythm without any arrhthymias.  EKG 07/25/2020: Sinus rhythm 66 bpm Poor R-wave progression, otherwise normal EKG  EKG 03/14/2022: unchanged from prior   Recent labs: 10/13/2021: Glucose 130, BUN/Cr 22/1.30. EGFR 44. Na/K 140/4.4. Rest of the CMP normal H/H 12/38. MCV 88. Platelets 214 Trop HS 6,7  05/30/2021: Glucose 194, BUN/Cr 20/1.52. EGFR 39. Na/K 141/4.9. Rest of the CMP normal H/H 12/37. MCV 88. Platelets 236 HbA1C 7.2% Chol 158, TG 117, HDL 71, LDL 64  06/17/2020: Glucose 259, BUN/Cr 19/1.3. EGFR 44. Na/K 141/5.2. AlKP 132. Rest of the CMP normal H/H 13/41. MCV 87. Platelets 250 HbA1C 8.0% Chol 268, TG 181, HDL 81, LDL  155 TSH 1.5 normal    Review of Systems  Cardiovascular:  Positive for dyspnea on exertion. Negative for chest pain, leg swelling, palpitations and syncope (No recurrence).  Respiratory:  Positive for shortness of breath.   Neurological:  Negative for light-headedness.         There were no vitals filed for this visit.    There is no height or weight on file to calculate BMI. There were no vitals filed for this visit.    Objective:   Physical Exam Vitals and nursing note reviewed.  Constitutional:      General: She is not in acute distress. Neck:     Vascular: No JVD.  Cardiovascular:     Rate and Rhythm: Normal rate and regular rhythm.     Heart sounds: Normal heart sounds. No murmur heard. Pulmonary:     Effort:  Pulmonary effort is normal.     Breath sounds: Normal breath sounds. No wheezing or rales.  Musculoskeletal:     Right lower leg: No edema.     Left lower leg: No edema.  Neurological:     Mental Status: She is alert.        Assessment & Recommendations:   72 y.o. Caucasian female with hypertension, hyperlipidemia, type 2 DM, CAD, SVT, bipolar disorder, OSA on CPAP, morbid obesity  Exertional dyspnea: Likely multifactorial in the setting of recent COVID, obesity, deconditioning, as well as suspicion for angina equivalent. No ischemia on stress testing (08/2020), but symptoms of dyspnea are worsening.  Increased diltiazem to 240 mg daily. Continue Aspirin 81 mg, Crestor 20 mg, labetalol 300 mg bid, SL NTG prn Schedule for cardiac catheterization, and possible angioplasty. We discussed regarding risks, benefits, alternatives to this including stress testing, CTA and continued medical therapy. Patient wants to proceed. Understands <1-2% risk of death, stroke, MI, urgent CABG, bleeding, infection, renal failure but not limited to these. Patient instructed not to do heavy lifting, heavy exertional activity, swimming until evaluation is complete.  Patient  instructed to call if symptoms worse or to go to the ED for further evaluation.   Hypertension: Well-controlled  Mixed hyperlipidemia: Well-controlled  F/u in 3-4 weeks    Floydene Flock, [place], [facility] Pager: [phone] Office: [phone]

## 2022-04-06 ENCOUNTER — Encounter: Payer: Self-pay | Admitting: Cardiology

## 2022-04-06 ENCOUNTER — Ambulatory Visit: Payer: 59 | Admitting: Cardiology

## 2022-04-06 VITALS — BP 128/60 | HR 64 | Ht 68.0 in | Wt 260.4 lb

## 2022-04-06 DIAGNOSIS — I5032 Chronic diastolic (congestive) heart failure: Secondary | ICD-10-CM

## 2022-04-06 DIAGNOSIS — I10 Essential (primary) hypertension: Secondary | ICD-10-CM

## 2022-04-06 MED ORDER — FUROSEMIDE 40 MG PO TABS: 40.0000 mg | ORAL_TABLET | Freq: Every day | ORAL | 3 refills | Status: DC

## 2022-04-06 NOTE — Progress Notes (Signed)
Patient referred by [name], DO for coronary artery disease  Subjective:   Suzanne Patton, female    DOB: 1950-09-21, 72 y.o.   MRN: [id number]   Chief Complaint  Patient presents with   Coronary artery disease involving native coronary artery of   Follow-up    Post cath     HPI  72 y.o. Caucasian female with hypertension, hyperlipidemia, type 2 DM, CAD, SVT, bipolar disorder, OSA on CPAP, morbid obesity  Patient has lost 7 lbs since last visit. She continues to have exertional dyspnea symptoms.   Initial consultation HPI 07/2020: Patient has known CAD with PCI in 2012, has h/o SVT with ablations, all performed in [place].   Patient moved to [place] in July 2020 to be with her daughter.  Most recently, she lived in [place], although she has previously lived in several different states.  Her last visit with her cardiologist in [place] was in September 2020.  Patient has had left-sided chest pressure symptoms with physical and emotional stress, relieved with stress, for long time.  This had been stable, but increased this past winter.  Patient has 2-3 episodes a week.  She is not on any short or long-acting nitroglycerin.  Patient reports episodes of lightheadedness followed by syncope, most recent episode 3 weeks ago.  She has several other episodes of aborted syncope.  She denies any chest pain or shortness of breath prior to her syncope episode.  She is reportedly undergone extensive neurological work-up in [place], which was reportedly unyielding.  Her diabetes remains uncontrolled.  She is now established care with Dr. [name], with whom she is working actively on management of her diabetes.   Current Outpatient Medications:    albuterol (VENTOLIN HFA) 108 (90 Base) MCG/ACT inhaler, Inhale 2 puffs into the lungs at bedtime as needed for wheezing or shortness of breath., Disp: , Rfl:    aspirin 81 MG EC tablet, Take 81 mg by mouth at bedtime., Disp: , Rfl:    CALCIUM  CITRATE PO, Take 4 tablets by mouth at bedtime., Disp: , Rfl:    citalopram (CELEXA) 40 MG tablet, Take 40 mg by mouth at bedtime., Disp: , Rfl:    clonazePAM (KLONOPIN) 0.5 MG tablet, Take 1 mg by mouth at bedtime., Disp: , Rfl:    diclofenac Sodium (VOLTAREN) 1 % GEL, Apply 2 g topically daily as needed (pain)., Disp: , Rfl:    diltiazem (CARDIZEM CD) 240 MG 24 hr capsule, Take 240 mg by mouth daily., Disp: , Rfl:    ezetimibe (ZETIA) 10 MG tablet, Take 10 mg by mouth every morning., Disp: , Rfl:    furosemide (LASIX) 40 MG tablet, Take 1 tablet (40 mg total) by mouth daily., Disp: 90 tablet, Rfl: 3   GVOKE HYPOPEN 2-PACK 1 MG/0.2ML SOAJ, Take 1 mg by mouth as needed (Low blood glucose)., Disp: , Rfl:    HYDROcodone-acetaminophen (NORCO) 7.5-325 MG tablet, Take 1 tablet by mouth every 6 (six) hours as needed for severe pain., Disp: , Rfl:    ibuprofen (ADVIL) 200 MG tablet, Take 800 mg by mouth daily as needed for headache, mild pain or moderate pain (pain)., Disp: , Rfl:    insulin detemir (LEVEMIR) 100 UNIT/ML injection, Inject 60 Units into the skin 2 (two) times daily., Disp: , Rfl:    insulin lispro (HUMALOG) 100 UNIT/ML injection, Inject 10-20 Units into the skin See admin instructions. Inject 10-20 units subcutaneously up to 3 times daily - sliding scale  is based on CBG, Disp: , Rfl:    labetalol (NORMODYNE) 300 MG tablet, Take 300 mg by mouth in the morning and at bedtime., Disp: , Rfl:    lamoTRIgine (LAMICTAL) 200 MG tablet, Take 200 mg by mouth 2 (two) times daily., Disp: , Rfl:    levocetirizine (XYZAL) 5 MG tablet, Take 5 mg by mouth at bedtime., Disp: , Rfl:    losartan (COZAAR) 25 MG tablet, Take 1 tablet (25 mg total) by mouth daily., Disp: 90 tablet, Rfl: 1   nitroGLYCERIN (NITROSTAT) 0.4 MG SL tablet, Place 1 tablet (0.4 mg total) under the tongue every 5 (five) minutes as needed for chest pain., Disp: 30 tablet, Rfl: 3   Olopatadine HCl (PATADAY OP), Place 1 drop into both eyes  at bedtime. Extra strength, Disp: , Rfl:    Omega-3 Fatty Acids (FISH OIL OMEGA-3) 1000 MG CAPS, Take 1,000 mg by mouth at bedtime., Disp: , Rfl:    omeprazole (PRILOSEC) 20 MG capsule, Take 20 mg by mouth every morning., Disp: , Rfl:    sodium chloride (OCEAN) 0.65 % nasal spray, Place 2 sprays into the nose at bedtime. Ayr, Disp: , Rfl:   Cardiovascular and other pertinent studies:  Coronary angiography 03/20/2022: LM: Normal LAD: Prox-mid 40% disease         Diag 1 40% disease Lcx: Long diffuse 70% stenosis in prox-mid Lcx        RFR 0.93        70% long disease in OM1 RCA: Patent prox-mid RCA stents with minimal 10% lumen loss   LVEDP 20 mmHg   Obesity and possible diastolic heart failure most likely cause of patient's exertional dyspnea symptoms  EKG 09/25/2021: Sinus rhythm 66 bpm Poor R-wave progression  Lexiscan Tetrofosmin stress test 09/05/2020: Lexiscan nuclear stress test performed using 1-day protocol. Patient reported 2/10 chest pain, dyspnea, and dizziness. SPECT images show small sized, mild intensity, reversible perfusion defect in basal anteroseptal myocardium. Stress LVEF is calculated 49%, but visually appears normal. Low risk study.  Echocardiogram 09/05/2020:  Left ventricle cavity is normal in size. Moderate concentric hypertrophy  of the left ventricle. Normal global wall motion. Normal LV systolic  function with EF 60%. Normal diastolic filling pattern.  Structurally normal trileaflet aortic valve. No evidence of aortic  stenosis. Moderate (Grade II) aortic regurgitation.  Moderate (Grade II) mitral regurgitation.  Mild tricuspid regurgitation. Estimated pulmonary artery systolic pressure  26 mmHg.  Mild pulmonic regurgitation.  Mobile cardiac telemetry 13 days 07/25/2020 - 08/08/2020: Dominant rhythm: Sinus. HR 58-150 bpm. Avg HR 71 bpm. 37 episodes of SVT, fastest at 126 bpm for 9 beats, longest for 12 beats at 105 bpm. <1% isolated SVE,  couplet/triplets. 1 episode of VT, at 150 bpm for 4 beats <1% isolated VE, no couplet/triplets. No atrial fibrillation/atrial flutter/high grade AV block, sinus pause >3sec noted. 34 patient triggered events, correlated with sinus rhythm without any arrhthymias.  EKG 07/25/2020: Sinus rhythm 66 bpm Poor R-wave progression, otherwise normal EKG   Recent labs: 10/13/2021: Glucose 130, BUN/Cr 22/1.30. EGFR 44. Na/K 140/4.4. Rest of the CMP normal H/H 12/38. MCV 88. Platelets 214 Trop HS 6,7  05/30/2021: Glucose 194, BUN/Cr 20/1.52. EGFR 39. Na/K 141/4.9. Rest of the CMP normal H/H 12/37. MCV 88. Platelets 236 HbA1C 7.2% Chol 158, TG 117, HDL 71, LDL 64  06/17/2020: Glucose 782, BUN/Cr 19/1.3. EGFR 44. Na/K 141/5.2. AlKP 132. Rest of the CMP normal H/H 13/41. MCV 87. Platelets 250 HbA1C 8.0% Chol 268,  TG 181, HDL 81, LDL 155 TSH 1.5 normal    Review of Systems  Cardiovascular:  Positive for dyspnea on exertion. Negative for chest pain, leg swelling, palpitations and syncope (No recurrence).  Neurological:  Negative for light-headedness.         Vitals:   04/06/22 1339  BP: 128/60  Pulse: 64  SpO2: 93%     Body mass index is 39.59 kg/m. Filed Weights   04/06/22 1339  Weight: 260 lb 6.4 oz (118.1 kg)     Objective:   Physical Exam Vitals and nursing note reviewed.  Constitutional:      General: She is not in acute distress. Neck:     Vascular: No JVD.  Cardiovascular:     Rate and Rhythm: Normal rate and regular rhythm.     Heart sounds: Normal heart sounds. No murmur heard. Pulmonary:     Effort: Pulmonary effort is normal.     Breath sounds: Normal breath sounds. No wheezing or rales.  Musculoskeletal:     Right lower leg: Edema (1+) present.     Left lower leg: Edema (1+) present.  Neurological:     Mental Status: She is alert.         Assessment & Recommendations:   72 y.o. Caucasian female with hypertension, hyperlipidemia, type 2 DM, CAD,  SVT, bipolar disorder, OSA on CPAP, morbid obesity  Exertional dyspnea: Likely due to HFpEF> Added lasix 40 mg daily. Do not recommend Jardiance at this time due to renal dysfunction.  PSVT: Controlled on metoprolol and diltiazem.  Hypertension: Well-controlled  Mixed hyperlipidemia: Well-controlled  F/u in 3-4 weeks    Nigel Mormon, MD Pager: [phone] Office: [phone]

## 2022-04-07 ENCOUNTER — Encounter: Payer: Self-pay | Admitting: Cardiology

## 2022-04-07 DIAGNOSIS — I5032 Chronic diastolic (congestive) heart failure: Secondary | ICD-10-CM | POA: Insufficient documentation

## 2022-04-09 DIAGNOSIS — E1140 Type 2 diabetes mellitus with diabetic neuropathy, unspecified: Secondary | ICD-10-CM | POA: Diagnosis not present

## 2022-04-09 DIAGNOSIS — E7800 Pure hypercholesterolemia, unspecified: Secondary | ICD-10-CM | POA: Diagnosis not present

## 2022-04-09 DIAGNOSIS — I10 Essential (primary) hypertension: Secondary | ICD-10-CM | POA: Diagnosis not present

## 2022-04-09 DIAGNOSIS — E1159 Type 2 diabetes mellitus with other circulatory complications: Secondary | ICD-10-CM | POA: Diagnosis not present

## 2022-04-10 DIAGNOSIS — R0602 Shortness of breath: Secondary | ICD-10-CM | POA: Diagnosis not present

## 2022-04-10 DIAGNOSIS — I5032 Chronic diastolic (congestive) heart failure: Secondary | ICD-10-CM | POA: Diagnosis not present

## 2022-04-10 DIAGNOSIS — E1159 Type 2 diabetes mellitus with other circulatory complications: Secondary | ICD-10-CM | POA: Diagnosis not present

## 2022-04-18 DIAGNOSIS — E1129 Type 2 diabetes mellitus with other diabetic kidney complication: Secondary | ICD-10-CM | POA: Diagnosis not present

## 2022-04-18 DIAGNOSIS — E7800 Pure hypercholesterolemia, unspecified: Secondary | ICD-10-CM | POA: Diagnosis not present

## 2022-04-18 DIAGNOSIS — E1159 Type 2 diabetes mellitus with other circulatory complications: Secondary | ICD-10-CM | POA: Diagnosis not present

## 2022-04-23 ENCOUNTER — Ambulatory Visit (INDEPENDENT_AMBULATORY_CARE_PROVIDER_SITE_OTHER): Payer: 59 | Admitting: Pulmonary Disease

## 2022-04-23 ENCOUNTER — Ambulatory Visit: Payer: 59 | Admitting: Pulmonary Disease

## 2022-04-23 ENCOUNTER — Encounter: Payer: Self-pay | Admitting: Pulmonary Disease

## 2022-04-23 VITALS — BP 128/66 | HR 60 | Ht 68.0 in | Wt 267.0 lb

## 2022-04-23 DIAGNOSIS — G4733 Obstructive sleep apnea (adult) (pediatric): Secondary | ICD-10-CM | POA: Diagnosis not present

## 2022-04-23 DIAGNOSIS — R682 Dry mouth, unspecified: Secondary | ICD-10-CM

## 2022-04-23 DIAGNOSIS — M1990 Unspecified osteoarthritis, unspecified site: Secondary | ICD-10-CM | POA: Diagnosis not present

## 2022-04-23 DIAGNOSIS — R0609 Other forms of dyspnea: Secondary | ICD-10-CM | POA: Diagnosis not present

## 2022-04-23 LAB — PULMONARY FUNCTION TEST
DL/VA % pred: 117 %
DL/VA: 4.72 ml/min/mmHg/L
DLCO cor % pred: 99 %
DLCO cor: 21.96 ml/min/mmHg
DLCO unc % pred: 94 %
DLCO unc: 20.87 ml/min/mmHg
FEF 25-75 Post: 1.69 L/sec
FEF 25-75 Pre: 1.51 L/sec
FEF2575-%Change-Post: 11 %
FEF2575-%Pred-Post: 82 %
FEF2575-%Pred-Pre: 73 %
FEV1-%Change-Post: 3 %
FEV1-%Pred-Post: 81 %
FEV1-%Pred-Pre: 78 %
FEV1-Post: 2.11 L
FEV1-Pre: 2.04 L
FEV1FVC-%Change-Post: 4 %
FEV1FVC-%Pred-Pre: 97 %
FEV6-%Change-Post: -1 %
FEV6-%Pred-Post: 82 %
FEV6-%Pred-Pre: 84 %
FEV6-Post: 2.71 L
FEV6-Pre: 2.76 L
FEV6FVC-%Pred-Post: 104 %
FEV6FVC-%Pred-Pre: 104 %
FVC-%Change-Post: -1 %
FVC-%Pred-Post: 79 %
FVC-%Pred-Pre: 80 %
FVC-Post: 2.71 L
FVC-Pre: 2.76 L
Post FEV1/FVC ratio: 78 %
Post FEV6/FVC ratio: 100 %
Pre FEV1/FVC ratio: 74 %
Pre FEV6/FVC Ratio: 100 %
RV % pred: 104 %
RV: 2.54 L
TLC % pred: 94 %
TLC: 5.33 L

## 2022-04-23 NOTE — Progress Notes (Signed)
@Patient  ID: Suzanne Patton, female    DOB: 01-Jul-1950, 72 y.o.   MRN: [id number]  Chief Complaint  Patient presents with   Follow-up    Pt is here for follow up for DOE. Pt states that her breathing has gotten worse. She states she feels like is got worse after her abdominal surgery in June of last year. She did full pfts today. No inhalers noted at the moment.     Referring provider: [name], DO  HPI:   72 y.o. woman whom we are seeing for evaluation of dyspnea on exertion.  Most recent cardiology note  reviewed.    Patient returns for routine follow-up.  PFTs performed today.  Reviewed in detail with patient.  This reveals normal spirometry, no bronchodilator response, normal lung volumes with the exception of severely reduced ERV just by body habitus, normal DLCO.  Discussed at length that these are all reassuring.  Lungs are healthy.  This was overall expected given normal chest imaging.  Discussed at length etiology of her dyspnea exertion larger by deconditioning in the setting of musculoskeletal pain, arthritis.  In addition, contribution of CHF with history of elevated LVEDP.  Stressed importance of adherence to diuretics.  Multiple questions answered to the best of my ability.  HPI at initial visit: Patient describes dyspnea on exertion.  Present for several months.  Worse on inclines or stairs.  Present on flat surfaces as well.  Over longer distances.  No time of day when things are better or worse.  No position makes it better or worse.  No seasonal or environmental factors she can identify that makes things better or worse.  Has tried albuterol without improvement.  No other relieving or exacerbating factors.  She had chest x-ray 09/2021 that reveals clear lungs bilaterally on my review and interpretation.  TTE 02/2022 personally reviewed with left atrial dilation, moderate AI, mild MR, grade 1 diastolic function, right side looks okay.  Left heart catheterization 03/20/2022 with  LVEDP of 20, nonobstructive coronary disease.  Questionaires / Pulmonary Flowsheets:   ACT:      No data to display           MMRC:     No data to display           Epworth:      No data to display           Tests:   FENO:  No results found for: "NITRICOXIDE"  PFT:    Latest Ref Rng & Units 04/23/2022   10:55 AM  PFT Results  FVC-Pre L 2.76  P  FVC-Predicted Pre % 80  P  FVC-Post L 2.71  P  FVC-Predicted Post % 79  P  Pre FEV1/FVC % % 74  P  Post FEV1/FCV % % 78  P  FEV1-Pre L 2.04  P  FEV1-Predicted Pre % 78  P  FEV1-Post L 2.11  P  DLCO uncorrected ml/min/mmHg 20.87  P  DLCO UNC% % 94  P  DLCO corrected ml/min/mmHg 21.96  P  DLCO COR %Predicted % 99  P  DLVA Predicted % 117  P  TLC L 5.33  P  TLC % Predicted % 94  P  RV % Predicted % 104  P    P Preliminary result     WALK:      No data to display           Imaging: Personally reviewed and as per EMR and discussion in  this note No results found.  Lab Results: Personally reviewed CBC    Component Value Date/Time   WBC 7.8 03/14/2022 1527   WBC 6.9 10/13/2021 0820   RBC 4.28 03/14/2022 1527   RBC 4.33 10/13/2021 0820   HGB 11.9 03/14/2022 1527   HCT 37.7 03/14/2022 1527   PLT 276 03/14/2022 1527   MCV 88 03/14/2022 1527   MCH 27.8 03/14/2022 1527   MCH 28.4 10/13/2021 0820   MCHC 31.6 03/14/2022 1527   MCHC 31.9 10/13/2021 0820   RDW 13.3 03/14/2022 1527   LYMPHSABS 1.2 10/13/2021 0820   MONOABS 0.3 10/13/2021 0820   EOSABS 0.1 10/13/2021 0820   BASOSABS 0.0 10/13/2021 0820    BMET    Component Value Date/Time   NA 136 03/14/2022 1527   K 5.2 03/14/2022 1527   CL 98 03/14/2022 1527   CO2 22 03/14/2022 1527   GLUCOSE 256 (H) 03/14/2022 1527   GLUCOSE 130 (H) 10/13/2021 0820   BUN 28 (H) 03/14/2022 1527   CREATININE 1.73 (H) 03/14/2022 1527   CALCIUM 9.4 03/14/2022 1527   GFRNONAA 44 (L) 10/13/2021 0820    BNP No results found for: "BNP"  ProBNP No  results found for: "PROBNP"  Specialty Problems       Pulmonary Problems   OSA (obstructive sleep apnea)    Reports diagnosis made in 2000, recently mask issues but does not remove the mask during the night currently      Exertional dyspnea    Allergies  Allergen Reactions   Dulaglutide Shortness Of Breath    Other Reaction(s): respiratory distress   Semaglutide(0.25 Or 0.5mg -Dos) Nausea And Vomiting and Nausea Only   Meclizine Other (See Comments)    Psychotic episode   Other Other (See Comments)    A- blood type diet  Low fat, Healthy heart   Prednisone Other (See Comments)    hyperglycemia   Quinolones Other (See Comments)    severe muscle aches     There is no immunization history on file for this patient.  Past Medical History:  Diagnosis Date   Blood transfusion declined because patient is Jehovah's Witness    CAD (coronary artery disease)    Complication of anesthesia    Diabetes mellitus without complication (HCC)    Hyperlipidemia    Hypertension    Neuromuscular disorder (HCC)    PVD (peripheral vascular disease) (HCC)    s/p carotid stent   Sleep apnea    Stage 3b chronic kidney disease (CKD) (HCC)     Tobacco History: Social History   Tobacco Use  Smoking Status Never  Smokeless Tobacco Never   Counseling given: Not Answered   Continue to not smoke  Outpatient Encounter Medications as of 04/23/2022  Medication Sig   albuterol (VENTOLIN HFA) 108 (90 Base) MCG/ACT inhaler Inhale 2 puffs into the lungs at bedtime as needed for wheezing or shortness of breath.   aspirin 81 MG EC tablet Take 81 mg by mouth at bedtime.   CALCIUM CITRATE PO Take 4 tablets by mouth at bedtime.   citalopram (CELEXA) 40 MG tablet Take 40 mg by mouth at bedtime.   clonazePAM (KLONOPIN) 0.5 MG tablet Take 1 mg by mouth at bedtime.   diclofenac Sodium (VOLTAREN) 1 % GEL Apply 2 g topically daily as needed (pain).   diltiazem (CARDIZEM CD) 240 MG 24 hr capsule Take 240  mg by mouth daily.   ezetimibe (ZETIA) 10 MG tablet Take 10 mg by mouth every  Patton.   furosemide (LASIX) 40 MG tablet Take 1 tablet (40 mg total) by mouth daily.   GVOKE HYPOPEN 2-PACK 1 MG/0.2ML SOAJ Take 1 mg by mouth as needed (Low blood glucose).   HYDROcodone-acetaminophen (NORCO) 7.5-325 MG tablet Take 1 tablet by mouth every 6 (six) hours as needed for severe pain.   ibuprofen (ADVIL) 200 MG tablet Take 800 mg by mouth daily as needed for headache, mild pain or moderate pain (pain).   insulin detemir (LEVEMIR) 100 UNIT/ML injection Inject 60 Units into the skin 2 (two) times daily.   insulin lispro (HUMALOG) 100 UNIT/ML injection Inject 10-20 Units into the skin See admin instructions. Inject 10-20 units subcutaneously up to 3 times daily - sliding scale is based on CBG   labetalol (NORMODYNE) 300 MG tablet Take 300 mg by mouth in the Patton and at bedtime.   lamoTRIgine (LAMICTAL) 200 MG tablet Take 200 mg by mouth 2 (two) times daily.   levocetirizine (XYZAL) 5 MG tablet Take 5 mg by mouth at bedtime.   losartan (COZAAR) 25 MG tablet Take 1 tablet (25 mg total) by mouth daily.   Olopatadine HCl (PATADAY OP) Place 1 drop into both eyes at bedtime. Extra strength   Omega-3 Fatty Acids (FISH OIL OMEGA-3) 1000 MG CAPS Take 1,000 mg by mouth at bedtime.   omeprazole (PRILOSEC) 20 MG capsule Take 20 mg by mouth every Patton.   sodium chloride (OCEAN) 0.65 % nasal spray Place 2 sprays into the nose at bedtime. Ayr   nitroGLYCERIN (NITROSTAT) 0.4 MG SL tablet Place 1 tablet (0.4 mg total) under the tongue every 5 (five) minutes as needed for chest pain.   No facility-administered encounter medications on file as of 04/23/2022.     Review of Systems  Review of Systems  N/a Physical Exam  BP 128/66 (BP Location: Left Arm, Patient Position: Sitting, Cuff Size: Normal)   Pulse 60   Ht 5\' 8"  (1.727 m)   Wt 267 lb (121.1 kg)   SpO2 95%   BMI 40.60 kg/m   Wt Readings from Last 5  Encounters:  04/23/22 267 lb (121.1 kg)  04/06/22 260 lb 6.4 oz (118.1 kg)  03/27/22 260 lb (117.9 kg)  03/20/22 260 lb (117.9 kg)  03/14/22 267 lb (121.1 kg)    BMI Readings from Last 5 Encounters:  04/23/22 40.60 kg/m  04/06/22 39.59 kg/m  03/27/22 39.53 kg/m  03/20/22 39.53 kg/m  03/14/22 40.60 kg/m     Physical Exam General: Sitting in chair, no acute distress Eyes: EOMI, no icterus Neck: Supple, no JVP appreciated sitting upright Pulmonary: Clear, normal work of breathing Cardiovascular: Warm, regular rate Abdomen: Distended, bowel sounds present MSK: No synovitis, no joint effusion Neuro: Normal gait, no weakness Psych: Normal mood, full affect   Assessment & Plan:   Dyspnea on exertion: Suspect largely driven by heart failure and diastolic dysfunction as well as deconditioning in the setting of musculoskeletal disease, arthritis.  As well as contribution from habitus as demonstrated by severely low ERV on PFT 04/2022.Marland Kitchen  Recent echocardiogram with left atrial dilation, moderate AI, mild MR, grade 1 diastolic dysfunction.  Left heart cath 03/20/2022 with LVEDP of 20.  PFTs normal 04/2022.  No improvement with albuterol.  Chest imaging clear.  Do not feel there is any pulmonary driver for dyspnea.  Encouraged ongoing cardiology follow-up.  Dry mouth, arthritis: Referral to rheumatology for both today.  OSA on CPAP: Diagnosed in [place]. Referred to sleep doctor for further evaluation,  ongoing CPAP needs.   Return if symptoms worsen or fail to improve.   Lanier Clam, MD 04/23/2022   This appointment required 40 minutes of patient care (this includes precharting, chart review, review of results, face-to-face care, etc.).

## 2022-04-23 NOTE — Patient Instructions (Signed)
Full PFT performed today. °

## 2022-04-23 NOTE — Progress Notes (Signed)
Full PFT performed today. °

## 2022-04-23 NOTE — Patient Instructions (Signed)
Nice to see you again  Your pulmonary function test are totally normal  I sent a referral to a rheumatologist to evaluate the dry mouth as well as your arthritis  I sent a referral to a sleep doctor to help with your CPAP needs in the future  Return to clinic with Dr. Silas Flood as needed

## 2022-04-26 ENCOUNTER — Other Ambulatory Visit: Payer: Self-pay

## 2022-04-26 DIAGNOSIS — I25118 Atherosclerotic heart disease of native coronary artery with other forms of angina pectoris: Secondary | ICD-10-CM

## 2022-04-26 MED ORDER — DILTIAZEM HCL ER COATED BEADS 120 MG PO CP24: 120.0000 mg | ORAL_CAPSULE | Freq: Every morning | ORAL | 3 refills | Status: DC

## 2022-04-27 ENCOUNTER — Telehealth: Payer: Self-pay | Admitting: *Deleted

## 2022-04-27 NOTE — Telephone Encounter (Signed)
Patient returning call to schedule new patient appt.

## 2022-05-04 DIAGNOSIS — M797 Fibromyalgia: Secondary | ICD-10-CM | POA: Diagnosis not present

## 2022-05-04 DIAGNOSIS — M792 Neuralgia and neuritis, unspecified: Secondary | ICD-10-CM | POA: Diagnosis not present

## 2022-05-04 DIAGNOSIS — M545 Low back pain, unspecified: Secondary | ICD-10-CM | POA: Diagnosis not present

## 2022-05-10 ENCOUNTER — Ambulatory Visit (INDEPENDENT_AMBULATORY_CARE_PROVIDER_SITE_OTHER): Payer: 59 | Admitting: Podiatry

## 2022-05-10 DIAGNOSIS — M79675 Pain in left toe(s): Secondary | ICD-10-CM | POA: Diagnosis not present

## 2022-05-10 DIAGNOSIS — M2042 Other hammer toe(s) (acquired), left foot: Secondary | ICD-10-CM

## 2022-05-10 DIAGNOSIS — B351 Tinea unguium: Secondary | ICD-10-CM | POA: Diagnosis not present

## 2022-05-10 DIAGNOSIS — M2031 Hallux varus (acquired), right foot: Secondary | ICD-10-CM

## 2022-05-10 DIAGNOSIS — M2041 Other hammer toe(s) (acquired), right foot: Secondary | ICD-10-CM

## 2022-05-10 DIAGNOSIS — M79674 Pain in right toe(s): Secondary | ICD-10-CM

## 2022-05-10 DIAGNOSIS — E1142 Type 2 diabetes mellitus with diabetic polyneuropathy: Secondary | ICD-10-CM | POA: Diagnosis not present

## 2022-05-10 NOTE — Progress Notes (Signed)
  Subjective:  Patient ID: Suzanne Patton, female    DOB: 28-May-1950,  MRN: [id number]  Chief Complaint  Patient presents with   Nail Problem    DFC./Evaluation  A1C-8.1    72 y.o. female presents with the above complaint. History confirmed with patient. Patient presenting with pain related to dystrophic thickened elongated nails. Patient is unable to trim own nails related to nail dystrophy and/or mobility issues. Patient does have a history of T2DM.  Patient hoping to get new diabetic shoes at this exam.  She does have a some numbness in both feet.  She is having difficulty trimming nails.  Objective:  Physical Exam: warm, good capillary refill nail exam onychomycosis of the toenails, onycholysis, and dystrophic nails DP pulses palpable, PT pulses palpable, and protective sensation absent Left Foot:  Pain with palpation of nails due to elongation and dystrophic growth. Hammertoe deformity of the lesser digits 2 through 5 Right Foot: Pain with palpation of nails due to elongation and dystrophic growth.  Hallux malleus of the right foot.  Hammertoe deformity of the lesser digits 2 through 5  Assessment:   1. Pain due to onychomycosis of toenails of both feet   2. Hallux malleus of right foot   3. Hammertoes of both feet   4. DM type 2 with diabetic peripheral neuropathy ([place])      Plan:  Patient was evaluated and treated and all questions answered.  # Type 2 diabetes with peripheral neuropathy # Hallux malleus right foot and hammertoes lesser digits bilateral Patient educated on diabetes. Discussed proper diabetic foot care and discussed risks and complications of disease. Educated patient in depth on reasons to return to the office immediately should he/she discover anything concerning or new on the feet. All questions answered. Discussed proper shoes as well.  Discussed diabetic shoes with the patient.  Believe she would benefit from pair of diabetic shoes and 3 pairs of diabetic  inserts to accommodate her foot structure offload the forefoot as she does have semirigid hallux malleus and hammertoe deformities on the right foot. -She will be placed on the casting schedule to be fitted for diabetic shoes and liners.  #Onychomycosis with pain  -Nails palliatively debrided as below. -Educated on self-care  Procedure: Nail Debridement Rationale: Pain Type of Debridement: manual, sharp debridement. Instrumentation: Nail nipper, rotary burr. Number of Nails: 10  Return in about 3 months (around 08/08/2022) for [facility].         Everitt Amber, DPM [facility]

## 2022-05-11 ENCOUNTER — Telehealth: Payer: Self-pay | Admitting: Pulmonary Disease

## 2022-05-11 NOTE — Telephone Encounter (Signed)
Pt. Wants to know from Dr. If its safe for her to fly or not

## 2022-05-11 NOTE — Telephone Encounter (Signed)
Called and spoke with patient. She stated that she wanted to know if it was safe for her to fly with her breathing issues. She last flew 3.5 years ago from [place] to [place] and was extremely sick from altitude sickness. She can had a can of "boost oxygen" with her but wasn't allowed to use it. The airline allowed her to use their oxygen until the plane landed. She is wanting to visit her daughter and will need to fly to get there. She denied having a history of blood clots.   Dr. Silas Flood, can you please advise? Thanks!

## 2022-05-15 NOTE — Telephone Encounter (Signed)
Called and left detailed voicemail for patient on [place] recommendations. Nothing further needed

## 2022-05-15 NOTE — Telephone Encounter (Signed)
Her recent PFT are normal. I see no reason to restrict her from [place]. Altitude sickness can occur but not due to her lungs with normal PFTs.

## 2022-05-17 ENCOUNTER — Ambulatory Visit: Payer: 59 | Admitting: Pulmonary Disease

## 2022-05-21 ENCOUNTER — Encounter: Payer: Self-pay | Admitting: Pulmonary Disease

## 2022-05-21 ENCOUNTER — Ambulatory Visit (INDEPENDENT_AMBULATORY_CARE_PROVIDER_SITE_OTHER): Payer: 59 | Admitting: Pulmonary Disease

## 2022-05-21 VITALS — BP 132/68 | HR 61 | Ht 67.25 in | Wt 267.6 lb

## 2022-05-21 DIAGNOSIS — G4733 Obstructive sleep apnea (adult) (pediatric): Secondary | ICD-10-CM | POA: Diagnosis not present

## 2022-05-21 NOTE — Addendum Note (Signed)
Addended by: June Leap on: 05/21/2022 02:33 PM   Modules accepted: Orders

## 2022-05-21 NOTE — Patient Instructions (Signed)
I will see you back in about 3 months  We will reach out to [place] to see if they can get [place] a download from your machine  Continue using your CPAP  DME referral for CPAP supplies -You can try a nasal mask again  You may want to take your CPAP into the [facility] to see if they can help you with the smartcard to make sure we can get a download from the machine

## 2022-05-21 NOTE — Progress Notes (Signed)
Suzanne Patton    GS:[id number]    June 08, 1950  Primary Care Physician:Collins, Hinton Dyer, DO  Referring Physician: [name], [place] [address],  [place] [id number]  Chief complaint:   Patient being seen for sleep apnea  HPI:  Patient being seen for history of sleep apnea Has used CPAP for many years from 2003  Multiple comorbidities, shortness of breath on exertion Musculoskeletal pain and discomfort Limited with shortness of breath on exertion -Evaluation so far has been unrevealing -Diastolic dysfunction on recent echo, -Left heart catheterization with nonobstructive coronary disease  Sleep study is not available -Has been using CPAP and compliant with CPAP  Usually goes to bed about 9 to 10:30 PM Falls asleep immediately About 1 awakening Wakes up between 930 and 11 AM  She does have dryness of her mouth in the morning  Uses a fullface mask Presents She feels she tolerated a nasal pillow better previously  She does have daytime fatigue which is multifactorial  Unable to tolerate any significant exertion  She does have a history of anxiety/depression Never smoked Was exposed to secondhand smoke growing up  She is a retired Therapist, sports  She stated she has had COVID at least 5 times with concern for long COVID  Outpatient Encounter Medications as of 05/21/2022  Medication Sig   albuterol (VENTOLIN HFA) 108 (90 Base) MCG/ACT inhaler Inhale 2 puffs into the lungs at bedtime as needed for wheezing or shortness of breath.   aspirin 81 MG EC tablet Take 81 mg by mouth at bedtime.   CALCIUM CITRATE PO Take 4 tablets by mouth at bedtime.   citalopram (CELEXA) 40 MG tablet Take 40 mg by mouth at bedtime.   clonazePAM (KLONOPIN) 0.5 MG tablet Take 1 mg by mouth at bedtime.   diclofenac Sodium (VOLTAREN) 1 % GEL Apply 2 g topically daily as needed (pain).   diltiazem (CARDIZEM CD) 120 MG 24 hr capsule Take 1 capsule (120 mg total) by mouth every morning.    ezetimibe (ZETIA) 10 MG tablet Take 10 mg by mouth every morning.   furosemide (LASIX) 40 MG tablet Take 1 tablet (40 mg total) by mouth daily.   GVOKE HYPOPEN 2-PACK 1 MG/0.2ML SOAJ Take 1 mg by mouth as needed (Low blood glucose).   HYDROcodone-acetaminophen (NORCO) 7.5-325 MG tablet Take 1 tablet by mouth every 6 (six) hours as needed for severe pain.   ibuprofen (ADVIL) 200 MG tablet Take 800 mg by mouth daily as needed for headache, mild pain or moderate pain (pain).   insulin detemir (LEVEMIR) 100 UNIT/ML injection Inject 60 Units into the skin 2 (two) times daily.   insulin lispro (HUMALOG) 100 UNIT/ML injection Inject 10-20 Units into the skin See admin instructions. Inject 10-20 units subcutaneously up to 3 times daily - sliding scale is based on CBG   labetalol (NORMODYNE) 300 MG tablet Take 300 mg by mouth in the morning and at bedtime.   lamoTRIgine (LAMICTAL) 200 MG tablet Take 200 mg by mouth 2 (two) times daily.   levocetirizine (XYZAL) 5 MG tablet Take 5 mg by mouth at bedtime.   losartan (COZAAR) 25 MG tablet Take 1 tablet (25 mg total) by mouth daily.   Olopatadine HCl (PATADAY OP) Place 1 drop into both eyes at bedtime. Extra strength   Omega-3 Fatty Acids (FISH OIL OMEGA-3) 1000 MG CAPS Take 1,000 mg by mouth at bedtime.   omeprazole (PRILOSEC) 20 MG capsule Take 20  mg by mouth every morning.   sodium chloride (OCEAN) 0.65 % nasal spray Place 2 sprays into the nose at bedtime. Ayr   nitroGLYCERIN (NITROSTAT) 0.4 MG SL tablet Place 1 tablet (0.4 mg total) under the tongue every 5 (five) minutes as needed for chest pain.   No facility-administered encounter medications on file as of 05/21/2022.    Allergies as of 05/21/2022 - Review Complete 05/21/2022  Allergen Reaction Noted   Dulaglutide Shortness Of Breath 04/11/2020   Semaglutide(0.25 or 0.'5mg'$ -dos) Nausea And Vomiting and Nausea Only 08/06/2021   Meclizine Other (See Comments) 05/28/2014   Other Other (See Comments)  05/28/2014   Prednisone Other (See Comments) 10/31/2020   Quinolones Other (See Comments) 10/31/2020    Past Medical History:  Diagnosis Date   Blood transfusion declined because patient is Jehovah's Witness    CAD (coronary artery disease)    Complication of anesthesia    Diabetes mellitus without complication ([place])    Hyperlipidemia    Hypertension    Neuromuscular disorder ([place])    PVD (peripheral vascular disease) ([place])    s/p carotid stent   Sleep apnea    Stage 3b chronic kidney disease (CKD) ([place])     Past Surgical History:  Procedure Laterality Date   ABLATION     CAROTID STENT     CHOLECYSTECTOMY     HYSTEROTOMY     INTRAVASCULAR PRESSURE WIRE/FFR STUDY N/A 03/20/2022   Procedure: INTRAVASCULAR PRESSURE WIRE/FFR STUDY;  Surgeon: Nigel Mormon, MD;  Location: [facility];  Service: Cardiovascular;  Laterality: N/A;   IRRIGATION AND DEBRIDEMENT ABSCESS N/A 08/06/2021   Procedure: IRRIGATION AND DEBRIDEMENT ABDOMINAL WALL  ABSCESS;  Surgeon: Ileana Roup, MD;  Location: [facility];  Service: General;  Laterality: N/A;   LEFT HEART CATH AND CORONARY ANGIOGRAPHY N/A 03/20/2022   Procedure: LEFT HEART CATH AND CORONARY ANGIOGRAPHY;  Surgeon: Nigel Mormon, MD;  Location: [facility];  Service: Cardiovascular;  Laterality: N/A;   ROTATOR CUFF REPAIR Right     Family History  Problem Relation Age of Onset   Atrial fibrillation Mother    Kidney failure Mother    Heart disease Father    Heart attack Father    Heart failure Father    COPD Father     Social History   Socioeconomic History   Marital status: Single    Spouse name: Not on file   Number of children: 3   Years of education: Not on file   Highest education level: Not on file  Occupational History   Occupation: retired psychiatry nurse  Tobacco Use   Smoking status: Never   Smokeless tobacco: Never  Vaping Use   Vaping Use: Never used  Substance and Sexual Activity    Alcohol use: Yes    Comment: rare   Drug use: Never   Sexual activity: Not on file  Other Topics Concern   Not on file  Social History Narrative   Not on file   Social Determinants of Health   Financial Resource Strain: Not on file  Food Insecurity: Not on file  Transportation Needs: Not on file  Physical Activity: Not on file  Stress: Not on file  Social Connections: Not on file  Intimate Partner Violence: Not on file    Review of Systems  Constitutional:  Positive for fatigue.  Respiratory:  Positive for apnea and shortness of breath.     Vitals:   05/21/22 1316  BP: 132/68  Pulse: 61  SpO2: 96%   Physical Exam Constitutional:      Appearance: She is obese.  HENT:     Head: Normocephalic.     Mouth/Throat:     Mouth: Mucous membranes are moist.  Eyes:     General: No scleral icterus.    Pupils: Pupils are equal, round, and reactive to light.  Cardiovascular:     Rate and Rhythm: Normal rate and regular rhythm.     Heart sounds: No murmur heard.    No friction rub.  Pulmonary:     Effort: No respiratory distress.     Breath sounds: No stridor. No wheezing or rhonchi.  Musculoskeletal:     Cervical back: No rigidity or tenderness.  Neurological:     Mental Status: She is alert.  Psychiatric:        Mood and Affect: Mood normal.       05/21/2022    1:00 PM  Results of the Epworth flowsheet  Sitting and reading 1  Watching TV 1  Sitting, inactive in a public place (e.g. a theatre or a meeting) 0  As a passenger in a car for an hour without a break 0  Lying down to rest in the afternoon when circumstances permit 1  Sitting and talking to someone 0  Sitting quietly after a lunch without alcohol 0  In a car, while stopped for a few minutes in traffic 0  Total score 3     Data Reviewed: Sleep study is not available to be reviewed  We do not have any downloads from the machine -Reached out to [place] to see if they can provide [place] with a  download  Dr. Kavin Leech notes reviewed  Assessment:  Obstructive sleep apnea -On CPAP therapy -Tolerate CPAP well -Not tolerating the current mask well enough  Shortness of breath on exertion  Deconditioning  Mood disorder  Stage IIIb chronic kidney disease  Plan/Recommendations:  Encouraged to continue using CPAP on a nightly basis  Will continue to reach out to [place] to see if they can get [place] a download from the machine  She may need to take the machine into [facility] to see a download can be obtained for [place]  Prescription for CPAP supplies to try a nasal pillow  Shortness of breath is multifactorial and may be related to just severe deconditioning, she does have severe musculoskeletal pains and discomfort as well  Will follow in about 3 months  Sherrilyn Rist MD [facility] 05/21/2022, 1:53 PM  CC: [name], DO

## 2022-05-22 ENCOUNTER — Ambulatory Visit: Payer: 59

## 2022-05-22 DIAGNOSIS — E1142 Type 2 diabetes mellitus with diabetic polyneuropathy: Secondary | ICD-10-CM

## 2022-05-22 DIAGNOSIS — M2041 Other hammer toe(s) (acquired), right foot: Secondary | ICD-10-CM

## 2022-06-14 NOTE — Progress Notes (Signed)
Patient presents to the office today for diabetic shoe and insole measuring.  Patient was measured with brannock device to determine size and width for 1 pair of extra depth shoes and foam casted for 3 pair of insoles.   ABN signed.   Documentation of medical necessity will be sent to patient's treating diabetic doctor to verify and sign.   Patient's diabetic provider: DR Docia Chuck and insoles will be ordered at that time and patient will be notified for an appointment for fitting when they arrive.   Brannock measurement: 11(LEFT)          8.5(RIGHT)  Patient shoe selection-   1st   Shoe choice:   [id number]  Shoe size ordered: 11

## 2022-07-03 ENCOUNTER — Encounter: Payer: Self-pay | Admitting: Pharmacist

## 2022-07-03 NOTE — Progress Notes (Signed)
[facility])     [facility] Quality Pharmacy Team Statin Quality Measure Assessment  07/03/2022  Suzanne Patton 07/18/1950 [id number]  Per review of chart and payor information, patient has a diagnosis of cardiovascular disease but is not currently filling a statin prescription. This places patient into the [facility] (Statin Use In Patients with Cardiovascular Disease) measure for CMS.    Rosuvastatin discontinued 02/22/2022.      Component Value Date/Time   CHOL 163 03/05/2022 1534   TRIG 182 (H) 03/05/2022 1534   HDL 75 03/05/2022 1534   CHOLHDL 2.2 03/05/2022 1534   LDLCALC 59 03/05/2022 1534    Please consider ONE of the following recommendations:  Initiate high intensity statin Atorvastatin  once daily, #90, 3 refills   Rosuvastatin  once daily, #90, 3 refills    Initiate moderate intensity  statin with reduced frequency if prior  statin intolerance 1x weekly, #13, 3 refills   2x weekly, #26, 3 refills   3x weekly, #39, 3 refills    Code for past statin intolerance  (required annually)  Provider Requirements: Must associate code during an office visit or telehealth encounter   Drug Induced Myopathy G72.0   Myalgia (SPC ONLY) M79.1   Myositis, unspecified M60.9   Myopathy, unspecified G72.9   Rhabdomyolysis M62.82   Thank you for allowing [facility] to be a part of this patient's care.  Reynold Bowen, PharmD [facility]  [facility] Clinical Pharmacist Direct Dial: [phone]

## 2022-07-05 ENCOUNTER — Ambulatory Visit: Payer: 59 | Admitting: Cardiology

## 2022-07-06 ENCOUNTER — Ambulatory Visit: Payer: 59 | Admitting: Cardiology

## 2022-07-09 ENCOUNTER — Telehealth: Payer: Self-pay | Admitting: Pulmonary Disease

## 2022-07-09 NOTE — Telephone Encounter (Signed)
Patient states needs order for new mask. Needs to be sent to [facility] in [facility]. Fax number is [phone]. Patient phone number is [phone].

## 2022-07-10 NOTE — Telephone Encounter (Signed)
Attempted to call pt but unable to reach. Left message for her to return call. 

## 2022-07-12 NOTE — Telephone Encounter (Signed)
Pt. Calling back to speak to nurse

## 2022-07-13 NOTE — Telephone Encounter (Signed)
ATC X1 LVM for patient to call the office back 

## 2022-07-15 ENCOUNTER — Emergency Department (HOSPITAL_COMMUNITY): Payer: 59

## 2022-07-15 ENCOUNTER — Other Ambulatory Visit: Payer: Self-pay

## 2022-07-15 ENCOUNTER — Emergency Department (HOSPITAL_COMMUNITY)
Admission: EM | Admit: 2022-07-15 | Discharge: 2022-07-15 | Disposition: A | Discharge status: Home / Self Care | Payer: 59 | Attending: Emergency Medicine | Admitting: Emergency Medicine

## 2022-07-15 DIAGNOSIS — N189 Chronic kidney disease, unspecified: Secondary | ICD-10-CM | POA: Insufficient documentation

## 2022-07-15 DIAGNOSIS — R519 Headache, unspecified: Secondary | ICD-10-CM | POA: Diagnosis not present

## 2022-07-15 DIAGNOSIS — G459 Transient cerebral ischemic attack, unspecified: Secondary | ICD-10-CM | POA: Diagnosis not present

## 2022-07-15 DIAGNOSIS — E1122 Type 2 diabetes mellitus with diabetic chronic kidney disease: Secondary | ICD-10-CM | POA: Diagnosis not present

## 2022-07-15 DIAGNOSIS — R42 Dizziness and giddiness: Secondary | ICD-10-CM | POA: Insufficient documentation

## 2022-07-15 DIAGNOSIS — I129 Hypertensive chronic kidney disease with stage 1 through stage 4 chronic kidney disease, or unspecified chronic kidney disease: Secondary | ICD-10-CM | POA: Insufficient documentation

## 2022-07-15 DIAGNOSIS — R270 Ataxia, unspecified: Secondary | ICD-10-CM

## 2022-07-15 LAB — CBC
HCT: 36.1 % (ref 36.0–46.0)
Hemoglobin: 11.1 g/dL — ABNORMAL LOW (ref 12.0–15.0)
MCH: 27.1 pg (ref 26.0–34.0)
MCHC: 30.7 g/dL (ref 30.0–36.0)
MCV: 88 fL (ref 80.0–100.0)
Platelets: 246 10*3/uL (ref 150–400)
RBC: 4.1 MIL/uL (ref 3.87–5.11)
RDW: 15.6 % — ABNORMAL HIGH (ref 11.5–15.5)
WBC: 5.2 10*3/uL (ref 4.0–10.5)
nRBC: 0 % (ref 0.0–0.2)

## 2022-07-15 LAB — BASIC METABOLIC PANEL
Anion gap: 12 (ref 5–15)
BUN: 23 mg/dL (ref 8–23)
CO2: 24 mmol/L (ref 22–32)
Calcium: 8.8 mg/dL — ABNORMAL LOW (ref 8.9–10.3)
Chloride: 101 mmol/L (ref 98–111)
Creatinine, Ser: 1.63 mg/dL — ABNORMAL HIGH (ref 0.44–1.00)
GFR, Estimated: 33 mL/min — ABNORMAL LOW (ref >60)
Glucose, Bld: 185 mg/dL — ABNORMAL HIGH (ref 70–99)
Potassium: 5 mmol/L (ref 3.5–5.1)
Sodium: 137 mmol/L (ref 135–145)

## 2022-07-15 LAB — MAGNESIUM: Magnesium: 2.2 mg/dL (ref 1.7–2.4)

## 2022-07-15 MED ORDER — DIPHENHYDRAMINE HCL 50 MG/ML IJ SOLN
25.0000 mg | Freq: Once | INTRAMUSCULAR | Status: AC
  Administered 2022-07-15: 25 mg via INTRAVENOUS
  Filled 2022-07-15: qty 1

## 2022-07-15 MED ORDER — SODIUM CHLORIDE 0.9 % IV BOLUS
500.0000 mL | Freq: Once | INTRAVENOUS | Status: AC
  Administered 2022-07-15: 500 mL via INTRAVENOUS

## 2022-07-15 MED ORDER — PROCHLORPERAZINE EDISYLATE 10 MG/2ML IJ SOLN
10.0000 mg | Freq: Once | INTRAMUSCULAR | Status: AC
  Administered 2022-07-15: 10 mg via INTRAVENOUS
  Filled 2022-07-15: qty 2

## 2022-07-15 NOTE — ED Provider Notes (Signed)
[place] EMERGENCY DEPARTMENT AT [facility] Provider Note   CSN: [id number] Arrival date & time: 07/15/22  1241     History  Chief Complaint  Patient presents with   Aphasia    Slurred speech    Suzanne Patton is a 72 y.o. female.  HPI     Pt is a 72 y/o female with a pmhx significant for DM, HLD, HTN, CKD, CAD, PVD, ambulatory dysfunction (uses a walker) and sleep apnea to the emergency room with chief complaint of headache, balance issues, confusion.  Patient states that she had COVID 5 or 6 times.  She indicates that her headaches have been more constant and severe ever since she had COVID.  Headaches are pretty much a daily occurrence for her, and more recently they are fairly constant.  There is no specific evoking, aggravating or relieving factors.  Pain is not positional. Pt has no associated nausea, vomiting, seizures, loss of consciousness or new visual complaints associated with this headache.  She states that in general she has balance issues.  She has good weeks and bad weeks.  Her balance issues described as dizziness and unsteady gait as a result of it.  Patient does not use a walker at all time.  She has recurrent falls because of her balance issues.  Patient denies any ringing in her ear.  She denies any vertiginous symptoms.  There is no dizziness or lightheadedness when patient is laying down.  Patient also complains of confusion.  Daughter is at the bedside.  Patient states that she has good weeks and bad weeks with confusion as well.  She has difficulty concentrating and remembering things per daughter.  Confusion per daughter is also not new, but appears to be slowly progressing.  Patient had a conversation with her friend, who used to be an ICU nurse, and she encouraged patient to come to the emergency room as patient sounded worse compared to her baseline to her.  Patient has a PCP, but does not think she has discussed all of these findings with her.   She sees multiple specialist including cardiologist, pulmonologist, rheumatologist, endocrinologist.  She has never seen a neurologist for her symptoms.  She used to see an eye specialist for vision but has lost follow-up with them.   Home Medications Prior to Admission medications   Medication Sig Start Date End Date Taking? Authorizing Provider  albuterol (VENTOLIN HFA) 108 (90 Base) MCG/ACT inhaler Inhale 2 puffs into the lungs at bedtime as needed for wheezing or shortness of breath. 08/16/20   [provider]  aspirin 81 MG EC tablet Take 81 mg by mouth at bedtime.    [provider]  CALCIUM CITRATE PO Take 4 tablets by mouth at bedtime.    [provider]  citalopram (CELEXA) 40 MG tablet Take 40 mg by mouth at bedtime. 03/01/20   [provider]  clonazePAM (KLONOPIN) 0.5 MG tablet Take 1 mg by mouth at bedtime. 04/30/20   [provider]  diclofenac Sodium (VOLTAREN) 1 % GEL Apply 2 g topically daily as needed (pain).    [provider]  diltiazem (CARDIZEM CD) 120 MG 24 hr capsule Take 1 capsule (120 mg total) by mouth every morning. 04/26/22   Patwardhan, Anabel Bene, MD  ezetimibe (ZETIA) 10 MG tablet Take 10 mg by mouth every morning. 06/23/20   [provider]  furosemide (LASIX) 40 MG tablet Take 1 tablet (40 mg total) by mouth daily. 04/06/22 04/01/23  Patwardhan, Manish J, MD  GVOKE HYPOPEN 2-PACK 1 MG/0.2ML SOAJ Take 1 mg by mouth as needed (Low blood glucose).    [provider]  HYDROcodone-acetaminophen (NORCO) 7.5-325 MG tablet Take 1 tablet by mouth every 6 (six) hours as needed for severe pain.    [provider]  ibuprofen (ADVIL) 200 MG tablet Take 800 mg by mouth daily as needed for headache, mild pain or moderate pain (pain).    [provider]  insulin detemir (LEVEMIR) 100 UNIT/ML injection Inject 60 Units into the skin 2 (two) times daily.    [provider]  insulin lispro  (HUMALOG) 100 UNIT/ML injection Inject 10-20 Units into the skin See admin instructions. Inject 10-20 units subcutaneously up to 3 times daily - sliding scale is based on CBG 01/16/20   [provider]  labetalol (NORMODYNE) 300 MG tablet Take 300 mg by mouth in the morning and at bedtime.    [provider]  lamoTRIgine (LAMICTAL) 200 MG tablet Take 200 mg by mouth 2 (two) times daily. 06/27/20   [provider]  levocetirizine (XYZAL) 5 MG tablet Take 5 mg by mouth at bedtime.    [provider]  losartan (COZAAR) 25 MG tablet Take 1 tablet (25 mg total) by mouth daily. 03/30/22 09/26/22  Patwardhan, Anabel Bene, MD  nitroGLYCERIN (NITROSTAT) 0.4 MG SL tablet Place 1 tablet (0.4 mg total) under the tongue every 5 (five) minutes as needed for chest pain. 07/25/20 04/06/22  Patwardhan, Anabel Bene, MD  Olopatadine HCl (PATADAY OP) Place 1 drop into both eyes at bedtime. Extra strength    [provider]  Omega-3 Fatty Acids (FISH OIL OMEGA-3) 1000 MG CAPS Take 1,000 mg by mouth at bedtime.    [provider]  omeprazole (PRILOSEC) 20 MG capsule Take 20 mg by mouth every morning. 06/23/20   [provider]  sodium chloride (OCEAN) 0.65 % nasal spray Place 2 sprays into the nose at bedtime. Ayr    [provider]      Allergies    Dulaglutide, Semaglutide(0.25 or 0.5mg -dos), Meclizine, Other, Prednisone, and Quinolones    Review of Systems   Review of Systems  All other systems reviewed and are negative.   Physical Exam Updated Vital Signs BP (!) 142/64   Pulse (!) 58   Temp 97.9 F (36.6 C) (Oral)   Resp 15   Ht 5' 7.25" (1.708 m)   Wt 121.4 kg   SpO2 92%   BMI 41.61 kg/m  Physical Exam Vitals and nursing note reviewed.  Constitutional:      Appearance: She is well-developed.  HENT:     Head: Atraumatic.  Eyes:     Extraocular Movements: Extraocular movements intact.     Pupils: Pupils are equal, round, and reactive to  light.  Neck:     Comments: No meningismus Cardiovascular:     Rate and Rhythm: Normal rate.  Pulmonary:     Effort: Pulmonary effort is normal.  Musculoskeletal:     Cervical back: Normal range of motion and neck supple.  Skin:    General: Skin is warm and dry.  Neurological:     Mental Status: She is alert and oriented to person, place, and time.     Cranial Nerves: No cranial nerve deficit.     Sensory: No sensory deficit.     Motor: No weakness.     Coordination: Coordination normal.     ED Results / Procedures / Treatments  Labs (all labs ordered are listed, but only abnormal results are displayed) Labs Reviewed  BASIC METABOLIC PANEL - Abnormal; Notable for the following components:      Result Value   Glucose, Bld 185 (*)    Creatinine, Ser 1.63 (*)    Calcium 8.8 (*)    GFR, Estimated 33 (*)    All other components within normal limits  CBC - Abnormal; Notable for the following components:   Hemoglobin 11.1 (*)    RDW 15.6 (*)    All other components within normal limits  MAGNESIUM  URINALYSIS, ROUTINE W REFLEX MICROSCOPIC  CBG MONITORING, ED    EKG EKG Interpretation  Date/Time:  Sunday July 15 2022 12:49:39 EDT Ventricular Rate:  66 PR Interval:  212 QRS Duration: 92 QT Interval:  464 QTC Calculation: 486 R Axis:   -28 Text Interpretation: Sinus rhythm with 1st degree A-V block Otherwise normal ECG When compared with ECG of 20-Mar-2022 08:51, PREVIOUS ECG IS PRESENT No acute changes No significant change since last tracing Confirmed by Derwood Kaplan ([id number]) on 07/15/2022 4:12:40 PM  Radiology CT Head Wo Contrast  Result Date: 07/15/2022 CLINICAL DATA:  Transient ischemic attack. EXAM: CT HEAD WITHOUT CONTRAST TECHNIQUE: Contiguous axial images were obtained from the base of the skull through the vertex without intravenous contrast. RADIATION DOSE REDUCTION: This exam was performed according to the departmental dose-optimization program which  includes automated exposure control, adjustment of the mA and/or kV according to patient size and/or use of iterative reconstruction technique. COMPARISON:  October 13, 2021 FINDINGS: Brain: There is mild cerebral atrophy with widening of the extra-axial spaces and ventricular dilatation. There are areas of decreased attenuation within the white matter tracts of the supratentorial brain, consistent with microvascular disease changes. Vascular: There is marked severity bilateral cavernous carotid artery calcification. Skull: Normal. Negative for fracture or focal lesion. Sinuses/Orbits: No acute finding. Other: None. IMPRESSION: 1. No acute intracranial abnormality. 2. Generalized cerebral atrophy and microvascular disease changes of the supratentorial brain. Electronically Signed   By: Aram Candela M.D.   On: 07/15/2022 13:56    Procedures Procedures    Medications Ordered in ED Medications  prochlorperazine (COMPAZINE) injection 10 mg (10 mg Intravenous Given 07/15/22 1535)  diphenhydrAMINE (BENADRYL) injection 25 mg (25 mg Intravenous Given 07/15/22 1535)  sodium chloride 0.9 % bolus 500 mL (500 mLs Intravenous New Bag/Given 07/15/22 1523)    ED Course/ Medical Decision Making/ A&P                             Medical Decision Making Amount and/or Complexity of Data Reviewed Labs: ordered.  Risk Prescription drug management.   72 year old patient comes in with multiple complaints, there appeared to be acute on chronic.  She is indicating chronic headaches that have become more regular, balance issues that is sometimes worse than other times, and worsening confusion.  History provided by patient.  History also provided by patient's daughter who is at the bedside.  I have reviewed patient's previous workup including discharge summary from last year.  Differential diagnosis considered for this patient include: Primary headaches - including migrainous headaches, cluster headaches, tension  headaches. ICH, Carotid dissection, Cavernous sinus thrombosis /dural venous thrombosis, Tumor, Vascular headaches / Brain aneurysm, Muscular headaches, IIH leading to ataxia, electrolyte abnormality leading to ataxia.  Prior to me seeing the patient, she already had a CT scan of the brain completed.  CT scan of the brain  independently interpreted, no evidence of brain bleed.  She also had basic labs drawn, which are reassuring.  Plan is for patient to get a headache cocktail and then we will reassess her.  If she still having headache, then I think will be worthwhile getting a CT venogram while in the emergency room.  If her headache is improved, then we will have patient follow-up with neurologist in the outpatient setting for optimal management.  4:36 PM Patient reassessed.  She states that her headache was 8 or 9 out of 10 when I first saw her, now it is down to 2 out of 10.  She feels a lot better.  I will put in an ambulatory referral for neurology service. Request will be for them to try and give patient a primary diagnosis for the headache and potentially rule out pathology such as venous thrombosis.  Patient might need evaluation for dementia given the waxing and waning nature to her confusion.  She also might need basic workup to make sure there is no deficiencies leading to ataxia.  Final Clinical Impression(s) / ED Diagnoses Final diagnoses:  Generalized headache  Dizziness  Ataxia    Rx / DC Orders ED Discharge Orders          Ordered    Ambulatory referral to Neurology       Comments: An appointment is requested in approximately: 2 weeks   07/15/22 1627              Derwood Kaplan, MD 07/15/22 [phone]

## 2022-07-15 NOTE — ED Triage Notes (Addendum)
PT states her friend told her that she has been "talking funny" this week and "walking backwards".  Pt states stomach bug the week before that has since resolved, though pt is complaining of a headache. Pt took 600 ibuprofen at 1100 am.  Speech clear in triage.

## 2022-07-15 NOTE — Discharge Instructions (Addendum)
We saw you in the ER for headaches. All the labs and imaging are normal.  We are not sure what is causing your headaches, however, there appears to be no evidence of infection, bleeds or tumors based on our exam and results.  Please see the Neurologist and Primary Care Doctor for further care. I have sent a Neurologist referral on your behalf.  Please also follow-up with your eye specialist for vision changes.

## 2022-07-17 DIAGNOSIS — E7800 Pure hypercholesterolemia, unspecified: Secondary | ICD-10-CM | POA: Diagnosis not present

## 2022-07-17 DIAGNOSIS — E1159 Type 2 diabetes mellitus with other circulatory complications: Secondary | ICD-10-CM | POA: Diagnosis not present

## 2022-07-17 DIAGNOSIS — E1129 Type 2 diabetes mellitus with other diabetic kidney complication: Secondary | ICD-10-CM | POA: Diagnosis not present

## 2022-07-20 ENCOUNTER — Telehealth: Payer: Self-pay

## 2022-07-20 NOTE — Telephone Encounter (Signed)
Transition Care Management Unsuccessful Follow-up Telephone Call  Date of discharge and from where:  07/15/2022, [facility]  Attempts:  1st Attempt  Reason for unsuccessful TCM follow-up call:  Left voice message  Lennette Fader [facility]  [facility] [place] Population Health Community Resource Care Guide   [email or web address]  ?? [phone]   Website: [email or web address]  [email or web address]

## 2022-07-23 ENCOUNTER — Telehealth: Payer: Self-pay

## 2022-07-23 NOTE — Telephone Encounter (Signed)
Transition Care Management Follow-up Telephone Call Date of discharge and from where: 07/15/2022 [facility] How have you been since you were released from the hospital? Patient stated she if feeling a little better. Any questions or concerns? No  Items Reviewed: Did the pt receive and understand the discharge instructions provided? Yes  Medications obtained and verified? Yes  Other? No  Any new allergies since your discharge? No  Dietary orders reviewed? Yes Do you have support at home? No   Follow up appointments reviewed:  PCP Hospital f/u appt confirmed?  Patient is planning on making an appointment with PCP.  Scheduled to see  on @ . Specialist Hospital f/u appt confirmed? No  Scheduled to see  on  @ . Are transportation arrangements needed? No  If their condition worsens, is the pt aware to call PCP or go to the Emergency Dept.? Yes Was the patient provided with contact information for the PCP's office or ED? Yes Was to pt encouraged to call back with questions or concerns? Yes  Orbin Mayeux [facility]  [facility] Community Resource Care Guide   ??[email or web address]  ?? [phone]   Website: [email or web address]  [email or web address]

## 2022-07-25 ENCOUNTER — Ambulatory Visit: Payer: 59 | Admitting: Cardiology

## 2022-07-25 DIAGNOSIS — M545 Low back pain, unspecified: Secondary | ICD-10-CM | POA: Diagnosis not present

## 2022-08-03 ENCOUNTER — Encounter: Payer: 59 | Admitting: Internal Medicine

## 2022-08-09 ENCOUNTER — Ambulatory Visit: Payer: 59 | Attending: Internal Medicine | Admitting: Internal Medicine

## 2022-08-09 ENCOUNTER — Ambulatory Visit (INDEPENDENT_AMBULATORY_CARE_PROVIDER_SITE_OTHER): Payer: 59 | Admitting: Podiatry

## 2022-08-09 ENCOUNTER — Encounter: Payer: Self-pay | Admitting: Internal Medicine

## 2022-08-09 VITALS — BP 146/78 | HR 64 | Resp 16 | Ht 68.5 in | Wt 264.0 lb

## 2022-08-09 DIAGNOSIS — B351 Tinea unguium: Secondary | ICD-10-CM | POA: Diagnosis not present

## 2022-08-09 DIAGNOSIS — E1142 Type 2 diabetes mellitus with diabetic polyneuropathy: Secondary | ICD-10-CM

## 2022-08-09 DIAGNOSIS — M79675 Pain in left toe(s): Secondary | ICD-10-CM

## 2022-08-09 DIAGNOSIS — M130 Polyarthritis, unspecified: Secondary | ICD-10-CM

## 2022-08-09 DIAGNOSIS — R682 Dry mouth, unspecified: Secondary | ICD-10-CM

## 2022-08-09 DIAGNOSIS — M79674 Pain in right toe(s): Secondary | ICD-10-CM | POA: Diagnosis not present

## 2022-08-09 NOTE — Progress Notes (Signed)
  Subjective:  Patient ID: Suzanne Patton, female    DOB: 1951-02-12,  MRN: [id number]  Chief Complaint  Patient presents with   Nail Problem    Diabetic Foot Care     72 y.o. female presents with the above complaint. History confirmed with patient. Patient presenting with pain related to dystrophic thickened elongated nails. Patient is unable to trim own nails related to nail dystrophy and/or mobility issues. Patient does have a history of T2DM.  Patient hoping to get new diabetic shoes at this exam.  She does have a some numbness in both feet.  She is having difficulty trimming nails.  Objective:  Physical Exam: warm, good capillary refill nail exam onychomycosis of the toenails, onycholysis, and dystrophic nails DP pulses palpable, PT pulses palpable, and protective sensation absent Left Foot:  Pain with palpation of nails due to elongation and dystrophic growth. Hammertoe deformity of the lesser digits 2 through 5 Right Foot: Pain with palpation of nails due to elongation and dystrophic growth.  Hallux malleus of the right foot.  Hammertoe deformity of the lesser digits 2 through 5  Assessment:   1. Pain due to onychomycosis of toenails of both feet   2. DM type 2 with diabetic peripheral neuropathy (HCC)       Plan:  Patient was evaluated and treated and all questions answered.  #Onychomycosis with pain  -Nails palliatively debrided as below. -Educated on self-care  Procedure: Nail Debridement Rationale: Pain Type of Debridement: manual, sharp debridement. Instrumentation: Nail nipper, rotary burr. Number of Nails: 10  Return in about 3 months (around 11/09/2022) for [facility].         Corinna Gab, DPM [facility] / [facility]

## 2022-08-09 NOTE — Progress Notes (Signed)
Office Visit Note  Patient: Suzanne Patton             Date of Birth: 1950-08-18           MRN: [id number]             PCP: Irena Reichmann, DO Referring: Karren Burly, MD Visit Date: 08/09/2022  Subjective:  New Patient (Initial Visit) (Patient states she fell about four months ago and landed on the back of her neck. Patient states it has caused shoulder pain and give her headaches and dizziness. Patient states she also have back pain and knee pain. )   History of Present Illness: Suzanne Patton is a 72 y.o. female here for evaluation of joint pain in multiple areas also associated with chronic dry mouth symptoms complaint.  She is being evaluated in pulmonology clinic where continued dyspnea on exertion thought to be worsened by contribution of deconditioning in the setting of chronic musculoskeletal pain.  She also has a history of fibromyalgia syndrome and chronic neuropathic pain for which she sees Dr. Drucie Ip.  Recent treatment including addition of 500 mg Robaxin but avoiding any steroid treatments due to some episodes of severe hyperglycemia over 600 after previous injections.  Also has not recommended long-acting neuropathic pain medication or anticonvulsants due to ongoing fall history.  She last had a major fall about 4 months ago and landed across the shoulders at around base of the neck that caused subsequent pain in the area as well as headaches and dizziness symptoms.  She chronically has low back and bilateral knee pain that is frequently worse with prolonged standing and walking.  She gets lower extremity swelling related to congestive heart failure but does not see a lot of swelling localized to specific joints. Chronic dry eyes and mouth have been going on for years.  No acute worsening.  Does not have complications such as recurrent infections, corneal abrasions, or frequent mouth sores and ulcers.    Activities of Daily Living:  Patient reports morning stiffness for 0 minute.    Patient Reports nocturnal pain.  Difficulty dressing/grooming: Reports Difficulty climbing stairs: Reports Difficulty getting out of chair: Reports Difficulty using hands for taps, buttons, cutlery, and/or writing: Reports  Review of Systems  Constitutional:  Positive for fatigue.  HENT:  Positive for mouth sores and mouth dryness.   Eyes:  Positive for dryness.  Respiratory:  Positive for shortness of breath.   Cardiovascular:  Positive for chest pain and palpitations.  Gastrointestinal:  Positive for constipation and diarrhea. Negative for blood in stool.  Endocrine: Positive for increased urination.  Genitourinary:  Negative for involuntary urination.  Musculoskeletal:  Positive for joint pain, gait problem, joint pain, joint swelling, myalgias, muscle weakness, muscle tenderness and myalgias. Negative for morning stiffness.  Skin:  Positive for rash. Negative for color change, hair loss and sensitivity to sunlight.  Allergic/Immunologic: Negative for susceptible to infections.  Neurological:  Positive for dizziness and headaches.  Hematological:  Negative for swollen glands.  Psychiatric/Behavioral:  Positive for depressed mood and sleep disturbance. The patient is nervous/anxious.     PMFS History:  Patient Active Problem List   Diagnosis Date Noted   Dry mouth 08/09/2022   Polyarthritis 08/09/2022   Chronic heart failure with preserved ejection fraction (HCC) 04/07/2022   Palpitations 02/24/2022   Moderate mitral regurgitation 02/24/2022   Exertional dyspnea 09/25/2021   Abdominal wall abscess 08/06/2021   Stage 3b chronic kidney disease (CKD) (HCC) 08/06/2021   Mood disorder (  HCC) 08/06/2021   OSA (obstructive sleep apnea) 02/01/2021   Diabetic macular edema of both eyes with severe nonproliferative retinopathy associated with type 2 diabetes mellitus (HCC) 02/01/2021   Severe nonproliferative diabetic retinopathy of right eye, with macular edema, associated with type 2  diabetes mellitus (HCC) 02/01/2021   Severe nonproliferative diabetic retinopathy of left eye, with macular edema, associated with type 2 diabetes mellitus (HCC) 02/01/2021   Mixed hyperlipidemia 07/25/2020   Essential hypertension 07/25/2020   Coronary artery disease 07/25/2020   Syncope and collapse 07/25/2020    Past Medical History:  Diagnosis Date   Blood transfusion declined because patient is Jehovah's Witness    CAD (coronary artery disease)    Complication of anesthesia    Degenerative joint disease    Diabetes mellitus without complication (HCC)    Fibromyalgia    Hyperlipidemia    Hypertension    Neuromuscular disorder (HCC)    Osteoarthritis    PVD (peripheral vascular disease) (HCC)    s/p carotid stent   Sleep apnea    Stage 3b chronic kidney disease (CKD) (HCC)     Family History  Problem Relation Age of Onset   Atrial fibrillation Mother    Kidney failure Mother    Heart disease Father    Heart attack Father    Heart failure Father    COPD Father    Past Surgical History:  Procedure Laterality Date   ABLATION     CAROTID STENT     CHOLECYSTECTOMY     CORONARY PRESSURE/FFR STUDY N/A 03/20/2022   Procedure: INTRAVASCULAR PRESSURE WIRE/FFR STUDY;  Surgeon: Elder Negus, MD;  Location: [facility] INVASIVE [facility];  Service: Cardiovascular;  Laterality: N/A;   HYSTEROTOMY     IRRIGATION AND DEBRIDEMENT ABSCESS N/A 08/06/2021   Procedure: IRRIGATION AND DEBRIDEMENT ABDOMINAL WALL  ABSCESS;  Surgeon: Andria Meuse, MD;  Location: [facility] OR;  Service: General;  Laterality: N/A;   LEFT HEART CATH AND CORONARY ANGIOGRAPHY N/A 03/20/2022   Procedure: LEFT HEART CATH AND CORONARY ANGIOGRAPHY;  Surgeon: Elder Negus, MD;  Location: [facility] IN[facility] CV [facility];  Service: Cardiovascular;  Laterality: N/A;   ROTATOR CUFF REPAIR Right    Social History   Social History Narrative   Not on file    There is no immunization history on file for this patient.    Objective: Vital Signs: BP (!) 146/78 (BP Location: Right Arm, Patient Position: Sitting, Cuff Size: Normal)   Pulse 64   Resp 16   Ht 5' 8.5" (1.74 m)   Wt 264 lb (119.7 kg)   BMI 39.56 kg/m    Physical Exam Constitutional:      Appearance: She is obese.  HENT:     Mouth/Throat:     Mouth: Mucous membranes are moist.     Pharynx: Oropharynx is clear.  Eyes:     Conjunctiva/sclera: Conjunctivae normal.  Cardiovascular:     Rate and Rhythm: Normal rate and regular rhythm.  Pulmonary:     Effort: Pulmonary effort is normal.     Breath sounds: Normal breath sounds.  Lymphadenopathy:     Cervical: No cervical adenopathy.  Skin:    General: Skin is warm and dry.     Findings: No rash.  Neurological:     Mental Status: She is alert.  Psychiatric:        Mood and Affect: Mood normal.      Musculoskeletal Exam:  Tenderness to pressure in wide distribution throughout back, shoulders,  upper arms, and legs, ROM of joints grossly intact and without palpable effusions Fingers full ROM, grip strength intact, no palpable synovitis Knee joint line tenderness to palpation, no effusions   Investigation: No additional findings.  Imaging: No results found.  Recent Labs: Lab Results  Component Value Date   WBC 5.2 07/15/2022   HGB 11.1 (L) 07/15/2022   PLT 246 07/15/2022   NA 137 07/15/2022   K 5.0 07/15/2022   CL 101 07/15/2022   CO2 24 07/15/2022   GLUCOSE 185 (H) 07/15/2022   BUN 23 07/15/2022   CREATININE 1.63 (H) 07/15/2022   BILITOT 0.8 10/13/2021   ALKPHOS 72 10/13/2021   AST 21 10/13/2021   ALT 16 10/13/2021   PROT 6.6 10/13/2021   ALBUMIN 3.7 10/13/2021   CALCIUM 8.8 (L) 07/15/2022    Speciality Comments: No specialty comments available.  Procedures:  No procedures performed Allergies: Dulaglutide, Semaglutide(0.25 or 0.5mg -dos), Meclizine, Other, Prednisone, and Quinolones   Assessment / Plan:     Visit Diagnoses: Polyarthritis - Plan: ANA,  Sjogrens syndrome-A extractable nuclear antibody, Sjogrens syndrome-B extractable nuclear antibody, C3 and C4, Rheumatoid factor, Sedimentation rate, Anti-DNA antibody, double-stranded, RNP Antibody, Anti-Smith antibody  Exam is nonspecific she has very widespread pain most consistent with chronic pain syndrome such as fibromyalgia. Also not typical for alternate cause such as sjogrens with arthralgias or small fiber neuropathy. Will check antibody panel and serum inflammatory markers to rule out underlying cause to the extent possible. Also recommend patient to check out online fibromyalgia patient guide on self management strategies.  Dry mouth  Discussed dry mouth could be associated to inflammatory condition such as Sjogren syndrome but she also has some alternative potential causes including incompletely controlled type 2 diabetes, diuretics for congestive heart failure, or even possible related to her neurologic and psychiatric medication. Supportive treatments recommended for now no new prescription medication.  Orders: Orders Placed This Encounter  Procedures   ANA   Sjogrens syndrome-A extractable nuclear antibody   Sjogrens syndrome-B extractable nuclear antibody   C3 and C4   Rheumatoid factor   Sedimentation rate   Anti-DNA antibody, double-stranded   RNP Antibody   Anti-Smith antibody   No orders of the defined types were placed in this encounter.   Follow-Up Instructions: Return in about 4 weeks (around 09/06/2022) for New pt arthritis/dryness ?sjogren f/u 18mo.   Fuller Plan, MD  Note - This record has been created using [employer].  Chart creation errors have been sought, but may not always  have been located. Such creation errors do not reflect on  the standard of medical care.

## 2022-08-09 NOTE — Patient Instructions (Signed)
I recommend checking out the [employer] patient-centered guide for fibromyalgia and chronic pain management: [email or web address]  

## 2022-08-10 ENCOUNTER — Ambulatory Visit: Payer: 59 | Admitting: Cardiology

## 2022-08-10 ENCOUNTER — Encounter: Payer: Self-pay | Admitting: Cardiology

## 2022-08-10 VITALS — BP 141/63 | HR 65 | Resp 16 | Ht 68.0 in | Wt 267.0 lb

## 2022-08-10 DIAGNOSIS — I10 Essential (primary) hypertension: Secondary | ICD-10-CM | POA: Diagnosis not present

## 2022-08-10 DIAGNOSIS — I5022 Chronic systolic (congestive) heart failure: Secondary | ICD-10-CM | POA: Diagnosis not present

## 2022-08-10 DIAGNOSIS — G4733 Obstructive sleep apnea (adult) (pediatric): Secondary | ICD-10-CM | POA: Diagnosis not present

## 2022-08-10 DIAGNOSIS — I25118 Atherosclerotic heart disease of native coronary artery with other forms of angina pectoris: Secondary | ICD-10-CM

## 2022-08-10 LAB — SEDIMENTATION RATE: Sed Rate: 14 mm/h (ref 0–30)

## 2022-08-10 LAB — SJOGRENS SYNDROME-A EXTRACTABLE NUCLEAR ANTIBODY: SSA (Ro) (ENA) Antibody, IgG: <1 AI

## 2022-08-10 LAB — RHEUMATOID FACTOR: Rheumatoid fact SerPl-aCnc: 10 IU/mL (ref <14)

## 2022-08-10 MED ORDER — SEMAGLUTIDE(0.25 OR 0.5MG/DOS) 2 MG/3ML ~~LOC~~ SOPN
0.2500 mg | PEN_INJECTOR | SUBCUTANEOUS | 2 refills | Status: DC
Start: 2022-08-10 — End: 2022-11-07

## 2022-08-10 MED ORDER — ROSUVASTATIN CALCIUM 20 MG PO TABS: 20.0000 mg | ORAL_TABLET | Freq: Every day | ORAL | 3 refills | Status: AC

## 2022-08-10 NOTE — Progress Notes (Signed)
Patient referred by Irena Reichmann, DO for coronary artery disease  Subjective:   Suzanne Patton, female    DOB: [birth year], 72 y.o.   MRN: [id number]   Chief Complaint  Patient presents with   Chronic heart failure with preserved ejection fraction    Essential hypertension   Coronary artery disease of native artery of native heart wi   Follow-up    3 months     HPI  72 y.o. Caucasian female with hypertension, hyperlipidemia, type 2 DM, CAD, SVT, bipolar disorder, OSA on CPAP, morbid obesity  Patient is doing fairly well from cardiac standpoint, denies any significant chest pain, has unchanged mild exertional dyspnea. She has palpitations from time to time lasting for 3-4 min at a time. She is being worked up for headache and possible complex migraine, had one ER visit with confusion.   She is frustrated by inability to lose weight in spite of making dietary and lifestyle adjustments. She has tried some injectables in the past but had nausea. She had bot been strict about dietary restrictions then.   Initial consultation HPI 07/2020: Patient has known CAD with PCI in 2012, has h/o SVT with ablations, all performed in [place].   Patient moved to [place] in July 2020 to be with her daughter.  Most recently, she lived in [place], although she has previously lived in several different states.  Her last visit with her cardiologist in [place] was in September 2020.  Patient has had left-sided chest pressure symptoms with physical and emotional stress, relieved with stress, for long time.  This had been stable, but increased this past winter.  Patient has 2-3 episodes a week.  She is not on any short or long-acting nitroglycerin.  Patient reports episodes of lightheadedness followed by syncope, most recent episode 3 weeks ago.  She has several other episodes of aborted syncope.  She denies any chest pain or shortness of breath prior to her syncope episode.  She is reportedly undergone  extensive neurological work-up in [place], which was reportedly unyielding.  Her diabetes remains uncontrolled.  She is now established care with Dr. Irena Reichmann, with whom she is working actively on management of her diabetes.   Current Outpatient Medications:    albuterol (VENTOLIN HFA) 108 (90 Base) MCG/ACT inhaler, Inhale 2 puffs into the lungs at bedtime as needed for wheezing or shortness of breath., Disp: , Rfl:    aspirin 81 MG EC tablet, Take 81 mg by mouth at bedtime., Disp: , Rfl:    Blood Glucose Monitoring Suppl (ONE TOUCH ULTRA 2) w/Device KIT, daily., Disp: , Rfl:    CALCIUM CITRATE PO, Take 4 tablets by mouth at bedtime., Disp: , Rfl:    citalopram (CELEXA) 40 MG tablet, Take 40 mg by mouth at bedtime., Disp: , Rfl:    clonazePAM (KLONOPIN) 0.5 MG tablet, Take 1 mg by mouth at bedtime. (Patient not taking: Reported on 08/09/2022), Disp: , Rfl:    clonazePAM (KLONOPIN) 1 MG tablet, Take 1 mg by mouth at bedtime., Disp: , Rfl:    diclofenac Sodium (VOLTAREN) 1 % GEL, Apply 2 g topically daily as needed (pain)., Disp: , Rfl:    diltiazem (CARDIZEM CD) 120 MG 24 hr capsule, Take 1 capsule (120 mg total) by mouth every morning., Disp: 90 capsule, Rfl: 3   ezetimibe (ZETIA) 10 MG tablet, Take 10 mg by mouth every morning., Disp: , Rfl:    furosemide (LASIX) 40 MG tablet, Take 1 tablet (40 mg  total) by mouth daily., Disp: 90 tablet, Rfl: 3   GVOKE HYPOPEN 2-PACK 1 MG/0.2ML SOAJ, Take 1 mg by mouth as needed (Low blood glucose)., Disp: , Rfl:    HYDROcodone-acetaminophen (NORCO) 7.5-325 MG tablet, Take 1 tablet by mouth every 6 (six) hours as needed for severe pain., Disp: , Rfl:    ibuprofen (ADVIL) 200 MG tablet, Take 800 mg by mouth daily as needed for headache, mild pain or moderate pain (pain)., Disp: , Rfl:    insulin detemir (LEVEMIR) 100 UNIT/ML injection, Inject 60 Units into the skin 2 (two) times daily., Disp: , Rfl:    insulin lispro (HUMALOG) 100 UNIT/ML injection, Inject  10-20 Units into the skin See admin instructions. Inject 10-20 units subcutaneously up to 3 times daily - sliding scale is based on CBG, Disp: , Rfl:    labetalol (NORMODYNE) 300 MG tablet, Take 300 mg by mouth in the morning and at bedtime., Disp: , Rfl:    lamoTRIgine (LAMICTAL) 200 MG tablet, Take 200 mg by mouth 2 (two) times daily., Disp: , Rfl:    Lancets (ONETOUCH ULTRASOFT) lancets, TEST 3 TIMES A DAY AS DIRECTED, Disp: , Rfl:    levocetirizine (XYZAL) 5 MG tablet, Take 5 mg by mouth at bedtime., Disp: , Rfl:    losartan (COZAAR) 25 MG tablet, Take 1 tablet (25 mg total) by mouth daily., Disp: 90 tablet, Rfl: 1   methocarbamol (ROBAXIN) 500 MG tablet, Take 500 mg by mouth daily as needed., Disp: , Rfl:    nitroGLYCERIN (NITROSTAT) 0.4 MG SL tablet, Place 1 tablet (0.4 mg total) under the tongue every 5 (five) minutes as needed for chest pain., Disp: 30 tablet, Rfl: 3   Olopatadine HCl (PATADAY OP), Place 1 drop into both eyes at bedtime. Extra strength, Disp: , Rfl:    Omega-3 Fatty Acids (FISH OIL OMEGA-3) 1000 MG CAPS, Take 1,000 mg by mouth at bedtime., Disp: , Rfl:    omeprazole (PRILOSEC) 20 MG capsule, Take 20 mg by mouth every morning., Disp: , Rfl:    ONETOUCH ULTRA test strip, 3 (three) times daily. for testing, Disp: , Rfl:    sodium chloride (OCEAN) 0.65 % nasal spray, Place 2 sprays into the nose at bedtime. [place] (Patient not taking: Reported on 08/09/2022), Disp: , Rfl:   Cardiovascular and other pertinent studies:  EKG 07/16/2022: Sinus rhythm 68 bpm First degree AV block  Coronary angiography 03/20/2022: LM: Normal LAD: Prox-mid 40% disease         Diag 1 40% disease Lcx: Long diffuse 70% stenosis in prox-mid Lcx        RFR 0.93        70% long disease in OM1 RCA: Patent prox-mid RCA stents with minimal 10% lumen loss   LVEDP 20 mmHg   Obesity and possible diastolic heart failure most likely cause of patient's exertional dyspnea symptoms  Lexiscan Tetrofosmin stress  test 09/05/2020: Lexiscan nuclear stress test performed using 1-day protocol. Patient reported 2/10 chest pain, dyspnea, and dizziness. SPECT images show small sized, mild intensity, reversible perfusion defect in basal anteroseptal myocardium. Stress LVEF is calculated 49%, but visually appears normal. Low risk study.  Echocardiogram 09/05/2020:  Left ventricle cavity is normal in size. Moderate concentric hypertrophy  of the left ventricle. Normal global wall motion. Normal LV systolic  function with EF 60%. Normal diastolic filling pattern.  Structurally normal trileaflet aortic valve. No evidence of aortic  stenosis. Moderate (Grade II) aortic regurgitation.  Moderate (Grade II) mitral  regurgitation.  Mild tricuspid regurgitation. Estimated pulmonary artery systolic pressure  26 mmHg.  Mild pulmonic regurgitation.  Mobile cardiac telemetry 13 days 07/25/2020 - 08/08/2020: Dominant rhythm: Sinus. HR 58-150 bpm. Avg HR 71 bpm. 37 episodes of SVT, fastest at 126 bpm for 9 beats, longest for 12 beats at 105 bpm. <1% isolated SVE, couplet/triplets. 1 episode of VT, at 150 bpm for 4 beats <1% isolated VE, no couplet/triplets. No atrial fibrillation/atrial flutter/high grade AV block, sinus pause >3sec noted. 34 patient triggered events, correlated with sinus rhythm without any arrhthymias.  Recent labs: 03/05/2022: Chol 163, TG 182, HDL 75, LDL 59  10/13/2021: Glucose 130, BUN/Cr 22/1.30. EGFR 44. Na/K 140/4.4. Rest of the CMP normal H/H 12/38. MCV 88. Platelets 214 Trop HS 6,7  05/30/2021: Glucose 194, BUN/Cr 20/1.52. EGFR 39. Na/K 141/4.9. Rest of the CMP normal H/H 12/37. MCV 88. Platelets 236 HbA1C 7.2% Chol 158, TG 117, HDL 71, LDL 64  06/17/2020: Glucose 161, BUN/Cr 19/1.3. EGFR 44. Na/K 141/5.2. AlKP 132. Rest of the CMP normal H/H 13/41. MCV 87. Platelets 250 HbA1C 8.0% Chol 268, TG 181, HDL 81, LDL 155 TSH 1.5 normal    Review of Systems  Cardiovascular:  Positive for  dyspnea on exertion (Mild, stable). Negative for chest pain, leg swelling, palpitations and syncope (No recurrence).  Neurological:  Negative for light-headedness.         Vitals:   08/10/22 1132 08/10/22 1139  BP: (!) 146/55 (!) 141/63  Pulse: 64 65  Resp: 16   SpO2: 94%      Body mass index is 40.6 kg/m. Filed Weights   08/10/22 1132  Weight: 267 lb (121.1 kg)     Objective:   Physical Exam Vitals and nursing note reviewed.  Constitutional:      General: She is not in acute distress.    Appearance: She is obese.  Neck:     Vascular: No JVD.  Cardiovascular:     Rate and Rhythm: Normal rate and regular rhythm.     Heart sounds: Normal heart sounds. No murmur heard. Pulmonary:     Effort: Pulmonary effort is normal.     Breath sounds: Normal breath sounds. No wheezing or rales.  Musculoskeletal:     Right lower leg: No edema.     Left lower leg: No edema.  Neurological:     Mental Status: She is alert.         Assessment & Recommendations:   72 y.o. Caucasian female with hypertension, hyperlipidemia, type 2 DM, CAD, SVT, bipolar disorder, OSA on CPAP, morbid obesity  Exertional dyspnea: Likely due to HFpEF. Continue lasix 40 mg daily. Do not recommend Jardiance at this time due to renal dysfunction.  PSVT: Fairly well controlled on metoprolol and diltiazem. Discussed vagal maneuvers.  Obesity: BMI 40, CAD< DM, Added Ozempic 0.25 mg weekly. Discussed dietary restrictions including portion control, avoiding fatty food to avoid having side effects. She is open to giving it another try.   Hypertension: Well-controlled  Mixed hyperlipidemia: Had been off statins. Refilled Crestor 20 mg daily, which has controlled lipids very well in the past.   F/u in 4 weeks    Elder Negus, MD Pager: [phone] Office: [phone]

## 2022-08-13 LAB — ANTI-DNA ANTIBODY, DOUBLE-STRANDED: ds DNA Ab: 6 IU/mL — ABNORMAL HIGH

## 2022-08-13 LAB — C3 AND C4
C3 Complement: 144 mg/dL (ref 83–193)
C4 Complement: 38 mg/dL (ref 15–57)

## 2022-08-13 LAB — RNP ANTIBODY: Ribonucleic Protein(ENA) Antibody, IgG: <1 AI

## 2022-08-13 LAB — ANTI-SMITH ANTIBODY: ENA SM Ab Ser-aCnc: <1 AI

## 2022-08-13 LAB — ANA: Anti Nuclear Antibody (ANA): NEGATIVE

## 2022-08-13 LAB — SJOGRENS SYNDROME-B EXTRACTABLE NUCLEAR ANTIBODY: SSB (La) (ENA) Antibody, IgG: <1 AI

## 2022-08-21 NOTE — Progress Notes (Unsigned)
[facility]  PATIENT: Suzanne Patton DOB: 08-Feb-1951  REFERRING DOCTOR OR PCP: Irena Reichmann, DO SOURCE: Patient, note from emergency room, imaging and lab reports, CT scan personally reviewed.  _________________________________   HISTORICAL  CHIEF COMPLAINT:  No chief complaint on file.   HISTORY OF PRESENT ILLNESS:  I had the pleasure seeing a patient, Suzanne Patton, at [facility] for neurologic consultation regarding her complaints of headache, gait disturbance and confusion  She is a 72 year old woman who reports lots of problems going on over the last 30 years and having a lot of tests.    She rambled on so I tried to get her to focus on her main problems.  She reports a headache and dizziness are the worse problem.  She reports her HA's are daily.   They are mostly at the vertex, often with pain radiation up form the occiput.   She used ibuprofen and [employer] but only gets a couple hours benefit.  She denies N/V with headaches though sometimes notes vision seems worse.   She reports ome photophobia but not phonophobia.    Moving her head sometimes worsens the pain.   She denies being on prophylactic medications   She also has neck pain and uses Voltaren gel or NSAIDs without much benefit.   She reports having imaging at [facility] but we do not have acces to their studies.  We do have a spine CT form 2023 after a fall showing DJD, including at C2C3 -- right facet hypertrophy and right foraminal narrowing  She has vertigo that is not always associated with movements.  This has been present x years but worsening in general.   She notes it most when laying down.  Hearing, however, is symmstric.  She has some tinnitus on the left now and then. She also sees Dr. Dimple Casey (Rheumatology)  She has arthritis in neck, knees and shoulders.   She has IDDM Type 2 x 24 years  Her daughter has reported that she is confused but she denies any significant cognitive  issues.   She was able to answer questions appropriately.   She is on lamotrigine, citalopram for bipolar.     Imaging CT 07/15/2022 showed brain  fairly normal for age with minimal atrophy and chronic microvascular ischemic changes.   Cervical spine showed  There is disc space narrowing and degenerative endplate change most advanced at C4-C5 at C6-C7. There is overall mild facet arthropathy most advanced on the right at C2-C3. There is no high-grade spinal canal stenosis though likely mild  at C4C5 and C6C7    REVIEW OF SYSTEMS: Constitutional: No fevers, chills, sweats, or change in appetite Eyes: No visual changes, double vision, eye pain Ear, nose and throat: No hearing loss, ear pain, nasal congestion, sore throat Cardiovascular: No chest pain, palpitations Respiratory:  No shortness of breath at rest or with exertion.   No wheezes GastrointestinaI: No nausea, vomiting, diarrhea, abdominal pain, fecal incontinence Genitourinary:  No dysuria, urinary retention or frequency.  No nocturia. Musculoskeletal: Neck pain as above.  She also has some back pain.   Integumentary: No rash, pruritus, skin lesions Neurological: as above Psychiatric: she has bipolar disease. Endocrine: No palpitations, diaphoresis, change in appetite, change in weigh or increased thirst Hematologic/Lymphatic:  No anemia, purpura, petechiae. Allergic/Immunologic: No itchy/runny eyes, nasal congestion, recent allergic reactions, rashes  ALLERGIES: Allergies  Allergen Reactions   Dulaglutide Shortness Of Breath    Other Reaction(s): respiratory distress  Semaglutide(0.25 Or 0.5mg -Dos) Nausea And Vomiting and Nausea Only   Meclizine Other (See Comments)    Psychotic episode   Other Other (See Comments)    A- blood type diet  Low fat, Healthy heart   Prednisone Other (See Comments)    hyperglycemia   Quinolones Other (See Comments)    severe muscle aches    HOME MEDICATIONS:  Current Outpatient  Medications:    albuterol (VENTOLIN HFA) 108 (90 Base) MCG/ACT inhaler, Inhale 2 puffs into the lungs at bedtime as needed for wheezing or shortness of breath. (Patient not taking: Reported on 08/10/2022), Disp: , Rfl:    aspirin 81 MG EC tablet, Take 81 mg by mouth at bedtime., Disp: , Rfl:    Blood Glucose Monitoring Suppl (ONE TOUCH ULTRA 2) w/Device KIT, daily., Disp: , Rfl:    CALCIUM CITRATE PO, Take 4 tablets by mouth at bedtime., Disp: , Rfl:    citalopram (CELEXA) 40 MG tablet, Take 40 mg by mouth at bedtime., Disp: , Rfl:    clonazePAM (KLONOPIN) 0.5 MG tablet, Take 1 mg by mouth at bedtime., Disp: , Rfl:    clonazePAM (KLONOPIN) 1 MG tablet, Take 1 mg by mouth at bedtime., Disp: , Rfl:    diclofenac Sodium (VOLTAREN) 1 % GEL, Apply 2 g topically daily as needed (pain)., Disp: , Rfl:    diltiazem (CARDIZEM CD) 120 MG 24 hr capsule, Take 1 capsule (120 mg total) by mouth every morning., Disp: 90 capsule, Rfl: 3   ezetimibe (ZETIA) 10 MG tablet, Take 10 mg by mouth every morning., Disp: , Rfl:    furosemide (LASIX) 40 MG tablet, Take 1 tablet (40 mg total) by mouth daily., Disp: 90 tablet, Rfl: 3   GVOKE HYPOPEN 2-PACK 1 MG/0.2ML SOAJ, Take 1 mg by mouth as needed (Low blood glucose)., Disp: , Rfl:    HYDROcodone-acetaminophen (NORCO) 7.5-325 MG tablet, Take 1 tablet by mouth every 6 (six) hours as needed for severe pain., Disp: , Rfl:    ibuprofen (ADVIL) 200 MG tablet, Take 800 mg by mouth daily as needed for headache, mild pain or moderate pain (pain)., Disp: , Rfl:    insulin detemir (LEVEMIR) 100 UNIT/ML injection, Inject 60 Units into the skin 2 (two) times daily., Disp: , Rfl:    insulin lispro (HUMALOG) 100 UNIT/ML injection, Inject 10-20 Units into the skin See admin instructions. Inject 10-20 units subcutaneously up to 3 times daily - sliding scale is based on CBG, Disp: , Rfl:    labetalol (NORMODYNE) 300 MG tablet, Take 300 mg by mouth in the morning and at bedtime., Disp: , Rfl:     lamoTRIgine (LAMICTAL) 200 MG tablet, Take 200 mg by mouth 2 (two) times daily., Disp: , Rfl:    Lancets (ONETOUCH ULTRASOFT) lancets, TEST 3 TIMES A DAY AS DIRECTED, Disp: , Rfl:    levocetirizine (XYZAL) 5 MG tablet, Take 5 mg by mouth at bedtime., Disp: , Rfl:    losartan (COZAAR) 25 MG tablet, Take 1 tablet (25 mg total) by mouth daily., Disp: 90 tablet, Rfl: 1   methocarbamol (ROBAXIN) 500 MG tablet, Take 500 mg by mouth daily as needed. (Patient not taking: Reported on 08/10/2022), Disp: , Rfl:    nitroGLYCERIN (NITROSTAT) 0.4 MG SL tablet, Place 1 tablet (0.4 mg total) under the tongue every 5 (five) minutes as needed for chest pain., Disp: 30 tablet, Rfl: 3   Olopatadine HCl (PATADAY OP), Place 1 drop into both eyes at bedtime. Extra strength,  Disp: , Rfl:    Omega-3 Fatty Acids (FISH OIL OMEGA-3) 1000 MG CAPS, Take 1,000 mg by mouth at bedtime., Disp: , Rfl:    omeprazole (PRILOSEC) 20 MG capsule, Take 20 mg by mouth every morning., Disp: , Rfl:    ONETOUCH ULTRA test strip, 3 (three) times daily. for testing, Disp: , Rfl:    rosuvastatin (CRESTOR) 20 MG tablet, Take 1 tablet (20 mg total) by mouth daily., Disp: 90 tablet, Rfl: 3   Semaglutide,0.25 or 0.5MG /DOS, 2 MG/3ML SOPN, Inject 0.25 mg into the skin once a week., Disp: 3 mL, Rfl: 2   sodium chloride (OCEAN) 0.65 % nasal spray, Place 2 sprays into the nose at bedtime. [place] (Patient not taking: Reported on 08/09/2022), Disp: , Rfl:   PAST MEDICAL HISTORY: Past Medical History:  Diagnosis Date   Blood transfusion declined because patient is Jehovah's Witness    CAD (coronary artery disease)    Complication of anesthesia    Degenerative joint disease    Diabetes mellitus without complication (HCC)    Fibromyalgia    Hyperlipidemia    Hypertension    Neuromuscular disorder (HCC)    Osteoarthritis    PVD (peripheral vascular disease) (HCC)    s/p carotid stent   Sleep apnea    Stage 3b chronic kidney disease (CKD) (HCC)      PAST SURGICAL HISTORY: Past Surgical History:  Procedure Laterality Date   ABLATION     CAROTID STENT     CHOLECYSTECTOMY     CORONARY PRESSURE/FFR STUDY N/A 03/20/2022   Procedure: INTRAVASCULAR PRESSURE WIRE/FFR STUDY;  Surgeon: Elder Negus, MD;  Location: [facility];  Service: Cardiovascular;  Laterality: N/A;   HYSTEROTOMY     IRRIGATION AND DEBRIDEMENT ABSCESS N/A 08/06/2021   Procedure: IRRIGATION AND DEBRIDEMENT ABDOMINAL WALL  ABSCESS;  Surgeon: Andria Meuse, MD;  Location: [facility] OR;  Service: General;  Laterality: N/A;   LEFT HEART CATH AND CORONARY ANGIOGRAPHY N/A 03/20/2022   Procedure: LEFT HEART CATH AND CORONARY ANGIOGRAPHY;  Surgeon: Elder Negus, MD;  Location: [facility];  Service: Cardiovascular;  Laterality: N/A;   ROTATOR CUFF REPAIR Right     FAMILY HISTORY: Family History  Problem Relation Age of Onset   Atrial fibrillation Mother    Kidney failure Mother    Heart disease Father    Heart attack Father    Heart failure Father    COPD Father     SOCIAL HISTORY: Social History   Socioeconomic History   Marital status: Single    Spouse name: Not on file   Number of children: 3   Years of education: Not on file   Highest education level: Not on file  Occupational History   Occupation: retired psychiatry nurse  Tobacco Use   Smoking status: Never   Smokeless tobacco: Never  Vaping Use   Vaping Use: Never used  Substance and Sexual Activity   Alcohol use: Yes    Comment: rare   Drug use: Never   Sexual activity: Not on file  Other Topics Concern   Not on file  Social History Narrative   Not on file   Social Determinants of Health   Financial Resource Strain: Not on file  Food Insecurity: Not on file  Transportation Needs: Not on file  Physical Activity: Not on file  Stress: Not on file  Social Connections: Not on file  Intimate Partner Violence: Not on file  PHYSICAL EXAM  There were no  vitals filed for this visit.  There is no height or weight on file to calculate BMI.   General: The patient is well-developed and well-nourished and in no acute distress  HEENT:  Head is [place]/AT.  Sclera are anicteric.    Neck: No carotid bruits are noted.  The neck is nontender.  Cardiovascular: The heart has a regular rate and rhythm with a normal S1 and S2. There were no murmurs, gallops or rubs.    Skin: Extremities are without rash or  edema.  Musculoskeletal:  Back is nontender  Neurologic Exam  Mental status: The patient is alert and oriented x 3 at the time of the examination. The patient has apparent normal recent and remote memory, with an apparently normal attention span and concentration ability.   Speech is normal.  Cranial nerves: Extraocular movements are full. Pupils are equal, round, and reactive to light and accomodation.  Visual fields are full.  Facial symmetry is present. There is good facial sensation to soft touch bilaterally.Facial strength is normal.  Trapezius and sternocleidomastoid strength is normal. No dysarthria is noted.  The tongue is midline, and the patient has symmetric elevation of the soft palate. No obvious hearing deficits are noted.  The Weber did not lateralize.  Motor:  Muscle bulk is normal.   Tone is normal. Strength is  5 / 5 in all 4 extremities.   Sensory: Sensory testing is intact to pinprick, soft touch and vibration sensation in the arms except mild dorsal 3rd finger numbness).  In legs, she had mild vibration sensation loss at left knee and moderate in right foot and moderate to severe in right foot (0 toes and 20% ankles).  Pinprick wa reduced in both feet, right slightly more than left.  Coordination: Cerebellar testing reveals good finger-nose-finger and heel-to-shin bilaterally.  Gait and station: Station is normal.   Gait is mildly arthritic. Tandem gait is mildly wide. Romberg is negative.   Reflexes: Deep tendon reflexes are  symmetric and 2 in the arms, 1 at the knees and trace at the ankles bilaterally.   Plantar responses are flexor.    DIAGNOSTIC DATA (LABS, IMAGING, TESTING) - I reviewed patient records, labs, notes, testing and imaging myself where available.  Lab Results  Component Value Date   WBC 5.2 07/15/2022   HGB 11.1 (L) 07/15/2022   HCT 36.1 07/15/2022   MCV 88.0 07/15/2022   PLT 246 07/15/2022      Component Value Date/Time   NA 137 07/15/2022 1304   NA 136 03/14/2022 1527   K 5.0 07/15/2022 1304   CL 101 07/15/2022 1304   CO2 24 07/15/2022 1304   GLUCOSE 185 (H) 07/15/2022 1304   BUN 23 07/15/2022 1304   BUN 28 (H) 03/14/2022 1527   CREATININE 1.63 (H) 07/15/2022 1304   CALCIUM 8.8 (L) 07/15/2022 1304   PROT 6.6 10/13/2021 0820   ALBUMIN 3.7 10/13/2021 0820   AST 21 10/13/2021 0820   ALT 16 10/13/2021 0820   ALKPHOS 72 10/13/2021 0820   BILITOT 0.8 10/13/2021 0820   GFRNONAA 33 (L) 07/15/2022 1304   Lab Results  Component Value Date   CHOL 163 03/05/2022   HDL 75 03/05/2022   LDLCALC 59 03/05/2022   TRIG 182 (H) 03/05/2022   CHOLHDL 2.2 03/05/2022   Lab Results  Component Value Date   HGBA1C 8.7 (H) 03/05/2022      ASSESSMENT AND PLAN  Occipital headache - Plan: MR  CERVICAL SPINE WO CONTRAST  Cervical facet joint syndrome - Plan: MR CERVICAL SPINE WO CONTRAST  Cervical stenosis of spinal canal - Plan: MR CERVICAL SPINE WO CONTRAST  Diabetic polyneuropathy associated with type 2 diabetes mellitus (HCC)  Vertigo - Plan: MR BRAIN/IAC W WO CONTRAST  Left-sided tinnitus - Plan: MR BRAIN/IAC W WO CONTRAST   In summary, Ms. Casco is a 72 year old woman who reports multiple symptoms.  Her most bothersome symptoms are occipital headache and vertigo.  She has degenerative changes in the spine including facet hypertrophy to the right at C2-C3.  This could potentially cause occipital neuralgia type pain.  The numbness in her feet is likely due to diabetic  polyneuropathy.  However, since it is asymmetric there could also be superimposed radiculopathy.  Bilateral splenius capitis trigger point injections with a total of 5 cc Marcaine containing 30 mg Depo-Medrol (dose reduced as diabetic) using sterile technique.  She tolerated the procedure well and pain was better afterwards. MRI of the cervical spine as she appears to have significant degenerative changes with spinal stenosis on CAT scan.  We need to further evaluate the possibility of significant spinal stenosis or foraminal narrowing that may not showing up on the CAT scan.  Add nortriptyline 10 mg 1:59 at night to help with the headache pain Origin of her vertigo is unclear.  She reports it has been present for at least a few years and has fluctuated some.  She also notes tinnitus in the left ear.  I will check an MRI of the brain with added thin cuts through the internal auditory canals to further evaluate to determine if there is a schwannoma or other etiology. Stay active and exercise as tolerated. Return in 4 months or sooner for new or worsening neurologic symptoms.   Marajade Lei A. Epimenio Foot, MD, [facility] 08/21/2022, 8:39 PM Certified in Neurology, Clinical Neurophysiology, Sleep Medicine and Neuroimaging  [facility] [address] [place], [place] [address] [phone]

## 2022-08-22 ENCOUNTER — Encounter: Payer: Self-pay | Admitting: Neurology

## 2022-08-22 ENCOUNTER — Telehealth: Payer: Self-pay | Admitting: Neurology

## 2022-08-22 ENCOUNTER — Ambulatory Visit (INDEPENDENT_AMBULATORY_CARE_PROVIDER_SITE_OTHER): Payer: 59 | Admitting: Neurology

## 2022-08-22 VITALS — BP 136/66 | HR 81 | Ht 68.0 in | Wt 262.5 lb

## 2022-08-22 DIAGNOSIS — R519 Headache, unspecified: Secondary | ICD-10-CM

## 2022-08-22 DIAGNOSIS — M4802 Spinal stenosis, cervical region: Secondary | ICD-10-CM | POA: Diagnosis not present

## 2022-08-22 DIAGNOSIS — E1142 Type 2 diabetes mellitus with diabetic polyneuropathy: Secondary | ICD-10-CM

## 2022-08-22 DIAGNOSIS — Z7985 Long-term (current) use of injectable non-insulin antidiabetic drugs: Secondary | ICD-10-CM | POA: Diagnosis not present

## 2022-08-22 DIAGNOSIS — M47812 Spondylosis without myelopathy or radiculopathy, cervical region: Secondary | ICD-10-CM

## 2022-08-22 DIAGNOSIS — H9312 Tinnitus, left ear: Secondary | ICD-10-CM | POA: Diagnosis not present

## 2022-08-22 DIAGNOSIS — R42 Dizziness and giddiness: Secondary | ICD-10-CM | POA: Diagnosis not present

## 2022-08-22 MED ORDER — NORTRIPTYLINE HCL 10 MG PO CAPS
ORAL_CAPSULE | ORAL | 5 refills | Status: DC
Start: 2022-08-22 — End: 2023-02-06

## 2022-08-22 NOTE — Telephone Encounter (Signed)
[employer] medicare NPR sent to GI [phone] 

## 2022-09-09 NOTE — Progress Notes (Unsigned)
Office Visit Note  Patient: Suzanne Patton             Date of Birth: 1950-12-11           MRN: [id number]             PCP: Irena Reichmann, DO Referring: Irena Reichmann, DO Visit Date: 09/10/2022   Subjective:  No chief complaint on file.   History of Present Illness: Suzanne Patton is a 72 y.o. female here for follow up ***   Previous HPI 08/09/22 Suzanne Patton is a 72 y.o. female here for evaluation of joint pain in multiple areas also associated with chronic dry mouth symptoms complaint.  She is being evaluated in pulmonology clinic where continued dyspnea on exertion thought to be worsened by contribution of deconditioning in the setting of chronic musculoskeletal pain.  She also has a history of fibromyalgia syndrome and chronic neuropathic pain for which she sees Dr. Drucie Ip.  Recent treatment including addition of 500 mg Robaxin but avoiding any steroid treatments due to some episodes of severe hyperglycemia over 600 after previous injections.  Also has not recommended long-acting neuropathic pain medication or anticonvulsants due to ongoing fall history.  She last had a major fall about 4 months ago and landed across the shoulders at around base of the neck that caused subsequent pain in the area as well as headaches and dizziness symptoms.  She chronically has low back and bilateral knee pain that is frequently worse with prolonged standing and walking.  She gets lower extremity swelling related to congestive heart failure but does not see a lot of swelling localized to specific joints. Chronic dry eyes and mouth have been going on for years.  No acute worsening.  Does not have complications such as recurrent infections, corneal abrasions, or frequent mouth sores and ulcers.   No Rheumatology ROS completed.   PMFS History:  Patient Active Problem List   Diagnosis Date Noted   Dry mouth 08/09/2022   Polyarthritis 08/09/2022   Chronic heart failure with preserved ejection fraction (HCC)  04/07/2022   Palpitations 02/24/2022   Moderate mitral regurgitation 02/24/2022   Exertional dyspnea 09/25/2021   Abdominal wall abscess 08/06/2021   Stage 3b chronic kidney disease (CKD) (HCC) 08/06/2021   Mood disorder (HCC) 08/06/2021   OSA (obstructive sleep apnea) 02/01/2021   Diabetic macular edema of both eyes with severe nonproliferative retinopathy associated with type 2 diabetes mellitus (HCC) 02/01/2021   Severe nonproliferative diabetic retinopathy of right eye, with macular edema, associated with type 2 diabetes mellitus (HCC) 02/01/2021   Severe nonproliferative diabetic retinopathy of left eye, with macular edema, associated with type 2 diabetes mellitus (HCC) 02/01/2021   Mixed hyperlipidemia 07/25/2020   Essential hypertension 07/25/2020   Coronary artery disease 07/25/2020   Syncope and collapse 07/25/2020    Past Medical History:  Diagnosis Date   Blood transfusion declined because patient is Jehovah's Witness    CAD (coronary artery disease)    Complication of anesthesia    Degenerative joint disease    Diabetes mellitus without complication (HCC)    Fibromyalgia    Hyperlipidemia    Hypertension    Neuromuscular disorder (HCC)    Osteoarthritis    PVD (peripheral vascular disease) (HCC)    s/p carotid stent   Sleep apnea    Stage 3b chronic kidney disease (CKD) (HCC)     Family History  Problem Relation Age of Onset   Atrial fibrillation Mother    Kidney failure Mother  Heart disease Father    Heart attack Father    Heart failure Father    COPD Father    Past Surgical History:  Procedure Laterality Date   ABLATION     CAROTID STENT     CHOLECYSTECTOMY     CORONARY PRESSURE/FFR STUDY N/A 03/20/2022   Procedure: INTRAVASCULAR PRESSURE WIRE/FFR STUDY;  Surgeon: Elder Negus, MD;  Location: [facility];  Service: Cardiovascular;  Laterality: N/A;   HYSTEROTOMY     IRRIGATION AND DEBRIDEMENT ABSCESS N/A 08/06/2021   Procedure:  IRRIGATION AND DEBRIDEMENT ABDOMINAL WALL  ABSCESS;  Surgeon: Andria Meuse, MD;  Location: [facility] OR;  Service: General;  Laterality: N/A;   LEFT HEART CATH AND CORONARY ANGIOGRAPHY N/A 03/20/2022   Procedure: LEFT HEART CATH AND CORONARY ANGIOGRAPHY;  Surgeon: Elder Negus, MD;  Location: [facility];  Service: Cardiovascular;  Laterality: N/A;   ROTATOR CUFF REPAIR Right    Social History   Social History Narrative   Right handed   Wears prescription glasses    Drinks coffee daily (2 cups)    There is no immunization history on file for this patient.   Objective: Vital Signs: There were no vitals taken for this visit.   Physical Exam   Musculoskeletal Exam: ***  CDAI Exam: CDAI Score: -- Patient Global: --; Provider Global: -- Swollen: --; Tender: -- Joint Exam 09/10/2022   No joint exam has been documented for this visit   There is currently no information documented on the homunculus. Go to the Rheumatology activity and complete the homunculus joint exam.  Investigation: No additional findings.  Imaging: No results found.  Recent Labs: Lab Results  Component Value Date   WBC 5.2 07/15/2022   HGB 11.1 (L) 07/15/2022   PLT 246 07/15/2022   NA 137 07/15/2022   K 5.0 07/15/2022   CL 101 07/15/2022   CO2 24 07/15/2022   GLUCOSE 185 (H) 07/15/2022   BUN 23 07/15/2022   CREATININE 1.63 (H) 07/15/2022   BILITOT 0.8 10/13/2021   ALKPHOS 72 10/13/2021   AST 21 10/13/2021   ALT 16 10/13/2021   PROT 6.6 10/13/2021   ALBUMIN 3.7 10/13/2021   CALCIUM 8.8 (L) 07/15/2022    Speciality Comments: No specialty comments available.  Procedures:  No procedures performed Allergies: Dulaglutide, Semaglutide(0.25 or 0.5mg -dos), Meclizine, Other, Prednisone, and Quinolones   Assessment / Plan:     Visit Diagnoses: No diagnosis found.  ***  Orders: No orders of the defined types were placed in this encounter.  No orders of the defined types were  placed in this encounter.    Follow-Up Instructions: No follow-ups on file.   Fuller Plan, MD  Note - This record has been created using [employer].  Chart creation errors have been sought, but may not always  have been located. Such creation errors do not reflect on  the standard of medical care.

## 2022-09-10 ENCOUNTER — Encounter: Payer: Self-pay | Admitting: Internal Medicine

## 2022-09-10 ENCOUNTER — Other Ambulatory Visit: Payer: Self-pay | Admitting: *Deleted

## 2022-09-10 ENCOUNTER — Ambulatory Visit: Payer: 59 | Attending: Internal Medicine | Admitting: Internal Medicine

## 2022-09-10 VITALS — BP 119/69 | HR 60 | Resp 14 | Ht 68.0 in | Wt 261.0 lb

## 2022-09-10 DIAGNOSIS — R296 Repeated falls: Secondary | ICD-10-CM | POA: Diagnosis not present

## 2022-09-10 DIAGNOSIS — H93A3 Pulsatile tinnitus, bilateral: Secondary | ICD-10-CM

## 2022-09-10 DIAGNOSIS — M130 Polyarthritis, unspecified: Secondary | ICD-10-CM | POA: Diagnosis not present

## 2022-09-12 ENCOUNTER — Ambulatory Visit: Payer: 59 | Admitting: Cardiology

## 2022-09-13 ENCOUNTER — Other Ambulatory Visit: Payer: Self-pay | Admitting: Neurology

## 2022-09-21 ENCOUNTER — Ambulatory Visit: Payer: 59 | Admitting: Pulmonary Disease

## 2022-09-24 ENCOUNTER — Other Ambulatory Visit: Payer: Self-pay | Admitting: Cardiology

## 2022-09-24 DIAGNOSIS — I10 Essential (primary) hypertension: Secondary | ICD-10-CM

## 2022-10-15 ENCOUNTER — Ambulatory Visit: Payer: 59 | Admitting: Cardiology

## 2022-10-22 ENCOUNTER — Ambulatory Visit: Payer: 59 | Admitting: Physical Therapy

## 2022-11-02 ENCOUNTER — Other Ambulatory Visit: Payer: Self-pay

## 2022-11-02 ENCOUNTER — Encounter (HOSPITAL_COMMUNITY): Payer: Self-pay

## 2022-11-02 ENCOUNTER — Emergency Department (HOSPITAL_COMMUNITY): Payer: 59

## 2022-11-02 ENCOUNTER — Emergency Department (HOSPITAL_COMMUNITY)
Admission: EM | Admit: 2022-11-02 | Discharge: 2022-11-02 | Disposition: A | Discharge status: Home / Self Care | Payer: 59 | Attending: Emergency Medicine | Admitting: Emergency Medicine

## 2022-11-02 DIAGNOSIS — E11649 Type 2 diabetes mellitus with hypoglycemia without coma: Secondary | ICD-10-CM | POA: Diagnosis not present

## 2022-11-02 DIAGNOSIS — Z743 Need for continuous supervision: Secondary | ICD-10-CM | POA: Diagnosis not present

## 2022-11-02 DIAGNOSIS — W19XXXA Unspecified fall, initial encounter: Secondary | ICD-10-CM | POA: Diagnosis not present

## 2022-11-02 DIAGNOSIS — I6782 Cerebral ischemia: Secondary | ICD-10-CM | POA: Diagnosis not present

## 2022-11-02 DIAGNOSIS — E162 Hypoglycemia, unspecified: Secondary | ICD-10-CM | POA: Insufficient documentation

## 2022-11-02 DIAGNOSIS — J9811 Atelectasis: Secondary | ICD-10-CM | POA: Diagnosis not present

## 2022-11-02 DIAGNOSIS — S0990XA Unspecified injury of head, initial encounter: Secondary | ICD-10-CM | POA: Diagnosis not present

## 2022-11-02 DIAGNOSIS — Z7982 Long term (current) use of aspirin: Secondary | ICD-10-CM | POA: Diagnosis not present

## 2022-11-02 DIAGNOSIS — R42 Dizziness and giddiness: Secondary | ICD-10-CM | POA: Insufficient documentation

## 2022-11-02 DIAGNOSIS — R404 Transient alteration of awareness: Secondary | ICD-10-CM | POA: Diagnosis not present

## 2022-11-02 LAB — BASIC METABOLIC PANEL
Anion gap: 10 (ref 5–15)
BUN: 20 mg/dL (ref 8–23)
CO2: 29 mmol/L (ref 22–32)
Calcium: 9 mg/dL (ref 8.9–10.3)
Chloride: 100 mmol/L (ref 98–111)
Creatinine, Ser: 1.93 mg/dL — ABNORMAL HIGH (ref 0.44–1.00)
GFR, Estimated: 27 mL/min — ABNORMAL LOW (ref >60)
Glucose, Bld: 74 mg/dL (ref 70–99)
Potassium: 4.2 mmol/L (ref 3.5–5.1)
Sodium: 139 mmol/L (ref 135–145)

## 2022-11-02 LAB — CBC WITH DIFFERENTIAL/PLATELET
Abs Immature Granulocytes: 0.02 10*3/uL (ref 0.00–0.07)
Basophils Absolute: 0 10*3/uL (ref 0.0–0.1)
Basophils Relative: 0 %
Eosinophils Absolute: 0.2 10*3/uL (ref 0.0–0.5)
Eosinophils Relative: 3 %
HCT: 36.5 % (ref 36.0–46.0)
Hemoglobin: 11.4 g/dL — ABNORMAL LOW (ref 12.0–15.0)
Immature Granulocytes: 0 %
Lymphocytes Relative: 27 %
Lymphs Abs: 2.1 10*3/uL (ref 0.7–4.0)
MCH: 27.9 pg (ref 26.0–34.0)
MCHC: 31.2 g/dL (ref 30.0–36.0)
MCV: 89.2 fL (ref 80.0–100.0)
Monocytes Absolute: 0.5 10*3/uL (ref 0.1–1.0)
Monocytes Relative: 6 %
Neutro Abs: 5.1 10*3/uL (ref 1.7–7.7)
Neutrophils Relative %: 64 %
Platelets: 256 10*3/uL (ref 150–400)
RBC: 4.09 MIL/uL (ref 3.87–5.11)
RDW: 13.8 % (ref 11.5–15.5)
WBC: 8 10*3/uL (ref 4.0–10.5)
nRBC: 0 % (ref 0.0–0.2)

## 2022-11-02 LAB — URINALYSIS, ROUTINE W REFLEX MICROSCOPIC
Bilirubin Urine: NEGATIVE
Glucose, UA: NEGATIVE mg/dL
Hgb urine dipstick: NEGATIVE
Ketones, ur: NEGATIVE mg/dL
Leukocytes,Ua: NEGATIVE
Nitrite: NEGATIVE
Protein, ur: NEGATIVE mg/dL
Specific Gravity, Urine: 1.01 (ref 1.005–1.030)
pH: 7 (ref 5.0–8.0)

## 2022-11-02 LAB — CBG MONITORING, ED
Glucose-Capillary: 68 mg/dL — ABNORMAL LOW (ref 70–99)
Glucose-Capillary: 95 mg/dL (ref 70–99)
Glucose-Capillary: 81 mg/dL (ref 70–99)

## 2022-11-02 NOTE — Discharge Instructions (Signed)
Return for any problem.  ?

## 2022-11-02 NOTE — ED Triage Notes (Signed)
PT BIB [facility] with c/o dizziness, ongoing for a few weeks, worse yesterday and continued throughout the day today. Had a fall a few weeks ago and hit her head, was not evaluated then. Took her insulin this afternoon when her CBG was 230. D10 given PTA  CBG 65 BP 156/73 HR 60 92 room air

## 2022-11-02 NOTE — ED Provider Notes (Signed)
[place] EMERGENCY DEPARTMENT AT [facility] Provider Note   CSN: [id number] Arrival date & time: 11/02/22  1751     History  Chief Complaint  Patient presents with   Dizziness    Suzanne Patton is a 72 y.o. female.  72 year old female with prior medical history detailed below presents for evaluation.  Patient arrives with EMS transport.  Patient reported intermittent dizziness times several weeks.  Patient reports that symptoms were worse this afternoon so she called EMS.  EMS noted blood sugar during their evaluation was as low as 40.  Patient was given both oral and IV dextrose.  On arrival patient's blood glucose is 68.  Patient feels significantly improved.  Patient reports that she has labile blood glucose with high earlier today into the 200s.  She typically will go from very high blood sugars to very low blood sugars very quickly.  She denies focal weakness.  She denies pain.  She denies recent fever.  She denies other complaint.  The history is provided by the patient and medical records.       Home Medications Prior to Admission medications   Medication Sig Start Date End Date Taking? Authorizing Provider  albuterol (VENTOLIN HFA) 108 (90 Base) MCG/ACT inhaler Inhale 2 puffs into the lungs at bedtime as needed for wheezing or shortness of breath. Patient not taking: Reported on 09/10/2022 08/16/20   [provider]  aspirin 81 MG EC tablet Take 81 mg by mouth at bedtime.    [provider]  Blood Glucose Monitoring Suppl (ONE TOUCH ULTRA 2) w/Device KIT daily. 03/29/22   [provider]  CALCIUM CITRATE PO Take 4 tablets by mouth at bedtime.    [provider]  citalopram (CELEXA) 40 MG tablet Take 40 mg by mouth at bedtime. 03/01/20   [provider]  clonazePAM (KLONOPIN) 1 MG tablet Take 1 mg by mouth at bedtime. 07/10/22   [provider]  diclofenac Sodium (VOLTAREN) 1 % GEL Apply 2 g topically daily as needed  (pain).    [provider]  diltiazem (CARDIZEM CD) 120 MG 24 hr capsule Take 1 capsule (120 mg total) by mouth every morning. 04/26/22   Patwardhan, Anabel Bene, MD  ezetimibe (ZETIA) 10 MG tablet Take 10 mg by mouth every morning. 06/23/20   [provider]  furosemide (LASIX) 40 MG tablet Take 1 tablet (40 mg total) by mouth daily. 04/06/22 04/01/23  Patwardhan, Manish J, MD  GVOKE HYPOPEN 2-PACK 1 MG/0.2ML SOAJ Take 1 mg by mouth as needed (Low blood glucose).    [provider]  HYDROcodone-acetaminophen (NORCO) 7.5-325 MG tablet Take 1 tablet by mouth every 6 (six) hours as needed for severe pain.    [provider]  ibuprofen (ADVIL) 200 MG tablet Take 800 mg by mouth daily as needed for headache, mild pain or moderate pain (pain).    [provider]  insulin detemir (LEVEMIR) 100 UNIT/ML injection Inject 60 Units into the skin 2 (two) times daily.    [provider]  insulin lispro (HUMALOG) 100 UNIT/ML injection Inject 10-20 Units into the skin See admin instructions. Inject 10-20 units subcutaneously up to 3 times daily - sliding scale is based on CBG 01/16/20   [provider]  labetalol (NORMODYNE) 300 MG tablet Take 300 mg by mouth in the morning and at bedtime.    [provider]  lamoTRIgine (LAMICTAL) 200 MG tablet Take 200 mg by mouth 2 (two) times daily.  06/27/20   [provider]  Lancets (ONETOUCH ULTRASOFT) lancets TEST 3 TIMES A DAY AS DIRECTED 06/25/22   [provider]  levocetirizine (XYZAL) 5 MG tablet Take 5 mg by mouth at bedtime.    [provider]  losartan (COZAAR) 25 MG tablet TAKE 1 TABLET (25 MG TOTAL) BY MOUTH DAILY. 09/24/22 03/23/23  Patwardhan, Anabel Bene, MD  methocarbamol (ROBAXIN) 500 MG tablet Take 500 mg by mouth daily as needed. 08/01/22   [provider]  nitroGLYCERIN (NITROSTAT) 0.4 MG SL tablet Place 1 tablet (0.4 mg total) under the tongue every 5 (five) minutes as  needed for chest pain. 07/25/20 08/10/22  Patwardhan, Anabel Bene, MD  nortriptyline (PAMELOR) 10 MG capsule Take one or two po at bedtime 08/22/22   Sater, Pearletha Furl, MD  Olopatadine HCl (PATADAY OP) Place 1 drop into both eyes at bedtime. Extra strength    [provider]  Omega-3 Fatty Acids (FISH OIL OMEGA-3) 1000 MG CAPS Take 1,000 mg by mouth at bedtime.    [provider]  omeprazole (PRILOSEC) 20 MG capsule Take 20 mg by mouth every morning. 06/23/20   [provider]  [facility] ULTRA test strip 3 (three) times daily. for testing 07/27/22   [provider]  rosuvastatin (CRESTOR) 20 MG tablet Take 1 tablet (20 mg total) by mouth daily. 08/10/22 11/08/22  Patwardhan, Anabel Bene, MD  Semaglutide,0.25 or 0.5MG /DOS, 2 MG/3ML SOPN Inject 0.25 mg into the skin once a week. 08/10/22   Patwardhan, Anabel Bene, MD  sodium chloride (OCEAN) 0.65 % nasal spray Place 2 sprays into the nose at bedtime. [place]    [provider]      Allergies    Dulaglutide, Semaglutide(0.25 or 0.5mg -dos), Meclizine, Other, Prednisone, and Quinolones    Review of Systems   Review of Systems  All other systems reviewed and are negative.   Physical Exam Updated Vital Signs There were no vitals taken for this visit. Physical Exam Vitals and nursing note reviewed.  Constitutional:      General: She is not in acute distress.    Appearance: Normal appearance. She is well-developed.  HENT:     Head: Normocephalic and atraumatic.  Eyes:     Conjunctiva/sclera: Conjunctivae normal.     Pupils: Pupils are equal, round, and reactive to light.  Cardiovascular:     Rate and Rhythm: Normal rate and regular rhythm.     Heart sounds: Normal heart sounds.  Pulmonary:     Effort: Pulmonary effort is normal. No respiratory distress.     Breath sounds: Normal breath sounds.  Abdominal:     General: There is no distension.     Palpations: Abdomen is soft.     Tenderness: There is no abdominal  tenderness.  Musculoskeletal:        General: No deformity. Normal range of motion.     Cervical back: Normal range of motion and neck supple.  Skin:    General: Skin is warm and dry.  Neurological:     General: No focal deficit present.     Mental Status: She is alert and oriented to person, place, and time.     ED Results / Procedures / Treatments   Labs (all labs ordered are listed, but only abnormal results are displayed) Labs Reviewed  CBC WITH DIFFERENTIAL/PLATELET  BASIC METABOLIC PANEL  URINALYSIS, ROUTINE W REFLEX MICROSCOPIC  CBG MONITORING, ED    EKG None  Radiology No results found.  Procedures Procedures  Medications Ordered in ED Medications - No data to display  ED Course/ Medical Decision Making/ A&P                                 Medical Decision Making Amount and/or Complexity of Data Reviewed Labs: ordered. Radiology: ordered.    Medical Screen Complete  This patient presented to the ED with complaint of dizziness, hypoglycemia.  This complaint involves an extensive number of treatment options. The initial differential diagnosis includes, but is not limited to, metabolic abnormality, infection, etc.  This presentation is: Acute, Chronic, Self-Limited, Previously Undiagnosed, Uncertain Prognosis, Complicated, Systemic Symptoms, and Threat to Life/Bodily Function  Patient with multiple complaints including chronic dizziness and weakness.  Patient with reported increased symptoms today.  EMS noted blood glucose as low as 48 during transportation.  This was treated during transport.  On arrival patient's glucose is improved.  Patient reports that she feels significantly improved with correction of her hyperglycemia.  Other screening labs obtained are without significant abnormality.  Imaging obtained is also without significant abnormality.  Patient is reassured by workup findings.  She feels improved.  She desires discharge home.   Importance of close follow-up stressed.  Strict return precautions given and understood.    Additional history obtained:  Additional history obtained from EMS External records from outside sources obtained and reviewed including prior ED visits and prior Inpatient records.    Problem List / ED Course:  Dizziness, hypoglycemia   Reevaluation:  After the interventions noted above, I reevaluated the patient and found that they have: improved    Disposition:  After consideration of the diagnostic results and the patients response to treatment, I feel that the patent would benefit from close outpatient follow-up.          Final Clinical Impression(s) / ED Diagnoses Final diagnoses:  Dizziness  Hypoglycemia    Rx / DC Orders ED Discharge Orders     None         Wynetta Fines, MD 11/02/22 2121

## 2022-11-02 NOTE — ED Notes (Signed)
Pt CBG 68, MD Messick notified. Pt provided with apple juice

## 2022-11-04 ENCOUNTER — Emergency Department (HOSPITAL_COMMUNITY)
Admission: EM | Admit: 2022-11-04 | Discharge: 2022-11-05 | Disposition: A | Discharge status: Home / Self Care | Payer: 59 | Attending: Emergency Medicine | Admitting: Emergency Medicine

## 2022-11-04 ENCOUNTER — Emergency Department (HOSPITAL_COMMUNITY): Payer: 59

## 2022-11-04 ENCOUNTER — Encounter (HOSPITAL_COMMUNITY): Payer: Self-pay

## 2022-11-04 DIAGNOSIS — I10 Essential (primary) hypertension: Secondary | ICD-10-CM | POA: Diagnosis not present

## 2022-11-04 DIAGNOSIS — S0990XA Unspecified injury of head, initial encounter: Secondary | ICD-10-CM | POA: Diagnosis not present

## 2022-11-04 DIAGNOSIS — E119 Type 2 diabetes mellitus without complications: Secondary | ICD-10-CM | POA: Insufficient documentation

## 2022-11-04 DIAGNOSIS — I2510 Atherosclerotic heart disease of native coronary artery without angina pectoris: Secondary | ICD-10-CM | POA: Insufficient documentation

## 2022-11-04 DIAGNOSIS — I6782 Cerebral ischemia: Secondary | ICD-10-CM | POA: Diagnosis not present

## 2022-11-04 DIAGNOSIS — W1839XA Other fall on same level, initial encounter: Secondary | ICD-10-CM | POA: Insufficient documentation

## 2022-11-04 DIAGNOSIS — M4802 Spinal stenosis, cervical region: Secondary | ICD-10-CM | POA: Diagnosis not present

## 2022-11-04 DIAGNOSIS — R42 Dizziness and giddiness: Secondary | ICD-10-CM | POA: Diagnosis not present

## 2022-11-04 DIAGNOSIS — W19XXXA Unspecified fall, initial encounter: Secondary | ICD-10-CM | POA: Diagnosis not present

## 2022-11-04 DIAGNOSIS — M5021 Other cervical disc displacement,  high cervical region: Secondary | ICD-10-CM | POA: Diagnosis not present

## 2022-11-04 DIAGNOSIS — S0993XA Unspecified injury of face, initial encounter: Secondary | ICD-10-CM | POA: Diagnosis not present

## 2022-11-04 DIAGNOSIS — Z743 Need for continuous supervision: Secondary | ICD-10-CM | POA: Diagnosis not present

## 2022-11-04 DIAGNOSIS — M47812 Spondylosis without myelopathy or radiculopathy, cervical region: Secondary | ICD-10-CM | POA: Diagnosis not present

## 2022-11-04 DIAGNOSIS — M50321 Other cervical disc degeneration at C4-C5 level: Secondary | ICD-10-CM | POA: Diagnosis not present

## 2022-11-04 LAB — CBC WITH DIFFERENTIAL/PLATELET
Abs Immature Granulocytes: 0.01 10*3/uL (ref 0.00–0.07)
Basophils Absolute: 0 10*3/uL (ref 0.0–0.1)
Basophils Relative: 0 %
Eosinophils Absolute: 0.2 10*3/uL (ref 0.0–0.5)
Eosinophils Relative: 3 %
HCT: 35 % — ABNORMAL LOW (ref 36.0–46.0)
Hemoglobin: 11 g/dL — ABNORMAL LOW (ref 12.0–15.0)
Immature Granulocytes: 0 %
Lymphocytes Relative: 28 %
Lymphs Abs: 2 10*3/uL (ref 0.7–4.0)
MCH: 28.6 pg (ref 26.0–34.0)
MCHC: 31.4 g/dL (ref 30.0–36.0)
MCV: 90.9 fL (ref 80.0–100.0)
Monocytes Absolute: 0.5 10*3/uL (ref 0.1–1.0)
Monocytes Relative: 7 %
Neutro Abs: 4.5 10*3/uL (ref 1.7–7.7)
Neutrophils Relative %: 62 %
Platelets: 218 10*3/uL (ref 150–400)
RBC: 3.85 MIL/uL — ABNORMAL LOW (ref 3.87–5.11)
RDW: 14.3 % (ref 11.5–15.5)
WBC: 7.3 10*3/uL (ref 4.0–10.5)
nRBC: 0 % (ref 0.0–0.2)

## 2022-11-04 LAB — COMPREHENSIVE METABOLIC PANEL
ALT: 12 U/L (ref 0–44)
AST: 16 U/L (ref 15–41)
Albumin: 3.8 g/dL (ref 3.5–5.0)
Alkaline Phosphatase: 87 U/L (ref 38–126)
Anion gap: 13 (ref 5–15)
BUN: 19 mg/dL (ref 8–23)
CO2: 26 mmol/L (ref 22–32)
Calcium: 8.6 mg/dL — ABNORMAL LOW (ref 8.9–10.3)
Chloride: 97 mmol/L — ABNORMAL LOW (ref 98–111)
Creatinine, Ser: 2.04 mg/dL — ABNORMAL HIGH (ref 0.44–1.00)
GFR, Estimated: 25 mL/min — ABNORMAL LOW (ref >60)
Glucose, Bld: 175 mg/dL — ABNORMAL HIGH (ref 70–99)
Potassium: 4.2 mmol/L (ref 3.5–5.1)
Sodium: 136 mmol/L (ref 135–145)
Total Bilirubin: 0.6 mg/dL (ref 0.3–1.2)
Total Protein: 6.6 g/dL (ref 6.5–8.1)

## 2022-11-04 LAB — CBG MONITORING, ED: Glucose-Capillary: 94 mg/dL (ref 70–99)

## 2022-11-04 MED ORDER — GADOBUTROL 1 MMOL/ML IV SOLN
10.0000 mL | Freq: Once | INTRAVENOUS | Status: AC | PRN
  Administered 2022-11-04: 10 mL via INTRAVENOUS

## 2022-11-04 MED ORDER — ACETAMINOPHEN 500 MG PO TABS
1000.0000 mg | ORAL_TABLET | Freq: Once | ORAL | Status: AC
  Administered 2022-11-04: 1000 mg via ORAL
  Filled 2022-11-04: qty 2

## 2022-11-04 NOTE — ED Notes (Signed)
Meal tray ordered 

## 2022-11-04 NOTE — Discharge Instructions (Signed)
You were seen today for a fall with head trauma. While you were here we monitored your vitals, preformed a physical exam, and checked blood work, an EKG, and a brain MRI and CT. These were all reassuring and there is no indication for any further testing or intervention in the emergency department at this time.   Things to do:  - Follow up with your primary care provider within the next 1-2 weeks -Follow-up with your home health providers to establish care at home.  Return to the emergency department if you have any new or worsening symptoms including worsening dizziness, chest pain, shortness of breath, or if you have any other concerns.

## 2022-11-04 NOTE — ED Provider Notes (Signed)
Suzanne Patton is a 72 y.o. female with hypertension, hyperlipidemia, diabetes, CAD, SVT, obesity presenting after mechanical fall with head trauma.  Patient spilled some coffee at around 1300 this afternoon, bent down to clean the coffee up, and fell to the ground onto her head.  She denies loss of consciousness, and is not taking any anticoagulation.  She did not experience any chest pain, shortness of breath, palpitations surrounding the event.  She denies any syncope/presyncope prior to the fall.  Patient has been experiencing progressively worsening vertiginous sensation over the past few months and her gait has become more and more unstable.  She was evaluated here in the emergency department yesterday for dizziness.  Blood sugar in the ED last night was low and she was given IV and p.o. dextrose.  Her blood sugar returned to normal range and the rest of her workup was unremarkable and she was discharged.  She denies recent infectious symptoms, chest pains, but does endorse some ongoing shortness of breath that has been present for months and is unchanged from baseline.  Physical exam is notable for: - Overall well-appearing, no acute distress - Cardiopulmonary exam benign - No abdominal tenderness to palpation - No visual field deficits - Neurologically afocal with no dysmetria, sensory disturbance, motor deficits  On my initial evaluation, patient is:  -Vital signs stable. Patient afebrile, hemodynamically stable, and non-toxic appearing. -Additional history obtained from daughter over the phone  Given the patient's history and physical exam, differential diagnosis includes but is not limited to cardiac syncope, vasovagal syncope, orthostatic syncope, CVA, toxic metabolic disturbance, infection, mechanical fall, ICH, fracture/dislocations, etc.  Interpretations, interventions, and the patient's course of  care are documented below.    Given that workup from yesterday's ED visit was overall unremarkable, labs deferred at this time.  With fall and head trauma, CT scan ordered.  Negative for acute intracranial pathology.  EKG normal sinus rhythm with no ischemic change, interval disturbances, conduction blocks.  Blood sugar within normal limits today.  Given worsening vertiginous symptoms with new fall today and plans from outpatient providers to perform MRIs, MRI brain and C-spine ordered.  MRIs resulted negative.  Patient discharged in stable condition.  Instructed to follow-up with primary care provider.  Strict return precautions discussed.  Disposition:  I discussed the plan for discharge with the patient and/or their surrogate at bedside prior to discharge and they were in agreement with the plan and verbalized understanding of the return precautions provided. All questions answered to the best of my ability. Ultimately, the patient was discharged in stable condition with stable vital signs. I am reassured that they are capable of close follow up and good social support at home.   Clinical Impression:  1. Fall, initial encounter     Rx / DC Orders ED Discharge Orders          Ordered    Home Health        11/04/22 1947    Face-to-face encounter (required for Medicare/Medicaid patients)       Comments: I Suzanne Patton certify that this patient is under my care and that I, or a nurse practitioner or physician's assistant working with me, had a face-to-face encounter that meets the physician face-to-face encounter requirements with this patient on 11/04/2022. The encounter with the patient was in whole, or in part for the following medical condition(s) which is the primary reason  for home health care (List medical condition): frequent falls, vertigo   11/04/22 1947            The plan for this patient was discussed with Dr. Dalene Seltzer, who voiced agreement and who oversaw evaluation  and treatment of this patient.   Clinical Complexity A medically appropriate history, review of systems, and physical exam was performed.  My independent interpretations of EKG, labs, and radiology are documented in the ED course above.   Click here for ABCD2, HEART and other calculatorsREFRESH Note before signing   Patient's presentation is most consistent with acute presentation with potential threat to life or bodily function.  Medical Decision Making Amount and/or Complexity of Data Reviewed Labs: ordered. Radiology: ordered.  Risk OTC drugs. Prescription drug management.    HPI/ROS      See MDM section for pertinent HPI and ROS. A complete ROS was performed with pertinent positives/negatives noted above.   Past Medical History:  Diagnosis Date   Blood transfusion declined because patient is Jehovah's Witness    CAD (coronary artery disease)    Complication of anesthesia    Degenerative joint disease    Diabetes mellitus without complication (HCC)    Fibromyalgia    Hyperlipidemia    Hypertension    Neuromuscular disorder (HCC)    Osteoarthritis    PVD (peripheral vascular disease) (HCC)    s/p carotid stent   Sleep apnea    Stage 3b chronic kidney disease (CKD) (HCC)     Past Surgical History:  Procedure Laterality Date   ABLATION     CAROTID STENT     CHOLECYSTECTOMY     CORONARY PRESSURE/FFR STUDY N/A 03/20/2022   Procedure: INTRAVASCULAR PRESSURE WIRE/FFR STUDY;  Surgeon: Elder Negus, MD;  Location: [facility] INVASIVE CV LAB;  Service: Cardiovascular;  Laterality: N/A;   HYSTEROTOMY     IRRIGATION AND DEBRIDEMENT ABSCESS N/A 08/06/2021   Procedure: IRRIGATION AND DEBRIDEMENT ABDOMINAL WALL  ABSCESS;  Surgeon: Andria Meuse, MD;  Location: [facility] OR;  Service: General;  Laterality: N/A;   LEFT HEART CATH AND CORONARY ANGIOGRAPHY N/A 03/20/2022   Procedure: LEFT HEART CATH AND CORONARY ANGIOGRAPHY;  Surgeon: Elder Negus, MD;  Location: [facility] INVASIVE  CV LAB;  Service: Cardiovascular;  Laterality: N/A;   ROTATOR CUFF REPAIR Right       Physical Exam   Vitals:   11/04/22 1541 11/04/22 1543 11/04/22 1848 11/04/22 1934  BP: (!) 151/69  (!) 145/70 (!) 153/75  Pulse: 73  65 70  Resp: 20  18 15   Temp: 99 F (37.2 C)   98.7 F (37.1 C)  TempSrc: Oral   Oral  SpO2: 93% 95% 94% 95%    Physical Exam Vitals and nursing note reviewed.  Constitutional:      General: She is not in acute distress.    Appearance: She is well-developed.  HENT:     Head: Normocephalic and atraumatic.  Eyes:     Extraocular Movements: Extraocular movements intact.     Conjunctiva/sclera: Conjunctivae normal.     Pupils: Pupils are equal, round, and reactive to light.  Cardiovascular:     Rate and Rhythm: Normal rate and regular rhythm.     Heart sounds: No murmur heard. Pulmonary:     Effort: Pulmonary effort is normal. No respiratory distress.     Breath sounds: Normal breath sounds.  Abdominal:     Palpations: Abdomen is soft.     Tenderness: There is no abdominal tenderness.  Musculoskeletal:        General: No swelling.     Cervical back: Neck supple.  Skin:    General: Skin is warm and dry.     Capillary Refill: Capillary refill takes less than 2 seconds.  Neurological:     General: No focal deficit present.     Mental Status: She is alert and oriented to person, place, and time. Mental status is at baseline.  Psychiatric:        Mood and Affect: Mood normal.     Suzanne Arms, MD Department of Emergency Medicine   Please note that this documentation was produced with the assistance of voice-to-text technology and may contain errors.    Suzanne Iha, MD 11/04/22 8119    Suzanne Monday, MD 11/06/22 0111

## 2022-11-04 NOTE — ED Notes (Signed)
Patient is eating.

## 2022-11-04 NOTE — ED Notes (Signed)
Patient was assisted to the bathroom. Patient is now laying in bed.

## 2022-11-04 NOTE — ED Triage Notes (Addendum)
Pt BIB [facility] from home for a fall. Pt seen few days ago for low CBG. Pt woke up this morning CBG 79 she te breakfast. CBG currently 109. Pt reports she spilled her coffee and slipped on the coffee unnable to get up. Daughter called EMS. Pt has lac to forehead, bleeding controlled, not on thinners.   BP 150/100 HR 77 95% room air

## 2022-11-06 ENCOUNTER — Other Ambulatory Visit: Payer: 59

## 2022-11-07 ENCOUNTER — Encounter: Payer: Self-pay | Admitting: Pulmonary Disease

## 2022-11-07 ENCOUNTER — Ambulatory Visit (INDEPENDENT_AMBULATORY_CARE_PROVIDER_SITE_OTHER): Payer: 59 | Admitting: Pulmonary Disease

## 2022-11-07 VITALS — BP 132/64 | HR 63 | Temp 97.6°F | Ht 68.0 in | Wt 260.0 lb

## 2022-11-07 DIAGNOSIS — G4733 Obstructive sleep apnea (adult) (pediatric): Secondary | ICD-10-CM

## 2022-11-07 NOTE — Progress Notes (Signed)
Suzanne Patton    [id number]    12/14/1950  Primary Care Physician:Collins, Annabelle Harman, DO  Referring Physician: Irena Reichmann, DO [address] [place],  [place] [address]  Chief complaint:   Patient being seen for sleep apnea  HPI:  Diagnosed with sleep apnea in 2003, has been using CPAP since then  She was having some difficulty with her CPAP recently, she does have 2 devices and she was able to initiate using auto device Has also been having some mask issues  DME company is [employer]  Multiple comorbidities, shortness of breath on exertion Musculoskeletal pain and discomfort Limited with shortness of breath on exertion -Evaluation so far has been unrevealing -Diastolic dysfunction on recent echo, -Left heart catheterization with nonobstructive coronary disease  Multiple falls Recent ED visit  Sleep study is not available -Has been using CPAP and compliant with CPAP  Usually goes to bed about 9 to 10:30 PM Falls asleep immediately About 1 awakening Wakes up between 930 and 11 AM  She does have dryness of her mouth in the morning  Continues to have daytime fatigue which is multifactorial  Unable to tolerate any significant exertion  She does have a history of anxiety/depression Never smoked Was exposed to secondhand smoke growing up  She is a retired Char[employer] fundraiser  She stated she has had COVID at least 5 times with concern for long COVID  Outpatient Encounter Medications as of 11/07/2022  Medication Sig   albuterol (VENTOLIN HFA) 108 (90 Base) MCG/ACT inhaler Inhale 2 puffs into the lungs at bedtime as needed for wheezing or shortness of breath.   aspirin 81 MG EC tablet Take 81 mg by mouth at bedtime.   Blood Glucose Monitoring Suppl (ONE TOUCH ULTRA 2) w/Device KIT daily.   CALCIUM CITRATE PO Take 4 tablets by mouth at bedtime.   citalopram (CELEXA) 40 MG tablet Take 40 mg by mouth at bedtime.   clonazePAM (KLONOPIN) 1 MG tablet Take 1 mg by mouth at  bedtime.   diclofenac Sodium (VOLTAREN) 1 % GEL Apply 2 g topically daily as needed (pain).   diltiazem (CARDIZEM CD) 120 MG 24 hr capsule Take 1 capsule (120 mg total) by mouth every morning.   ezetimibe (ZETIA) 10 MG tablet Take 10 mg by mouth every morning.   furosemide (LASIX) 40 MG tablet Take 1 tablet (40 mg total) by mouth daily.   GVOKE HYPOPEN 2-PACK 1 MG/0.2ML SOAJ Take 1 mg by mouth as needed (Low blood glucose).   HYDROcodone-acetaminophen (NORCO) 7.5-325 MG tablet Take 1 tablet by mouth every 6 (six) hours as needed for severe pain.   ibuprofen (ADVIL) 200 MG tablet Take 800 mg by mouth daily as needed for headache, mild pain or moderate pain (pain).   insulin detemir (LEVEMIR) 100 UNIT/ML injection Inject 60 Units into the skin 2 (two) times daily.   insulin lispro (HUMALOG) 100 UNIT/ML injection Inject 10-20 Units into the skin See admin instructions. Inject 10-20 units subcutaneously up to 3 times daily - sliding scale is based on CBG   labetalol (NORMODYNE) 300 MG tablet Take 300 mg by mouth in the morning and at bedtime.   lamoTRIgine (LAMICTAL) 200 MG tablet Take 200 mg by mouth 2 (two) times daily.   Lancets (ONETOUCH ULTRASOFT) lancets TEST 3 TIMES A DAY AS DIRECTED   levocetirizine (XYZAL) 5 MG tablet Take 5 mg by mouth at bedtime.   losartan (COZAAR) 25 MG tablet TAKE 1 TABLET (  25 MG TOTAL) BY MOUTH DAILY.   methocarbamol (ROBAXIN) 500 MG tablet Take 500 mg by mouth daily as needed.   nortriptyline (PAMELOR) 10 MG capsule Take one or two po at bedtime   Olopatadine HCl (PATADAY OP) Place 1 drop into both eyes at bedtime. Extra strength   Omega-3 Fatty Acids (FISH OIL OMEGA-3) 1000 MG CAPS Take 1,000 mg by mouth at bedtime.   omeprazole (PRILOSEC) 20 MG capsule Take 20 mg by mouth every morning.   ONETOUCH ULTRA test strip 3 (three) times daily. for testing   rosuvastatin (CRESTOR) 20 MG tablet Take 1 tablet (20 mg total) by mouth daily.   sodium chloride (OCEAN) 0.65 %  nasal spray Place 2 sprays into the nose at bedtime. Ayr   nitroGLYCERIN (NITROSTAT) 0.4 MG SL tablet Place 1 tablet (0.4 mg total) under the tongue every 5 (five) minutes as needed for chest pain.   [DISCONTINUED] Semaglutide,0.25 or 0.5MG /DOS, 2 MG/3ML SOPN Inject 0.25 mg into the skin once a week.   No facility-administered encounter medications on file as of 11/07/2022.    Allergies as of 11/07/2022 - Review Complete 11/07/2022  Allergen Reaction Noted   Dulaglutide Shortness Of Breath 04/11/2020   Semaglutide(0.25 or 0.5mg -dos) Nausea And Vomiting and Nausea Only 08/06/2021   Meclizine Other (See Comments) 05/28/2014   Other Other (See Comments) 05/28/2014   Prednisone Other (See Comments) 10/31/2020   Quinolones Other (See Comments) 10/31/2020    Past Medical History:  Diagnosis Date   Blood transfusion declined because patient is Jehovah's Witness    CAD (coronary artery disease)    Complication of anesthesia    Degenerative joint disease    Diabetes mellitus without complication (HCC)    Fibromyalgia    Hyperlipidemia    Hypertension    Neuromuscular disorder (HCC)    Osteoarthritis    PVD (peripheral vascular disease) (HCC)    s/p carotid stent   Sleep apnea    Stage 3b chronic kidney disease (CKD) (HCC)     Past Surgical History:  Procedure Laterality Date   ABLATION     CAROTID STENT     CHOLECYSTECTOMY     CORONARY PRESSURE/FFR STUDY N/A 03/20/2022   Procedure: INTRAVASCULAR PRESSURE WIRE/FFR STUDY;  Surgeon: Elder Negus, MD;  Location: [facility];  Service: Cardiovascular;  Laterality: N/A;   HYSTEROTOMY     IRRIGATION AND DEBRIDEMENT ABSCESS N/A 08/06/2021   Procedure: IRRIGATION AND DEBRIDEMENT ABDOMINAL WALL  ABSCESS;  Surgeon: Andria Meuse, MD;  Location: [facility] OR;  Service: General;  Laterality: N/A;   LEFT HEART CATH AND CORONARY ANGIOGRAPHY N/A 03/20/2022   Procedure: LEFT HEART CATH AND CORONARY ANGIOGRAPHY;  Surgeon: Elder Negus, MD;  Location: [facility];  Service: Cardiovascular;  Laterality: N/A;   ROTATOR CUFF REPAIR Right     Family History  Problem Relation Age of Onset   Atrial fibrillation Mother    Kidney failure Mother    Heart disease Father    Heart attack Father    Heart failure Father    COPD Father    Gallbladder disease Brother        gangrene    Social History   Socioeconomic History   Marital status: Single    Spouse name: Not on file   Number of children: 3   Years of education: Not on file   Highest education level: Not on file  Occupational History   Occupation: retired psychiatry nurse  Tobacco  Use   Smoking status: Never    Passive exposure: Current   Smokeless tobacco: Never  Vaping Use   Vaping status: Never Used  Substance and Sexual Activity   Alcohol use: Yes    Comment: rare   Drug use: Never   Sexual activity: Not on file  Other Topics Concern   Not on file  Social History Narrative   Right handed   Wears prescription glasses    Drinks coffee daily (2 cups)   Social Determinants of Health   Financial Resource Strain: Not on file  Food Insecurity: Not on file  Transportation Needs: Not on file  Physical Activity: Not on file  Stress: Not on file  Social Connections: Not on file  Intimate Partner Violence: Not on file    Review of Systems  Constitutional:  Positive for fatigue.  Respiratory:  Positive for apnea and shortness of breath.   Gastrointestinal:  Positive for constipation.  Psychiatric/Behavioral:  Positive for sleep disturbance.     Vitals:   11/07/22 1423  BP: 132/64  Pulse: 63  Temp: 97.6 F (36.4 C)  SpO2: 94%   Physical Exam Constitutional:      Appearance: She is obese.  HENT:     Head: Normocephalic.     Mouth/Throat:     Mouth: Mucous membranes are moist.  Eyes:     General: No scleral icterus.    Pupils: Pupils are equal, round, and reactive to light.  Cardiovascular:     Rate and Rhythm: Normal  rate and regular rhythm.     Heart sounds: No murmur heard.    No friction rub.  Pulmonary:     Effort: No respiratory distress.     Breath sounds: No stridor. No wheezing or rhonchi.  Musculoskeletal:     Cervical back: No rigidity or tenderness.  Neurological:     Mental Status: She is alert.  Psychiatric:        Mood and Affect: Mood normal.       05/21/2022    1:00 PM  Results of the Epworth flowsheet  Sitting and reading 1  Watching TV 1  Sitting, inactive in a public place (e.g. a theatre or a meeting) 0  As a passenger in a car for an hour without a break 0  Lying down to rest in the afternoon when circumstances permit 1  Sitting and talking to someone 0  Sitting quietly after a lunch without alcohol 0  In a car, while stopped for a few minutes in traffic 0  Total score 3     Data Reviewed: Sleep study is not available to be reviewed  Most recent download is for March to June showing about 10 hours 53 minutes of use Residual AHI of 5.6  Assessment:  Obstructive sleep apnea -On CPAP therapy -Encouraged to continue using CPAP nightly  Mask issues -Will make a referral to DME company for a new mask  Shortness of breath on exertion -This is multifactorial Deconditioning may be playing a large role  Mood disorder  Stage IIIb chronic kidney disease  Plan/Recommendations:  Encouraged to continue using CPAP on a nightly basis  Encouraged to reach out to [employer] to have her machine looked at to make sure it is working well  Prescription will [facility] to [facility] CPAP supplies  Encouraged to call with significant concerns  Follow-up in 6 months   Virl Diamond MD [facility] 11/07/2022, 2:38 PM  CC: Irena Reichmann,  DO

## 2022-11-07 NOTE — Patient Instructions (Addendum)
Continue using your CPAP on a nightly basis  Try and make an appointment with [employer] to have your machine checked out  DME referral for CPAP supplies-new mask  Call us with significant concerns  Follow-up in 6 months

## 2022-11-09 ENCOUNTER — Other Ambulatory Visit: Payer: Self-pay | Admitting: Cardiology

## 2022-11-09 DIAGNOSIS — I25118 Atherosclerotic heart disease of native coronary artery with other forms of angina pectoris: Secondary | ICD-10-CM

## 2022-11-13 ENCOUNTER — Telehealth: Payer: Self-pay

## 2022-11-13 NOTE — Telephone Encounter (Signed)
Transition Care Management Unsuccessful Follow-up Telephone Call  Date of discharge and from where:  11/05/2022 [facility]  Attempts:  2nd Attempt  Reason for unsuccessful TCM follow-up call:  Left voice message  Deon Ivey [facility]  [facility] Population Health Community Resource Care Guide   [email or web address]  ?? [phone]   Website: [email or web address]  [email or web address]

## 2022-11-13 NOTE — Telephone Encounter (Signed)
Transition Care Management Unsuccessful Follow-up Telephone Call  Date of discharge and from where:  11/05/2022 [facility]  Attempts:  1st Attempt  Reason for unsuccessful TCM follow-up call:  No answer/busy  Zerina Hallinan [facility]  [facility] Population Health Community Resource Care Guide   [email or web address]  ?? [phone]   Website: [email or web address]  [email or web address]

## 2022-11-15 ENCOUNTER — Ambulatory Visit (INDEPENDENT_AMBULATORY_CARE_PROVIDER_SITE_OTHER): Payer: 59 | Admitting: Podiatry

## 2022-11-15 DIAGNOSIS — M79675 Pain in left toe(s): Secondary | ICD-10-CM

## 2022-11-15 DIAGNOSIS — E1142 Type 2 diabetes mellitus with diabetic polyneuropathy: Secondary | ICD-10-CM | POA: Diagnosis not present

## 2022-11-15 DIAGNOSIS — B351 Tinea unguium: Secondary | ICD-10-CM

## 2022-11-15 DIAGNOSIS — M79674 Pain in right toe(s): Secondary | ICD-10-CM

## 2022-11-15 NOTE — Progress Notes (Signed)
  Subjective:  Patient ID: Suzanne Patton, female    DOB: Sep 17, 1950,  MRN: [id number]  Chief Complaint  Patient presents with   Nail Problem    Diabetic Foot Care-nail trim     72 y.o. female presents with the above complaint. History confirmed with patient. Patient presenting with pain related to dystrophic thickened elongated nails. Patient is unable to trim own nails related to nail dystrophy and/or mobility issues. Patient does have a history of T2DM.  Patient hoping to get new diabetic shoes at this exam.  She does have a some numbness in both feet.  She is having difficulty trimming nails.  Objective:  Physical Exam: warm, good capillary refill nail exam onychomycosis of the toenails, onycholysis, and dystrophic nails DP pulses palpable, PT pulses palpable, and protective sensation absent Left Foot:  Pain with palpation of nails due to elongation and dystrophic growth. Hammertoe deformity of the lesser digits 2 through 5 Right Foot: Pain with palpation of nails due to elongation and dystrophic growth.  Hallux malleus of the right foot.  Hammertoe deformity of the lesser digits 2 through 5  Assessment:   1. Pain due to onychomycosis of toenails of both feet   2. DM type 2 with diabetic peripheral neuropathy (HCC)        Plan:  Patient was evaluated and treated and all questions answered.  #Onychomycosis with pain  -Nails palliatively debrided as below. -Educated on self-care  Procedure: Nail Debridement Rationale: Pain Type of Debridement: manual, sharp debridement. Instrumentation: Nail nipper, rotary burr. Number of Nails: 10  Return in about 3 months (around 02/15/2023) for [facility].         Corinna Gab, DPM [facility] / [facility]

## 2022-11-16 NOTE — Progress Notes (Signed)
Error

## 2022-11-30 ENCOUNTER — Telehealth: Payer: Self-pay | Admitting: Pulmonary Disease

## 2022-11-30 NOTE — Telephone Encounter (Signed)
Patient is calling because she needs a new CPAP mask.She's contacted [facility] but they do not have the prescription. Please call and advise [phone]

## 2022-12-06 NOTE — Telephone Encounter (Signed)
Order sent to [facility] due to insurance

## 2022-12-06 NOTE — Telephone Encounter (Signed)
Sent message to [employer]

## 2022-12-13 DIAGNOSIS — G4733 Obstructive sleep apnea (adult) (pediatric): Secondary | ICD-10-CM | POA: Diagnosis not present

## 2022-12-17 ENCOUNTER — Ambulatory Visit (INDEPENDENT_AMBULATORY_CARE_PROVIDER_SITE_OTHER): Payer: 59 | Admitting: Neurology

## 2022-12-17 ENCOUNTER — Encounter: Payer: Self-pay | Admitting: Neurology

## 2022-12-17 VITALS — BP 142/72 | HR 62 | Ht 68.0 in | Wt 260.5 lb

## 2022-12-17 DIAGNOSIS — R42 Dizziness and giddiness: Secondary | ICD-10-CM | POA: Diagnosis not present

## 2022-12-17 DIAGNOSIS — Z794 Long term (current) use of insulin: Secondary | ICD-10-CM | POA: Diagnosis not present

## 2022-12-17 DIAGNOSIS — E1142 Type 2 diabetes mellitus with diabetic polyneuropathy: Secondary | ICD-10-CM | POA: Diagnosis not present

## 2022-12-17 DIAGNOSIS — R519 Headache, unspecified: Secondary | ICD-10-CM | POA: Diagnosis not present

## 2022-12-17 DIAGNOSIS — G44209 Tension-type headache, unspecified, not intractable: Secondary | ICD-10-CM

## 2022-12-17 DIAGNOSIS — M4802 Spinal stenosis, cervical region: Secondary | ICD-10-CM | POA: Diagnosis not present

## 2022-12-17 DIAGNOSIS — M47812 Spondylosis without myelopathy or radiculopathy, cervical region: Secondary | ICD-10-CM

## 2022-12-17 DIAGNOSIS — M542 Cervicalgia: Secondary | ICD-10-CM

## 2022-12-17 NOTE — Progress Notes (Unsigned)
[facility]  PATIENT: Suzanne Patton DOB: 03-16-1951  REFERRING DOCTOR OR PCP: Suzanne Reichmann, DO SOURCE: Patient, note from emergency room, imaging and lab reports, CT scan personally reviewed.  _________________________________   HISTORICAL  CHIEF COMPLAINT:  Chief Complaint  Patient presents with   Room 10    Pt is here Alone. Pt states that the week after she seen [place] she had fallen and hit her head and tail bone. Pt has DDD and has pain in her lower back.     HISTORY OF PRESENT ILLNESS:  Suzanne Patton is a 72 y.o. woman with headache, gait disturbance and confusion   Update 12/17/2022: To help with the pain at the last visit, I did bilateral splenius capitis trigger point injections (would also help for occipital neuralgia).     She felt much better after this injection but pain returned after a fall a few weeks later.     She had increased HA and sacral pain afterwards.     She also slipped in August and went to the ED.  She had MRI brain and cervical spine (was scheduled the next week).     The MRI of the brain showed no acute findings.  She did have mild chronic microvascular ischemic change.  Internal auditory canals were fine.  MRI of the cervical spine showed multilevel cervical spondylosis.  She has facet hypertrophy to the right at C2-C3.  There was mild spinal stenosis at C4-C5, C5-C6 and C6-C7.  Multilevel foraminal narrowing that could affect the right C4 nerve root at C3-C4, either C5 nerve root at C4-C5, the right C6 nerve root at C5-C6 or the left C7 nerve root at C6-C7.    We discussed no etiology of vertigo was seen.   DJD/DDD could cause her neck and arm/shoulder pain.   Spinal cord was fine.    Due to elevated creatinine, she has been advised to avoid NSAIDs.   She has OSA and is on CPAP.  She just changed to nasal pillows mask.        Vascular Risks:   She has HTN and Type 2 IDDM, OSA and CAD  History of symptoms: I first saw her 08/22/2022.  At  the time, she was reporting lots of problems going on over the last 30 years and having a lot of tests.    She rambled on so I tried to get her to focus on her main problems.  She reports a headache and dizziness are the worse problem.  She reports her HA's are daily.   They are mostly at the vertex, often with pain radiation up form the occiput.   She used ibuprofen and [employer] but only gets a couple hours benefit.  She denies N/V with headaches though sometimes notes vision seems worse.   She reports ome photophobia but not phonophobia.    Moving her head sometimes worsens the pain.   She denies being on prophylactic medications   She also has neck pain and uses Voltaren gel or NSAIDs without much benefit.   She reports having imaging at [facility] but we do not have acces to their studies.  We do have a spine CT form 2023 after a fall showing DJD, including at C2C3 -- right facet hypertrophy and right foraminal narrowing  She has vertigo that is not always associated with movements.  This has been present x years but worsening in general.   She notes it most when laying down.  Hearing,  however, is symmstric.  She has some tinnitus on the left now and then. She also sees Dr. Dimple Casey (Rheumatology)  She has arthritis in neck, knees and shoulders.   She has IDDM Type 2 x 24 years  Her daughter has reported that she is confused but she denies any significant cognitive issues.   She was able to answer questions appropriately.   She is on lamotrigine, citalopram for bipolar.     Imaging CT 07/15/2022 showed brain  fairly normal for age with minimal atrophy and chronic microvascular ischemic changes.   Cervical spine showed  There is disc space narrowing and degenerative endplate change most advanced at C4-C5 at C6-C7. There is overall mild facet arthropathy most advanced on the right at C2-C3. There is no high-grade spinal canal stenosis though likely mild  at C4C5 and C6C7    REVIEW OF  SYSTEMS: Constitutional: No fevers, chills, sweats, or change in appetite Eyes: No visual changes, double vision, eye pain Ear, nose and throat: No hearing loss, ear pain, nasal congestion, sore throat Cardiovascular: No chest pain, palpitations Respiratory:  No shortness of breath at rest or with exertion.   No wheezes GastrointestinaI: No nausea, vomiting, diarrhea, abdominal pain, fecal incontinence Genitourinary:  No dysuria, urinary retention or frequency.  No nocturia. Musculoskeletal: Neck pain as above.  She also has some back pain.   Integumentary: No rash, pruritus, skin lesions Neurological: as above Psychiatric: she has bipolar disease. Endocrine: No palpitations, diaphoresis, change in appetite, change in weigh or increased thirst Hematologic/Lymphatic:  No anemia, purpura, petechiae. Allergic/Immunologic: No itchy/runny eyes, nasal congestion, recent allergic reactions, rashes  ALLERGIES: Allergies  Allergen Reactions   Dulaglutide Shortness Of Breath    Other Reaction(s): respiratory distress   Semaglutide(0.25 Or 0.5mg -Dos) Nausea And Vomiting and Nausea Only   Meclizine Other (See Comments)    Psychotic episode   Other Other (See Comments)    A- blood type diet  Low fat, Healthy heart   Prednisone Other (See Comments)    hyperglycemia   Quinolones Other (See Comments)    severe muscle aches    HOME MEDICATIONS:  Current Outpatient Medications:    aspirin 81 MG EC tablet, Take 81 mg by mouth at bedtime., Disp: , Rfl:    Blood Glucose Monitoring Suppl (ONE TOUCH ULTRA 2) w/Device KIT, daily., Disp: , Rfl:    CALCIUM CITRATE PO, Take 4 tablets by mouth at bedtime., Disp: , Rfl:    citalopram (CELEXA) 40 MG tablet, Take 40 mg by mouth at bedtime., Disp: , Rfl:    clonazePAM (KLONOPIN) 1 MG tablet, Take 1 mg by mouth at bedtime., Disp: , Rfl:    diclofenac Sodium (VOLTAREN) 1 % GEL, Apply 2 g topically daily as needed (pain)., Disp: , Rfl:    diltiazem (CARDIZEM  CD) 120 MG 24 hr capsule, Take 1 capsule (120 mg total) by mouth every morning., Disp: 90 capsule, Rfl: 3   ezetimibe (ZETIA) 10 MG tablet, Take 10 mg by mouth every morning., Disp: , Rfl:    furosemide (LASIX) 40 MG tablet, Take 1 tablet (40 mg total) by mouth daily., Disp: 90 tablet, Rfl: 3   GVOKE HYPOPEN 2-PACK 1 MG/0.2ML SOAJ, Take 1 mg by mouth as needed (Low blood glucose)., Disp: , Rfl:    HYDROcodone-acetaminophen (NORCO) 7.5-325 MG tablet, Take 1 tablet by mouth every 6 (six) hours as needed for severe pain., Disp: , Rfl:    ibuprofen (ADVIL) 200 MG tablet, Take 800 mg by mouth  daily as needed for headache, mild pain or moderate pain (pain)., Disp: , Rfl:    insulin detemir (LEVEMIR) 100 UNIT/ML injection, Inject 60 Units into the skin 2 (two) times daily., Disp: , Rfl:    insulin lispro (HUMALOG) 100 UNIT/ML injection, Inject 10-20 Units into the skin See admin instructions. Inject 10-20 units subcutaneously up to 3 times daily - sliding scale is based on CBG, Disp: , Rfl:    labetalol (NORMODYNE) 300 MG tablet, Take 300 mg by mouth in the morning and at bedtime., Disp: , Rfl:    lamoTRIgine (LAMICTAL) 200 MG tablet, Take 200 mg by mouth 2 (two) times daily., Disp: , Rfl:    Lancets (ONETOUCH ULTRASOFT) lancets, TEST 3 TIMES A DAY AS DIRECTED, Disp: , Rfl:    levocetirizine (XYZAL) 5 MG tablet, Take 5 mg by mouth at bedtime., Disp: , Rfl:    losartan (COZAAR) 25 MG tablet, TAKE 1 TABLET (25 MG TOTAL) BY MOUTH DAILY., Disp: 90 tablet, Rfl: 1   methocarbamol (ROBAXIN) 500 MG tablet, Take 500 mg by mouth daily as needed., Disp: , Rfl:    nortriptyline (PAMELOR) 10 MG capsule, Take one or two po at bedtime, Disp: 60 capsule, Rfl: 5   Olopatadine HCl (PATADAY OP), Place 1 drop into both eyes at bedtime. Extra strength, Disp: , Rfl:    Omega-3 Fatty Acids (FISH OIL OMEGA-3) 1000 MG CAPS, Take 1,000 mg by mouth at bedtime., Disp: , Rfl:    omeprazole (PRILOSEC) 20 MG capsule, Take 20 mg by mouth  every morning., Disp: , Rfl:    ONETOUCH ULTRA test strip, 3 (three) times daily. for testing, Disp: , Rfl:    sodium chloride (OCEAN) 0.65 % nasal spray, Place 2 sprays into the nose at bedtime. Ayr, Disp: , Rfl:    albuterol (VENTOLIN HFA) 108 (90 Base) MCG/ACT inhaler, Inhale 2 puffs into the lungs at bedtime as needed for wheezing or shortness of breath. (Patient not taking: Reported on 12/17/2022), Disp: , Rfl:    nitroGLYCERIN (NITROSTAT) 0.4 MG SL tablet, Place 1 tablet (0.4 mg total) under the tongue every 5 (five) minutes as needed for chest pain., Disp: 30 tablet, Rfl: 3   rosuvastatin (CRESTOR) 20 MG tablet, Take 1 tablet (20 mg total) by mouth daily., Disp: 90 tablet, Rfl: 3  PAST MEDICAL HISTORY: Past Medical History:  Diagnosis Date   Blood transfusion declined because patient is Jehovah's Witness    CAD (coronary artery disease)    Complication of anesthesia    Degenerative joint disease    Diabetes mellitus without complication (HCC)    Fibromyalgia    Hyperlipidemia    Hypertension    Neuromuscular disorder (HCC)    Osteoarthritis    PVD (peripheral vascular disease) (HCC)    s/p carotid stent   Sleep apnea    Stage 3b chronic kidney disease (CKD) (HCC)     PAST SURGICAL HISTORY: Past Surgical History:  Procedure Laterality Date   ABLATION     CAROTID STENT     CHOLECYSTECTOMY     CORONARY PRESSURE/FFR STUDY N/A 03/20/2022   Procedure: INTRAVASCULAR PRESSURE WIRE/FFR STUDY;  Surgeon: Elder Negus, MD;  Location: [facility] INVASIVE CV [facility];  Service: Cardiovascular;  Laterality: N/A;   HYSTEROTOMY     IRRIGATION AND DEBRIDEMENT ABSCESS N/A 08/06/2021   Procedure: IRRIGATION AND DEBRIDEMENT ABDOMINAL WALL  ABSCESS;  Surgeon: Andria Meuse, MD;  Location: [facility] OR;  Service: General;  Laterality: N/A;   LEFT HEART CATH  AND CORONARY ANGIOGRAPHY N/A 03/20/2022   Procedure: LEFT HEART CATH AND CORONARY ANGIOGRAPHY;  Surgeon: Elder Negus, MD;  Location: [facility]  INVASIVE CV LAB;  Service: Cardiovascular;  Laterality: N/A;   ROTATOR CUFF REPAIR Right     FAMILY HISTORY: Family History  Problem Relation Age of Onset   Atrial fibrillation Mother    Kidney failure Mother    Heart disease Father    Heart attack Father    Heart failure Father    COPD Father    Gallbladder disease Brother        gangrene    SOCIAL HISTORY: Social History   Socioeconomic History   Marital status: Single    Spouse name: Not on file   Number of children: 3   Years of education: Not on file   Highest education level: Not on file  Occupational History   Occupation: retired psychiatry nurse  Tobacco Use   Smoking status: Never    Passive exposure: Current   Smokeless tobacco: Never  Vaping Use   Vaping status: Never Used  Substance and Sexual Activity   Alcohol use: Yes    Comment: rare   Drug use: Never   Sexual activity: Not on file  Other Topics Concern   Not on file  Social History Narrative   Right handed   Wears prescription glasses    Drinks coffee daily (2 cups)   Social Determinants of Health   Financial Resource Strain: Not on file  Food Insecurity: Not on file  Transportation Needs: Not on file  Physical Activity: Not on file  Stress: Not on file  Social Connections: Not on file  Intimate Partner Violence: Not on file       PHYSICAL EXAM  Vitals:   12/17/22 1356  BP: (!) 142/72  Pulse: 62  Weight: 260 lb 8 oz (118.2 kg)  Height: 5\' 8"  (1.727 m)    Body mass index is 39.61 kg/m.   General: The patient is well-developed and well-nourished and in no acute distress  HEENT:  Head is /AT.  Sclera are anicteric.    Neck: The neck is tender over the splenius capitis muscles, right greater than left.  Slightly reduced range of motion.   Skin: Extremities are without rash or  edema.  Musculoskeletal:  Back is nontender  Neurologic Exam  Mental status: The patient is alert and oriented x 3 at the time of the  examination. The patient has apparent normal recent and remote memory, with an apparently normal attention span and concentration ability.   Speech is normal.  Cranial nerves: Extraocular movements are full.  There is good facial sensation to soft touch bilaterally.Facial strength is normal.  Trapezius and sternocleidomastoid strength is normal. No dysarthria is noted . No obvious hearing deficits are noted.  The Weber did not lateralize.  Motor:  Muscle bulk is normal.   Tone is normal. Strength is  5 / 5 in all 4 extremities.   Sensory: Sensory testing is intact to pinprick, soft touch and vibration sensation in the arms except mild dorsal 3rd finger numbness).  In legs, she had mild vibration sensation loss at left knee and moderate in right foot and moderate to severe in right foot (0 toes and 20% ankles).  Pinprick wa reduced in both feet, right slightly more than left.  Coordination: Cerebellar testing reveals good finger-nose-finger and heel-to-shin bilaterally.  Gait and station: Station is normal.   Gait is mildly arthritic. Tandem gait is mildly wide.  Romberg is negative.   Reflexes: Deep tendon reflexes are symmetric and 2 in the arms, 1 at the knees and trace at the ankles bilaterally.   Plantar responses are flexor.    DIAGNOSTIC DATA (LABS, IMAGING, TESTING) - I reviewed patient records, labs, notes, testing and imaging myself where available.  Lab Results  Component Value Date   WBC 7.3 11/04/2022   HGB 11.0 (L) 11/04/2022   HCT 35.0 (L) 11/04/2022   MCV 90.9 11/04/2022   PLT 218 11/04/2022      Component Value Date/Time   NA 136 11/04/2022 2042   NA 136 03/14/2022 1527   K 4.2 11/04/2022 2042   CL 97 (L) 11/04/2022 2042   CO2 26 11/04/2022 2042   GLUCOSE 175 (H) 11/04/2022 2042   BUN 19 11/04/2022 2042   BUN 28 (H) 03/14/2022 1527   CREATININE 2.04 (H) 11/04/2022 2042   CALCIUM 8.6 (L) 11/04/2022 2042   PROT 6.6 11/04/2022 2042   ALBUMIN 3.8 11/04/2022 2042    AST 16 11/04/2022 2042   ALT 12 11/04/2022 2042   ALKPHOS 87 11/04/2022 2042   BILITOT 0.6 11/04/2022 2042   GFRNONAA 25 (L) 11/04/2022 2042   Lab Results  Component Value Date   CHOL 163 03/05/2022   HDL 75 03/05/2022   LDLCALC 59 03/05/2022   TRIG 182 (H) 03/05/2022   CHOLHDL 2.2 03/05/2022   Lab Results  Component Value Date   HGBA1C 8.7 (H) 03/05/2022      ASSESSMENT AND PLAN  Occipital headache  Cervical facet joint syndrome  Cervical stenosis of spinal canal  Diabetic polyneuropathy associated with type 2 diabetes mellitus (HCC)  Vertigo  Neck pain     Bilateral splenius capitis trigger point injections with a total of 5 cc Marcaine containing 40 mg Depo-Medrol (dose reduced as diabetic) using sterile technique.  She tolerated the procedure well and pain was better afterwards.  Nortriptyline 10 mg (take one or two).  to help with the headache pain.  Due to CRF, needs to avoid NSAID's   Origin of her vertigo is unclear.  MRI did not show etiology.  If worsens, should see ENT Stay active and exercise as tolerated. Return prn for new or worsening neurologic symptoms.   Due to DM and osteoporosis not advised to do TPI mor ethan 2-3 times a year   Savvas Roper A. Epimenio Foot, MD, [facility] 12/18/2022, 8:44 AM Certified in Neurology, Clinical Neurophysiology, Sleep Medicine and Neuroimaging  [facility] [address] [place], [place] [address] [phone]

## 2022-12-18 ENCOUNTER — Encounter: Payer: Self-pay | Admitting: Cardiology

## 2022-12-18 ENCOUNTER — Ambulatory Visit: Payer: 59 | Attending: Cardiology | Admitting: Cardiology

## 2022-12-18 ENCOUNTER — Ambulatory Visit: Payer: 59

## 2022-12-18 VITALS — BP 142/68 | HR 72 | Ht 62.0 in | Wt 258.0 lb

## 2022-12-18 DIAGNOSIS — R55 Syncope and collapse: Secondary | ICD-10-CM

## 2022-12-18 DIAGNOSIS — R42 Dizziness and giddiness: Secondary | ICD-10-CM | POA: Diagnosis not present

## 2022-12-18 DIAGNOSIS — I25118 Atherosclerotic heart disease of native coronary artery with other forms of angina pectoris: Secondary | ICD-10-CM

## 2022-12-18 MED ORDER — NITROGLYCERIN 0.4 MG SL SUBL: 0.4000 mg | SUBLINGUAL_TABLET | SUBLINGUAL | 3 refills | Status: DC | PRN

## 2022-12-18 NOTE — Progress Notes (Signed)
Cardiology Office Note:  .   Date:  12/18/2022  ID:  Suzanne Patton, DOB 1950/11/15, MRN [id number] PCP: Irena Reichmann, DO   HeartCare Providers Cardiologist:  Truett Mainland, MD  PCP: Irena Reichmann, DO   }   History of Present Illness: .    72 y.o. Caucasian female with hypertension, hyperlipidemia, type 2 DM, CAD, SVT, bipolar disorder, OSA on CPAP, morbid obesity.  Patient denies any complaints of chest pain or shortness of breath.  She has palpitations from time to time, but no more than usual.  She has episodes of fall, unclear if there is true loss of consciousness associated with these episodes or not.  She does report lightheadedness on standing up.  She does not drink more than 1-2 quarts of water daily  On a separate note, she has not been able to see a PCP and endocrinologist due to issues related to payment balance.  Initial consultation HPI 07/2020: Patient has known CAD with PCI in 2012, has h/o SVT with ablations, all performed in [place].    Patient moved to [place] in July 2020 to be with her daughter.  Most recently, she lived in [place], although she has previously lived in several different states.  Her last visit with her cardiologist in [place] was in September 2020.  Patient has had left-sided chest pressure symptoms with physical and emotional stress, relieved with stress, for long time.  This had been stable, but increased this past winter.  Patient has 2-3 episodes a week.  She is not on any short or long-acting nitroglycerin.  Patient reports episodes of lightheadedness followed by syncope, most recent episode 3 weeks ago.  She has several other episodes of aborted syncope.  She denies any chest pain or shortness of breath prior to her syncope episode.  She is reportedly undergone extensive neurological work-up in [place], which was reportedly unyielding.   Her diabetes remains uncontrolled.  She is now established care with Dr. Irena Reichmann, with whom  she is working actively on management of her diabetes.   Vitals:   12/18/22 1108  BP: (!) 142/68  Pulse: 72  SpO2: 93%   Orthostatic VS for the past 24 hrs:  BP- Lying Pulse- Lying BP- Sitting Pulse- Sitting BP- Standing at 0 minutes Pulse- Standing at 0 minutes  12/18/22 1152 134/62 68 124/59 68 115/58 69     ROS:  Review of Systems  Cardiovascular:  Positive for palpitations. Negative for chest pain, dyspnea on exertion, leg swelling and syncope.  Musculoskeletal:  Positive for falls.  Neurological:  Positive for light-headedness and loss of balance.     Studies Reviewed: Marland Kitchen       CBC, BMP 11/04/2022. Cr 2.04  Coronary angiography 03/20/2022: LM: Normal LAD: Prox-mid 40% disease         Diag 1 40% disease Lcx: Long diffuse 70% stenosis in prox-mid Lcx        RFR 0.93        70% long disease in OM1 RCA: Patent prox-mid RCA stents with minimal 10% lumen loss   LVEDP 20 mmHg   Obesity and possible diastolic heart failure most likely cause of patient's exertional dyspnea symptoms    Physical Exam:   Physical Exam Vitals and nursing note reviewed.  Constitutional:      General: She is not in acute distress. Neck:     Vascular: No JVD.  Cardiovascular:     Rate and Rhythm: Normal rate and regular rhythm.  Heart sounds: Normal heart sounds. No murmur heard. Pulmonary:     Effort: Pulmonary effort is normal.     Breath sounds: Normal breath sounds. No wheezing or rales.  Musculoskeletal:     Right lower leg: No edema.     Left lower leg: No edema.    Visit diagnoses:   ICD-10-CM   1. Postural dizziness with presyncope  R42 LONG TERM MONITOR (3-14 DAYS)   R55     2. Coronary artery disease of native artery of native heart with stable angina pectoris (HCC)  I25.118 nitroGLYCERIN (NITROSTAT) 0.4 MG SL tablet       ASSESSMENT AND PLAN: .    72 y.o. Caucasian female with hypertension, hyperlipidemia, type 2 DM, CAD, SVT, bipolar disorder, OSA on CPAP, morbid  obesity.  Exertional dyspnea: Likely due to HFpEF. Fairly well controlled. Continue lasix 40 mg daily. Do not recommend Jardiance at this time due to renal dysfunction.   PSVT: Occasional episodes. Her episodes of postural dizziness more likely be due to orthostatic hypotension and arrhythmia.  Nonetheless, we will check 2-week Zio patch to rule out any significant arrhythmias.   Hypertension: Well-controlled   Mixed hyperlipidemia: Well-controlled on Crestor and Zetia. Crestor 20 mg daily, which has controlled lipids very well in the past.   F/u in 3 months  Signed, Elder Negus, MD

## 2022-12-18 NOTE — Patient Instructions (Addendum)
Medication Instructions:   *If you need a refill on your cardiac medications before your next appointment, please call your pharmacy*   Lab Work:  If you have labs (blood work) drawn today and your tests are completely normal, you will receive your results only by: MyChart Message (if you have MyChart) OR A paper copy in the mail If you have any lab test that is abnormal or we need to change your treatment, we will call you to review the results.   Testing/Procedures: Suzanne Patton- Long Term Monitor Instructions  Your physician has requested you wear a ZIO patch monitor for 14 days.  This is a single patch monitor. [employer] supplies one patch monitor per enrollment. Additional stickers are not available. Please do not apply patch if you will be having a Nuclear Stress Test,  Echocardiogram, Cardiac CT, MRI, or Chest Xray during the period you would be wearing the  monitor. The patch cannot be worn during these tests. You cannot remove and re-apply the  ZIO XT patch monitor.  Your ZIO patch monitor will be mailed 3 day USPS to your address on file. It may take 3-5 days  to receive your monitor after you have been enrolled.  Once you have received your monitor, please review the enclosed instructions. Your monitor  has already been registered assigning a specific monitor serial # to you.  Billing and Patient Assistance Program Information  We have supplied [employer] with any of your insurance information on file for billing purposes. [employer] offers a sliding scale Patient Assistance Program for patients that do not have  insurance, or whose insurance does not completely cover the cost of the ZIO monitor.  You must apply for the Patient Assistance Program to qualify for this discounted rate.  To apply, please call [employer] at [phone], select option 4, select option 2, ask to apply for  Patient Assistance Program. Meredeth Ide will ask your household income, and how many people  are in your  household. They will quote your out-of-pocket cost based on that information.  [employer] will also be able to set up a 58-month, interest-free payment plan if needed.  Applying the monitor   Shave hair from upper left chest.  Hold abrader disc by orange tab. Rub abrader in 40 strokes over the upper left chest as  indicated in your monitor instructions.  Clean area with 4 enclosed alcohol pads. Let dry.  Apply patch as indicated in monitor instructions. Patch will be placed under collarbone on left  side of chest with arrow pointing upward.  Rub patch adhesive wings for 2 minutes. Remove white label marked "1". Remove the white  label marked "2". Rub patch adhesive wings for 2 additional minutes.  While looking in a mirror, press and release button in center of patch. A small green light will  flash 3-4 times. This will be your only indicator that the monitor has been turned on.  Do not shower for the first 24 hours. You may shower after the first 24 hours.  Press the button if you feel a symptom. You will hear a small click. Record Date, Time and  Symptom in the Patient Logbook.  When you are ready to remove the patch, follow instructions on the last 2 pages of Patient  Logbook. Stick patch monitor onto the last page of Patient Logbook.  Place Patient Logbook in the blue and white box. Use locking tab on box and tape box closed  securely. The blue and white box has prepaid  postage on it. Please place it in the mailbox as  soon as possible. Your physician should have your test results approximately 7 days after the  monitor has been mailed back to [facility].  Call [facility] Customer Care at [phone] if you have questions regarding  your ZIO XT patch monitor. Call them immediately if you see an orange light blinking on your  monitor.  If your monitor falls off in less than 4 days, contact our Monitor department at [phone].  If your monitor becomes loose or falls off after  4 days call [employer] at [phone] for  suggestions on securing your monitor    Follow-Up: At [facility], you and your health needs are our priority.  As part of our continuing mission to provide you with exceptional heart care, we have created designated Provider Care Teams.  These Care Teams include your primary Cardiologist (physician) and Advanced Practice Providers (APPs -  Physician Assistants and Nurse Practitioners) who all work together to provide you with the care you need, when you need it.  We recommend signing up for the patient portal called "MyChart".  Sign up information is provided on this After Visit Summary.  MyChart is used to connect with patients for Virtual Visits (Telemedicine).  Patients are able to view lab/test results, encounter notes, upcoming appointments, etc.  Non-urgent messages can be sent to your provider as well.   To learn more about what you can do with MyChart, go to [email or web address].    Your next appointment:   3 month(s)  DR [facility]    Other Instructions

## 2022-12-18 NOTE — Progress Notes (Unsigned)
Enrolled for [employer] to mail a ZIO XT long term holter monitor to the patients address on file.  

## 2023-01-01 DIAGNOSIS — E113493 Type 2 diabetes mellitus with severe nonproliferative diabetic retinopathy without macular edema, bilateral: Secondary | ICD-10-CM | POA: Diagnosis not present

## 2023-01-02 ENCOUNTER — Ambulatory Visit: Payer: 59 | Admitting: Neurology

## 2023-01-07 ENCOUNTER — Other Ambulatory Visit: Payer: Self-pay | Admitting: Cardiology

## 2023-01-07 DIAGNOSIS — I25118 Atherosclerotic heart disease of native coronary artery with other forms of angina pectoris: Secondary | ICD-10-CM

## 2023-01-08 DIAGNOSIS — Z79899 Other long term (current) drug therapy: Secondary | ICD-10-CM | POA: Diagnosis not present

## 2023-01-08 DIAGNOSIS — Z794 Long term (current) use of insulin: Secondary | ICD-10-CM | POA: Diagnosis not present

## 2023-01-08 DIAGNOSIS — R002 Palpitations: Secondary | ICD-10-CM | POA: Diagnosis not present

## 2023-01-08 DIAGNOSIS — E1169 Type 2 diabetes mellitus with other specified complication: Secondary | ICD-10-CM | POA: Diagnosis not present

## 2023-01-08 DIAGNOSIS — E782 Mixed hyperlipidemia: Secondary | ICD-10-CM | POA: Diagnosis not present

## 2023-01-08 DIAGNOSIS — Z0000 Encounter for general adult medical examination without abnormal findings: Secondary | ICD-10-CM | POA: Diagnosis not present

## 2023-01-08 DIAGNOSIS — E118 Type 2 diabetes mellitus with unspecified complications: Secondary | ICD-10-CM | POA: Diagnosis not present

## 2023-01-08 DIAGNOSIS — Z23 Encounter for immunization: Secondary | ICD-10-CM | POA: Diagnosis not present

## 2023-01-08 DIAGNOSIS — I10 Essential (primary) hypertension: Secondary | ICD-10-CM | POA: Diagnosis not present

## 2023-01-08 DIAGNOSIS — M130 Polyarthritis, unspecified: Secondary | ICD-10-CM | POA: Diagnosis not present

## 2023-01-08 DIAGNOSIS — I5032 Chronic diastolic (congestive) heart failure: Secondary | ICD-10-CM | POA: Diagnosis not present

## 2023-01-08 NOTE — Telephone Encounter (Signed)
Called pt to clarify what dose she is taking and ideally titrate it due to elevated A1c. Phone went straight to voicemail.

## 2023-01-09 NOTE — Telephone Encounter (Signed)
Called pt again, straight to VM, left message.

## 2023-01-10 NOTE — Telephone Encounter (Signed)
Per Dr. Rosemary Holms last note, pt was working with PCP for DM. Can't tell if we are supposed to be still rx and what dose she should be on. LVM - phone went right to VM

## 2023-01-10 NOTE — Telephone Encounter (Signed)
Patient called back. She is taking taking ozmepic. Stomach couldn't handle it. She will now be seeing Program[facility] systems at [facility] as PCP. She also said she will start her zio patch today.

## 2023-01-15 DIAGNOSIS — E113491 Type 2 diabetes mellitus with severe nonproliferative diabetic retinopathy without macular edema, right eye: Secondary | ICD-10-CM | POA: Diagnosis not present

## 2023-01-15 DIAGNOSIS — E113412 Type 2 diabetes mellitus with severe nonproliferative diabetic retinopathy with macular edema, left eye: Secondary | ICD-10-CM | POA: Diagnosis not present

## 2023-01-25 ENCOUNTER — Encounter: Payer: Self-pay | Admitting: Podiatry

## 2023-01-25 ENCOUNTER — Ambulatory Visit (INDEPENDENT_AMBULATORY_CARE_PROVIDER_SITE_OTHER): Payer: 59 | Admitting: Podiatry

## 2023-01-25 DIAGNOSIS — B351 Tinea unguium: Secondary | ICD-10-CM

## 2023-01-25 DIAGNOSIS — M79675 Pain in left toe(s): Secondary | ICD-10-CM

## 2023-01-25 DIAGNOSIS — M79674 Pain in right toe(s): Secondary | ICD-10-CM | POA: Diagnosis not present

## 2023-01-25 DIAGNOSIS — E1142 Type 2 diabetes mellitus with diabetic polyneuropathy: Secondary | ICD-10-CM

## 2023-01-25 NOTE — Progress Notes (Signed)
  Subjective:  Patient ID: Suzanne Patton, female    DOB: 1951-02-24,  MRN: [id number]  Chief Complaint  Patient presents with   Diabetes    PATIENT STATES THAT HER FEET HAVE BEEN DOING ABOUT THE SAME , HER FEET ARE ALWAYS NUM SHE STATES . PATIENT STATES SHE TAKES MEDICATION FOR PAIN .    72 y.o. female presents with the above complaint. History confirmed with patient. Patient presenting with pain related to dystrophic thickened elongated nails. Patient is unable to trim own nails related to nail dystrophy and/or mobility issues. Patient does have a history of T2DM.  Patient hoping to get new diabetic shoes at this exam.  She does have a some numbness in both feet.  She is having difficulty trimming nails.  Objective:  Physical Exam: warm, good capillary refill nail exam onychomycosis of the toenails, onycholysis, and dystrophic nails DP pulses palpable, PT pulses palpable, and protective sensation absent Left Foot:  Pain with palpation of nails due to elongation and dystrophic growth. Hammertoe deformity of the lesser digits 2 through 5 Right Foot: Pain with palpation of nails due to elongation and dystrophic growth.  Hallux malleus of the right foot.  Hammertoe deformity of the lesser digits 2 through 5  Assessment:   1. Pain due to onychomycosis of toenails of both feet   2. DM type 2 with diabetic peripheral neuropathy (HCC)         Plan:  Patient was evaluated and treated and all questions answered.  #Onychomycosis with pain  -Nails palliatively debrided as below. -Educated on self-care  Procedure: Nail Debridement Rationale: Pain Type of Debridement: manual, sharp debridement. Instrumentation: Nail nipper, rotary burr. Number of Nails: 10  No follow-ups on file.         Corinna Gab, DPM [facility]

## 2023-02-05 ENCOUNTER — Other Ambulatory Visit: Payer: Self-pay | Admitting: Neurology

## 2023-02-05 ENCOUNTER — Other Ambulatory Visit: Payer: Self-pay

## 2023-02-06 NOTE — Telephone Encounter (Signed)
Last seen on 12/17/22 No follow up scheduled

## 2023-02-07 ENCOUNTER — Other Ambulatory Visit: Payer: Self-pay | Admitting: Cardiology

## 2023-02-07 DIAGNOSIS — I5032 Chronic diastolic (congestive) heart failure: Secondary | ICD-10-CM

## 2023-02-07 DIAGNOSIS — I25118 Atherosclerotic heart disease of native coronary artery with other forms of angina pectoris: Secondary | ICD-10-CM

## 2023-02-07 DIAGNOSIS — I10 Essential (primary) hypertension: Secondary | ICD-10-CM

## 2023-03-06 ENCOUNTER — Encounter (HOSPITAL_BASED_OUTPATIENT_CLINIC_OR_DEPARTMENT_OTHER): Payer: Self-pay | Admitting: Pulmonary Disease

## 2023-03-06 ENCOUNTER — Ambulatory Visit (INDEPENDENT_AMBULATORY_CARE_PROVIDER_SITE_OTHER): Payer: 59 | Admitting: Pulmonary Disease

## 2023-03-06 VITALS — BP 128/58 | HR 62 | Resp 16 | Ht 62.0 in | Wt 267.6 lb

## 2023-03-06 DIAGNOSIS — R0609 Other forms of dyspnea: Secondary | ICD-10-CM | POA: Diagnosis not present

## 2023-03-06 NOTE — Progress Notes (Signed)
Subjective:    Patient ID: Suzanne Patton, female    DOB: 10-12-1950, 72 y.o.   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  History of Present Illness   Miss Suzanne Patton, a patient with a history of sleep apnea, congestive heart failure, diabetes, and osteoarthritis, presents with worsening shortness of breath. She reports that her shortness of breath has worsened to the point where she becomes breathless after walking short distances within her home. Despite multiple tests, including a pulmonary function test and cardiac tests, the cause of her shortness of breath remains unclear. She also reports dizziness and episodes of passing out, which have not been explained by previous tests.  The patient has been struggling with weight gain, despite dietary changes. She reports a weight increase from 240-245 pounds to 267 pounds. She has had COVID-19 five times, with the last infection occurring over a year ago.  She also has degenerative bone disease and osteoarthritis, which necessitates the use of a walker. She reports pain in her knees and shoulders, and arthritis in her lower back. She also mentions having a dry mouth, which has been an issue for the past year.  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 Her father had mesothelioma, asbestosis, COPD, and congestive heart failure. She is currently on multiple medications, including Lasix, which she takes as needed for perceived fluid retention.      Chest x-ray 11/06/2022 and 10/05/2021 did not show any infiltrates or effusions On ambulation oxygen saturation stayed between 93 to 95%, she walks slowly and  was limited by hip pain but was able to complete 3 laps  Significant tests/ events reviewed  04/2022 PFT normal spirometry, no bronchodilator response, normal lung volumes with the exception of severely reduced ERV just by body habitus, normal DLCO.   TTE 02/2022  left atrial dilation, moderate AI, mild MR, grade 1 diastolic function, right side looks okay.  Left heart catheterization 03/20/2022 with LVEDP of 20, nonobstructive coronary disease.   CTA chest 9/020 no ILD  Review of Systems neg for any significant sore throat, dysphagia, itching, sneezing, nasal congestion or excess/ purulent secretions, fever, chills, sweats, unintended wt loss, pleuritic or exertional cp, hempoptysis, orthopnea pnd or change in chronic leg swelling. Also denies presyncope, palpitations, heartburn, abdominal pain, nausea, vomiting, diarrhea or change in bowel or urinary habits, dysuria,hematuria, rash, arthralgias, visual complaints, headache, numbness weakness or ataxia.     Objective:   Physical Exam  Gen. Pleasant, obese, in no distress, normal affect ENT - no pallor,icterus, no post nasal drip, class 2-3 airway Neck: No JVD, no thyromegaly, no carotid bruits Lungs: no use of accessory muscles, no dullness to percussion, decreased without rales or rhonchi  Cardiovascular: Rhythm regular, heart sounds  normal, no murmurs or gallops, no peripheral edema Abdomen: soft and non-tender, no hepatosplenomegaly, BS normal. Musculoskeletal: No deformities, no cyanosis or clubbing Neuro:  alert, non focal, no tremors       Assessment & Plan:      Assessment and Plan -dyspnea has been ongoing for a while, at this point likely to attributed to deconditioning, obesity and weight gain,  likely related to limited mobility due to Severe arthritis and hip pain.  This is caused her to be in a vicious cycle of weight gain and deconditioning.  She does not desaturate on exertion.  Unlikely to be VTE and no parenchymal  disease or pulmonary hypertension on prior testing.  Will check D-dimer for completion.  She is unable to participate in a rehab program and does not have finances or transportation    Shortness of Breath Chronic shortness of breath, worsening over time. Pulmonary function tests, cardiac evaluations, and imaging studies have been normal. Differential diagnosis includes deconditioning, weight gain, and potential fluid overload. Mild congestive heart failure and multiple episodes of COVID-19 may contribute to symptoms. Previous use of Lasix did not result in significant weight loss or symptom improvement. Anxiety may also contribute to symptoms. - Recommend Lasix daily for 3-5 days to reduce fluid weight and assess for improvement in breathing - Order blood work to rule out blood clots - Consider referral to a Child psychotherapist for subsidized pool exercise programs  Congestive Heart Failure (mild) Mild congestive heart failure with occasional fluid retention. Currently managing with as-needed Lasix. Emphasized daily monitoring of weight and symptoms to guide Lasix use. - Continue as-needed Lasix with daily monitoring of weight and symptoms - Reassess if symptoms worsen or significant weight gain occurs  Obesity Significant weight gain over the past year, currently weighing 267 lbs. Unable to lose weight despite dietary changes. Weight management is crucial for alleviating shortness of breath. - Encourage weight loss through dietary modifications and exercise programs - Discuss the importance of weight management in alleviating shortness of breath  Osteoarthritis and Degenerative Bone Disease Severe osteoarthritis and degenerative bone disease affecting knees, shoulders, and lower back. Uses a walker due to dizziness and pain. Pool therapy may provide low-impact exercise, but financial constraints are a challenge. - Consider referral to a physical therapist for a tailored exercise program - Discuss  potential benefits of pool therapy for low-impact exercise - Evaluate pain management strategies  Diabetes Mellitus Long-standing diabetes, reportedly well-controlled. Concerned about the number of medications. 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 Multiple COVID-19 infections. Discussed the importance of CPAP hygiene to prevent infections. - Advise on maintaining CPAP hygiene to prevent infections - Encourage regular follow-up with primary care provider for comprehensive health management  Follow-up - Schedule follow-up appointment to review blood work results and reassess symptoms - Check with social worker for potential subsidized exercise programs.

## 2023-03-06 NOTE — Patient Instructions (Signed)
Take lasix daily for 5 days or until wt drops by 5 lbs  X Amb sat  X check d-dimer for blood clots  Social work consult for [employer]

## 2023-03-07 ENCOUNTER — Telehealth (HOSPITAL_BASED_OUTPATIENT_CLINIC_OR_DEPARTMENT_OTHER): Payer: Self-pay | Admitting: Pulmonary Disease

## 2023-03-07 DIAGNOSIS — R0609 Other forms of dyspnea: Secondary | ICD-10-CM

## 2023-03-07 LAB — D-DIMER, QUANTITATIVE: D-DIMER: 5.31 mg{FEU}/L — ABNORMAL HIGH (ref 0.00–0.49)

## 2023-03-07 NOTE — Telephone Encounter (Signed)
Patient calling regarding some lab results from yesterday that were concerning to her. Please advise and call patient back.

## 2023-03-07 NOTE — Telephone Encounter (Signed)
Scheduled for Monday 12/23 at 11am   Left pt a vm about appt info   Closing encounter

## 2023-03-07 NOTE — Telephone Encounter (Signed)
Order changed to NM perf/particulate per protocol

## 2023-03-07 NOTE — Telephone Encounter (Signed)
I tried to call her but unable to reach. D-dimer is high.  This raises concern for blood clots.  Ideally we would perform a CT angiogram chest to look for blood clots in her lungs as a cause of her shortness of breath, but the IV dye that we use can hurt the kidneys.  She does have some renal insufficiency with creatinine of 2 so we cannot perform this test.  I would suggest that we proceed with VQ scan to look for pulmonary emboli I have ordered, please ask [facility] to expedite

## 2023-03-07 NOTE — Telephone Encounter (Signed)
Patient would also like to remain with Dr. Vassie Loll for any future follow ups due to location. Please advise if that is okay

## 2023-03-07 NOTE — Telephone Encounter (Signed)
Per scheduling, order needs to be changed to perf and particulate  And also needs Chest X ray order   Order should be "NM perf and particulate" [id number]   Please advise so I can sch.

## 2023-03-07 NOTE — Telephone Encounter (Signed)
Pt called back and is aware of appt

## 2023-03-07 NOTE — Telephone Encounter (Signed)
Results have been relayed to the patient/authorized caretaker. The patient/authorized caretaker verbalized understanding. No questions at this time.  [facility] please expedite per Dr Vassie Loll.

## 2023-03-10 ENCOUNTER — Encounter (HOSPITAL_COMMUNITY): Payer: Self-pay

## 2023-03-10 ENCOUNTER — Other Ambulatory Visit: Payer: Self-pay

## 2023-03-10 ENCOUNTER — Emergency Department (HOSPITAL_COMMUNITY)
Admission: EM | Admit: 2023-03-10 | Discharge: 2023-03-10 | Disposition: A | Discharge status: Home / Self Care | Payer: 59 | Attending: Emergency Medicine | Admitting: Emergency Medicine

## 2023-03-10 ENCOUNTER — Other Ambulatory Visit: Payer: Self-pay | Admitting: Medical Genetics

## 2023-03-10 DIAGNOSIS — T40601A Poisoning by unspecified narcotics, accidental (unintentional), initial encounter: Secondary | ICD-10-CM

## 2023-03-10 DIAGNOSIS — Z7984 Long term (current) use of oral hypoglycemic drugs: Secondary | ICD-10-CM | POA: Insufficient documentation

## 2023-03-10 DIAGNOSIS — T402X1A Poisoning by other opioids, accidental (unintentional), initial encounter: Secondary | ICD-10-CM | POA: Diagnosis not present

## 2023-03-10 DIAGNOSIS — X58XXXA Exposure to other specified factors, initial encounter: Secondary | ICD-10-CM | POA: Insufficient documentation

## 2023-03-10 DIAGNOSIS — I129 Hypertensive chronic kidney disease with stage 1 through stage 4 chronic kidney disease, or unspecified chronic kidney disease: Secondary | ICD-10-CM | POA: Insufficient documentation

## 2023-03-10 DIAGNOSIS — I2510 Atherosclerotic heart disease of native coronary artery without angina pectoris: Secondary | ICD-10-CM | POA: Insufficient documentation

## 2023-03-10 DIAGNOSIS — Z79899 Other long term (current) drug therapy: Secondary | ICD-10-CM | POA: Insufficient documentation

## 2023-03-10 DIAGNOSIS — Z7982 Long term (current) use of aspirin: Secondary | ICD-10-CM | POA: Insufficient documentation

## 2023-03-10 DIAGNOSIS — N183 Chronic kidney disease, stage 3 unspecified: Secondary | ICD-10-CM | POA: Diagnosis not present

## 2023-03-10 DIAGNOSIS — Z794 Long term (current) use of insulin: Secondary | ICD-10-CM | POA: Diagnosis not present

## 2023-03-10 DIAGNOSIS — T50901A Poisoning by unspecified drugs, medicaments and biological substances, accidental (unintentional), initial encounter: Secondary | ICD-10-CM | POA: Diagnosis present

## 2023-03-10 DIAGNOSIS — E119 Type 2 diabetes mellitus without complications: Secondary | ICD-10-CM | POA: Diagnosis not present

## 2023-03-10 LAB — CBC WITH DIFFERENTIAL/PLATELET
Abs Immature Granulocytes: 0.01 10*3/uL (ref 0.00–0.07)
Basophils Absolute: 0 10*3/uL (ref 0.0–0.1)
Basophils Relative: 1 %
Eosinophils Absolute: 0.1 10*3/uL (ref 0.0–0.5)
Eosinophils Relative: 2 %
HCT: 36 % (ref 36.0–46.0)
Hemoglobin: 11 g/dL — ABNORMAL LOW (ref 12.0–15.0)
Immature Granulocytes: 0 %
Lymphocytes Relative: 28 %
Lymphs Abs: 1.7 10*3/uL (ref 0.7–4.0)
MCH: 27.1 pg (ref 26.0–34.0)
MCHC: 30.6 g/dL (ref 30.0–36.0)
MCV: 88.7 fL (ref 80.0–100.0)
Monocytes Absolute: 0.5 10*3/uL (ref 0.1–1.0)
Monocytes Relative: 8 %
Neutro Abs: 3.6 10*3/uL (ref 1.7–7.7)
Neutrophils Relative %: 61 %
Platelets: 241 10*3/uL (ref 150–400)
RBC: 4.06 MIL/uL (ref 3.87–5.11)
RDW: 14.6 % (ref 11.5–15.5)
WBC: 5.9 10*3/uL (ref 4.0–10.5)
nRBC: 0 % (ref 0.0–0.2)

## 2023-03-10 LAB — COMPREHENSIVE METABOLIC PANEL
ALT: 13 U/L (ref 0–44)
AST: 16 U/L (ref 15–41)
Albumin: 3.5 g/dL (ref 3.5–5.0)
Alkaline Phosphatase: 78 U/L (ref 38–126)
Anion gap: 8 (ref 5–15)
BUN: 24 mg/dL — ABNORMAL HIGH (ref 8–23)
CO2: 31 mmol/L (ref 22–32)
Calcium: 9.2 mg/dL (ref 8.9–10.3)
Chloride: 102 mmol/L (ref 98–111)
Creatinine, Ser: 1.57 mg/dL — ABNORMAL HIGH (ref 0.44–1.00)
GFR, Estimated: 35 mL/min — ABNORMAL LOW (ref >60)
Glucose, Bld: 50 mg/dL — ABNORMAL LOW (ref 70–99)
Potassium: 4.2 mmol/L (ref 3.5–5.1)
Sodium: 141 mmol/L (ref 135–145)
Total Bilirubin: 0.6 mg/dL (ref <1.2)
Total Protein: 6.3 g/dL — ABNORMAL LOW (ref 6.5–8.1)

## 2023-03-10 LAB — CBG MONITORING, ED
Glucose-Capillary: 116 mg/dL — ABNORMAL HIGH (ref 70–99)
Glucose-Capillary: 187 mg/dL — ABNORMAL HIGH (ref 70–99)

## 2023-03-10 LAB — ACETAMINOPHEN LEVEL: Acetaminophen (Tylenol), Serum: <10 ug/mL — ABNORMAL LOW (ref 10–30)

## 2023-03-10 LAB — SALICYLATE LEVEL: Salicylate Lvl: <7 mg/dL — ABNORMAL LOW (ref 7.0–30.0)

## 2023-03-10 NOTE — ED Provider Notes (Signed)
[place] EMERGENCY DEPARTMENT AT [facility] Provider Note   CSN: [id number] Arrival date & time: 03/10/23  1030     History  Chief Complaint  Patient presents with   Drug Overdose    Suzanne Patton is a 72 y.o. female.  Patient is a 72 year old female with a history of hypertension, hyperlipidemia, diabetes, stage III kidney disease, CAD, PVD who is presenting today due to accidentally taking too much of her Norco 7.5 mg tablets.  She reports that she had 2 pill bottles sitting on her table and she thought she was taking her normal morning medications because the bottles were next to each other.  After she swallowed the pill she looked and realize she had taken 7-8 of the Norco tablets by accident.  This was approximately 45 minutes ago.  She denies there being any other medication in the bottle.  She was not trying to harm herself.  She currently denies any complaints except for having a dry mouth.  She did try to make herself vomit but denies any thing coming up.  She denies feeling drowsy at this time.  The history is provided by the patient.       Home Medications Prior to Admission medications   Medication Sig Start Date End Date Taking? Authorizing Provider  albuterol (VENTOLIN HFA) 108 (90 Base) MCG/ACT inhaler Inhale 2 puffs into the lungs at bedtime as needed for wheezing or shortness of breath. 08/16/20   [provider]  aspirin 81 MG EC tablet Take 81 mg by mouth at bedtime.    [provider]  Blood Glucose Monitoring Suppl (ONE TOUCH ULTRA 2) w/Device KIT daily. 03/29/22   [provider]  CALCIUM CITRATE PO Take 4 tablets by mouth at bedtime.    [provider]  citalopram (CELEXA) 40 MG tablet Take 40 mg by mouth at bedtime. 03/01/20   [provider]  clonazePAM (KLONOPIN) 1 MG tablet Take 1 mg by mouth at bedtime. 07/10/22   [provider]  diclofenac Sodium (VOLTAREN) 1 % GEL Apply 2 g topically daily as  needed (pain).    [provider]  diltiazem (CARDIZEM CD) 120 MG 24 hr capsule TAKE 1 CAPSULE (120 MG TOTAL) BY MOUTH EVERY MORNING 02/07/23   Patwardhan, Manish J, MD  ezetimibe (ZETIA) 10 MG tablet Take 10 mg by mouth every morning. 06/23/20   [provider]  furosemide (LASIX) 40 MG tablet TAKE 1 TABLET BY MOUTH EVERY DAY 02/07/23   Patwardhan, Manish J, MD  GVOKE HYPOPEN 2-PACK 1 MG/0.2ML SOAJ Take 1 mg by mouth as needed (Low blood glucose).    [provider]  HYDROcodone-acetaminophen (NORCO) 7.5-325 MG tablet Take 1 tablet by mouth every 6 (six) hours as needed for severe pain.    [provider]  insulin detemir (LEVEMIR) 100 UNIT/ML injection Inject 60 Units into the skin 2 (two) times daily.    [provider]  insulin lispro (HUMALOG) 100 UNIT/ML injection Inject 10-20 Units into the skin See admin instructions. Inject 10-20 units subcutaneously up to 3 times daily - sliding scale is based on CBG 01/16/20   [provider]  labetalol (NORMODYNE) 300 MG tablet Take 300 mg by mouth in the morning and at bedtime.    [provider]  lamoTRIgine (LAMICTAL) 200 MG tablet Take 200 mg by mouth 2 (two) times daily. 06/27/20   [provider]  Lancets (ONETOUCH ULTRASOFT) lancets TEST 3 TIMES A DAY AS DIRECTED  06/25/22   [provider]  levocetirizine (XYZAL) 5 MG tablet Take 5 mg by mouth at bedtime.    [provider]  losartan (COZAAR) 25 MG tablet Take 1 tablet (25 mg total) by mouth daily. 02/07/23 08/06/23  Patwardhan, Anabel Bene, MD  methocarbamol (ROBAXIN) 500 MG tablet Take 500 mg by mouth daily as needed. 08/01/22   [provider]  nitroGLYCERIN (NITROSTAT) 0.4 MG SL tablet Place 1 tablet (0.4 mg total) under the tongue every 5 (five) minutes as needed for chest pain. 12/18/22   Patwardhan, Anabel Bene, MD  nortriptyline (PAMELOR) 10 MG capsule TAKE ONE OR TWO CAPSULES BY MOUTH AT BEDTIME 02/06/23    Sater, Pearletha Furl, MD  Omega-3 Fatty Acids (FISH OIL OMEGA-3) 1000 MG CAPS Take 1,000 mg by mouth at bedtime.    [provider]  omeprazole (PRILOSEC) 20 MG capsule Take 20 mg by mouth every morning. 06/23/20   [provider]  [facility] ULTRA test strip 3 (three) times daily. for testing 07/27/22   [provider]  rosuvastatin (CRESTOR) 20 MG tablet Take 1 tablet (20 mg total) by mouth daily. 08/10/22 11/08/22  Patwardhan, Anabel Bene, MD  sodium chloride (OCEAN) 0.65 % nasal spray Place 2 sprays into the nose at bedtime. [place]    [provider]      Allergies    Dulaglutide, Semaglutide(0.25 or 0.5mg -dos), Meclizine, Other, Prednisone, and Quinolones    Review of Systems   Review of Systems  Physical Exam Updated Vital Signs BP 139/76 (BP Location: Left Arm)   Pulse (!) 59   Temp 98.2 F (36.8 C) (Oral)   Resp 15   SpO2 93%  Physical Exam Vitals and nursing note reviewed.  Constitutional:      General: She is not in acute distress.    Appearance: She is well-developed.  HENT:     Head: Normocephalic and atraumatic.     Mouth/Throat:     Mouth: Mucous membranes are dry.  Eyes:     Pupils: Pupils are equal, round, and reactive to light.     Comments: Pupils are 3 mm and reactive  Cardiovascular:     Rate and Rhythm: Normal rate and regular rhythm.     Heart sounds: Normal heart sounds. No murmur heard.    No friction rub.  Pulmonary:     Effort: Pulmonary effort is normal.     Breath sounds: Normal breath sounds. No wheezing or rales.  Abdominal:     General: Bowel sounds are normal. There is no distension.     Palpations: Abdomen is soft.     Tenderness: There is no abdominal tenderness. There is no guarding or rebound.  Musculoskeletal:        General: No tenderness. Normal range of motion.     Comments: No edema  Skin:    General: Skin is warm and dry.     Findings: No rash.  Neurological:     Mental Status: She is alert and oriented  to person, place, and time.     Cranial Nerves: No cranial nerve deficit.  Psychiatric:        Behavior: Behavior normal.     ED Results / Procedures / Treatments   Labs (all labs ordered are listed, but only abnormal results are displayed) Labs Reviewed  COMPREHENSIVE METABOLIC PANEL - Abnormal; Notable for the following components:      Result Value   Glucose, Bld 50 (*)    BUN 24 (*)  Creatinine, Ser 1.57 (*)    Total Protein 6.3 (*)    GFR, Estimated 35 (*)    All other components within normal limits  CBC WITH DIFFERENTIAL/PLATELET - Abnormal; Notable for the following components:   Hemoglobin 11.0 (*)    All other components within normal limits  CBG MONITORING, ED - Abnormal; Notable for the following components:   Glucose-Capillary 116 (*)    All other components within normal limits  ACETAMINOPHEN LEVEL  SALICYLATE LEVEL    EKG EKG Interpretation Date/Time:  Sunday March 10 2023 12:19:26 EST Ventricular Rate:  63 PR Interval:  214 QRS Duration:  98 QT Interval:  476 QTC Calculation: 487 R Axis:   34  Text Interpretation: Sinus rhythm with 1st degree A-V block Otherwise normal ECG When compared with ECG of 02-Nov-2022 18:21, PREVIOUS ECG IS PRESENT Confirmed by Gwyneth Sprout ([id number]) on 03/10/2023 1:07:53 PM  Radiology No results found.  Procedures Procedures    Medications Ordered in ED Medications - No data to display  ED Course/ Medical Decision Making/ A&P                                 Medical Decision Making Amount and/or Complexity of Data Reviewed Labs: ordered. Decision-making details documented in ED Course. ECG/medicine tests: ordered and independent interpretation performed. Decision-making details documented in ED Course.   Pt with multiple medical problems and comorbidities and presenting today with a complaint that caries a high risk for morbidity and mortality.  Here today with an accidental overdose on Norco.  She thought  she was taking her normal morning medications and grabbed the wrong bottle.  Patient currently is awake and alert without evidence of respiratory depression.  Based on the amount of pills she took this was not a Tylenol overdose as it would have only been 2600 mg of Tylenol and the pill she took.  Will need to monitor the patient to ensure no respiratory depression related to the medication.  Will touch base with poison control.  2:54 PM I independently entered the patient's labs and CBC without acute findings, CMP with stable creatinine of 1.57 which is patient's baseline but a blood sugar of 50.  She reports she had not had anything to eat this morning and was given a sandwich and some juice.  2:54 PM Repeat blood sugar was 116.  Pt still lucid.  Will check 4 hour tylenol level and salicylate but o/w if that is normal feel pt is stable for d/c back home.         Final Clinical Impression(s) / ED Diagnoses Final diagnoses:  Opiate overdose, accidental or unintentional, initial encounter [facility])    Rx / DC Orders ED Discharge Orders     None         Gwyneth Sprout, MD 03/10/23 1454

## 2023-03-10 NOTE — ED Provider Notes (Signed)
Patient care was taken over from Dr. Anitra Lauth.  Patient had presented because she was concerned that she took 8 tablets of her Norco rather than taking her regular morning medications.  However I am now doubting that this is the case.  She has not had any symptoms related to this.  Her Tylenol level is negative.  Other labs are nonconcerning.  She did have some hypoglycemia on arrival but this has resolved after she ate.  This was rechecked just prior to discharge her glucose was slightly elevated.  She is completely at baseline.  She has a been observed for 5 hours without symptoms.  She was discharged home in good condition.  Return precautions were given.   Rolan Bucco, MD 03/10/23 [phone]

## 2023-03-10 NOTE — ED Triage Notes (Signed)
Pt BIB [facility] from home because she accidentally took the wrong meds. Pt took 7-8 pills of hydrocodone. No compliant at this time. VSS.

## 2023-03-11 ENCOUNTER — Observation Stay (HOSPITAL_COMMUNITY)
Admission: EM | Admit: 2023-03-11 | Discharge: 2023-03-12 | Disposition: A | Discharge status: Home / Self Care | Payer: 59 | Attending: Family Medicine | Admitting: Family Medicine

## 2023-03-11 ENCOUNTER — Encounter (HOSPITAL_COMMUNITY): Payer: Self-pay

## 2023-03-11 ENCOUNTER — Other Ambulatory Visit: Payer: Self-pay

## 2023-03-11 ENCOUNTER — Telehealth: Payer: Self-pay | Admitting: Pulmonary Disease

## 2023-03-11 ENCOUNTER — Ambulatory Visit (HOSPITAL_COMMUNITY)
Admission: RE | Admit: 2023-03-11 | Discharge: 2023-03-11 | Disposition: A | Discharge status: Home / Self Care | Payer: 59 | Source: Ambulatory Visit | Attending: Pulmonary Disease | Admitting: Pulmonary Disease

## 2023-03-11 DIAGNOSIS — Z8679 Personal history of other diseases of the circulatory system: Secondary | ICD-10-CM | POA: Insufficient documentation

## 2023-03-11 DIAGNOSIS — R0609 Other forms of dyspnea: Secondary | ICD-10-CM | POA: Insufficient documentation

## 2023-03-11 DIAGNOSIS — Z794 Long term (current) use of insulin: Secondary | ICD-10-CM | POA: Insufficient documentation

## 2023-03-11 DIAGNOSIS — I2699 Other pulmonary embolism without acute cor pulmonale: Principal | ICD-10-CM | POA: Diagnosis present

## 2023-03-11 DIAGNOSIS — E1122 Type 2 diabetes mellitus with diabetic chronic kidney disease: Secondary | ICD-10-CM | POA: Insufficient documentation

## 2023-03-11 DIAGNOSIS — I129 Hypertensive chronic kidney disease with stage 1 through stage 4 chronic kidney disease, or unspecified chronic kidney disease: Secondary | ICD-10-CM | POA: Diagnosis not present

## 2023-03-11 DIAGNOSIS — Z79899 Other long term (current) drug therapy: Secondary | ICD-10-CM | POA: Insufficient documentation

## 2023-03-11 DIAGNOSIS — M545 Low back pain, unspecified: Secondary | ICD-10-CM | POA: Insufficient documentation

## 2023-03-11 DIAGNOSIS — F32A Depression, unspecified: Secondary | ICD-10-CM | POA: Insufficient documentation

## 2023-03-11 DIAGNOSIS — I2694 Multiple subsegmental pulmonary emboli without acute cor pulmonale: Secondary | ICD-10-CM

## 2023-03-11 DIAGNOSIS — Z955 Presence of coronary angioplasty implant and graft: Secondary | ICD-10-CM | POA: Diagnosis not present

## 2023-03-11 DIAGNOSIS — I2510 Atherosclerotic heart disease of native coronary artery without angina pectoris: Secondary | ICD-10-CM | POA: Diagnosis not present

## 2023-03-11 DIAGNOSIS — Z7901 Long term (current) use of anticoagulants: Secondary | ICD-10-CM | POA: Insufficient documentation

## 2023-03-11 DIAGNOSIS — E785 Hyperlipidemia, unspecified: Secondary | ICD-10-CM | POA: Insufficient documentation

## 2023-03-11 DIAGNOSIS — N1832 Chronic kidney disease, stage 3b: Secondary | ICD-10-CM | POA: Insufficient documentation

## 2023-03-11 DIAGNOSIS — R0602 Shortness of breath: Secondary | ICD-10-CM | POA: Diagnosis present

## 2023-03-11 LAB — COMPREHENSIVE METABOLIC PANEL
ALT: 12 U/L (ref 0–44)
AST: 17 U/L (ref 15–41)
Albumin: 3.9 g/dL (ref 3.5–5.0)
Alkaline Phosphatase: 79 U/L (ref 38–126)
Anion gap: 7 (ref 5–15)
BUN: 21 mg/dL (ref 8–23)
CO2: 28 mmol/L (ref 22–32)
Calcium: 9.1 mg/dL (ref 8.9–10.3)
Chloride: 101 mmol/L (ref 98–111)
Creatinine, Ser: 1.51 mg/dL — ABNORMAL HIGH (ref 0.44–1.00)
GFR, Estimated: 36 mL/min — ABNORMAL LOW (ref >60)
Glucose, Bld: 124 mg/dL — ABNORMAL HIGH (ref 70–99)
Potassium: 4.3 mmol/L (ref 3.5–5.1)
Sodium: 136 mmol/L (ref 135–145)
Total Bilirubin: 0.2 mg/dL (ref <1.2)
Total Protein: 6.5 g/dL (ref 6.5–8.1)

## 2023-03-11 LAB — CBC WITH DIFFERENTIAL/PLATELET
Abs Immature Granulocytes: 0.02 10*3/uL (ref 0.00–0.07)
Basophils Absolute: 0 10*3/uL (ref 0.0–0.1)
Basophils Relative: 0 %
Eosinophils Absolute: 0.2 10*3/uL (ref 0.0–0.5)
Eosinophils Relative: 3 %
HCT: 35.2 % — ABNORMAL LOW (ref 36.0–46.0)
Hemoglobin: 11.1 g/dL — ABNORMAL LOW (ref 12.0–15.0)
Immature Granulocytes: 0 %
Lymphocytes Relative: 26 %
Lymphs Abs: 1.8 10*3/uL (ref 0.7–4.0)
MCH: 27.2 pg (ref 26.0–34.0)
MCHC: 31.5 g/dL (ref 30.0–36.0)
MCV: 86.3 fL (ref 80.0–100.0)
Monocytes Absolute: 0.5 10*3/uL (ref 0.1–1.0)
Monocytes Relative: 7 %
Neutro Abs: 4.6 10*3/uL (ref 1.7–7.7)
Neutrophils Relative %: 64 %
Platelets: 233 10*3/uL (ref 150–400)
RBC: 4.08 MIL/uL (ref 3.87–5.11)
RDW: 14.4 % (ref 11.5–15.5)
WBC: 7.2 10*3/uL (ref 4.0–10.5)
nRBC: 0 % (ref 0.0–0.2)

## 2023-03-11 LAB — HEMOGLOBIN A1C
Hgb A1c MFr Bld: 6.4 % — ABNORMAL HIGH (ref 4.8–5.6)
Mean Plasma Glucose: 136.98 mg/dL

## 2023-03-11 LAB — GLUCOSE, CAPILLARY
Glucose-Capillary: 135 mg/dL — ABNORMAL HIGH (ref 70–99)
Glucose-Capillary: 114 mg/dL — ABNORMAL HIGH (ref 70–99)

## 2023-03-11 MED ORDER — CLONAZEPAM 0.5 MG PO TABS
1.0000 mg | ORAL_TABLET | Freq: Every day | ORAL | Status: DC
  Administered 2023-03-11: 1 mg via ORAL
  Filled 2023-03-11: qty 2

## 2023-03-11 MED ORDER — ENOXAPARIN SODIUM 120 MG/0.8ML IJ SOSY
1.0000 mg/kg | PREFILLED_SYRINGE | Freq: Two times a day (BID) | INTRAMUSCULAR | Status: DC
  Administered 2023-03-11 – 2023-03-12 (×2): 120 mg via SUBCUTANEOUS
  Filled 2023-03-11 (×2): qty 0.8

## 2023-03-11 MED ORDER — HYDROCODONE-ACETAMINOPHEN 5-325 MG PO TABS
1.0000 | ORAL_TABLET | Freq: Once | ORAL | Status: AC
  Administered 2023-03-11: 1 via ORAL
  Filled 2023-03-11: qty 1

## 2023-03-11 MED ORDER — NORTRIPTYLINE HCL 10 MG PO CAPS
20.0000 mg | ORAL_CAPSULE | Freq: Every day | ORAL | Status: DC
  Administered 2023-03-11: 20 mg via ORAL
  Filled 2023-03-11 (×2): qty 2

## 2023-03-11 MED ORDER — DICLOFENAC SODIUM 1 % EX GEL
2.0000 g | Freq: Every day | CUTANEOUS | Status: DC | PRN
  Filled 2023-03-11: qty 100

## 2023-03-11 MED ORDER — INSULIN DETEMIR 100 UNIT/ML ~~LOC~~ SOLN
60.0000 [IU] | Freq: Two times a day (BID) | SUBCUTANEOUS | Status: DC
  Administered 2023-03-11 – 2023-03-12 (×2): 60 [IU] via SUBCUTANEOUS
  Filled 2023-03-11 (×3): qty 0.6

## 2023-03-11 MED ORDER — SALINE SPRAY 0.65 % NA SOLN
2.0000 | Freq: Every day | NASAL | Status: DC
  Administered 2023-03-11: 2 via NASAL
  Filled 2023-03-11: qty 44

## 2023-03-11 MED ORDER — INSULIN ASPART 100 UNIT/ML IJ SOLN
0.0000 [IU] | Freq: Three times a day (TID) | INTRAMUSCULAR | Status: DC
  Administered 2023-03-11 – 2023-03-12 (×2): 3 [IU] via SUBCUTANEOUS

## 2023-03-11 MED ORDER — HYDROCODONE-ACETAMINOPHEN 7.5-325 MG PO TABS
1.0000 | ORAL_TABLET | Freq: Four times a day (QID) | ORAL | Status: DC | PRN
  Administered 2023-03-11 – 2023-03-12 (×2): 1 via ORAL
  Filled 2023-03-11 (×2): qty 1

## 2023-03-11 MED ORDER — ALBUTEROL SULFATE (2.5 MG/3ML) 0.083% IN NEBU: 3.0000 mL | INHALATION_SOLUTION | Freq: Every evening | RESPIRATORY_TRACT | Status: DC | PRN

## 2023-03-11 MED ORDER — INSULIN ASPART 100 UNIT/ML IJ SOLN
6.0000 [IU] | Freq: Three times a day (TID) | INTRAMUSCULAR | Status: DC
  Administered 2023-03-11: 6 [IU] via SUBCUTANEOUS

## 2023-03-11 MED ORDER — TECHNETIUM TO 99M ALBUMIN AGGREGATED
4.2000 | Freq: Once | INTRAVENOUS | Status: AC | PRN
  Administered 2023-03-11: 4.2 via INTRAVENOUS

## 2023-03-11 MED ORDER — HEPARIN BOLUS VIA INFUSION
5000.0000 [IU] | Freq: Once | INTRAVENOUS | Status: DC
  Filled 2023-03-11: qty 5000

## 2023-03-11 MED ORDER — HEPARIN (PORCINE) 25000 UT/250ML-% IV SOLN
1450.0000 [IU]/h | INTRAVENOUS | Status: DC
  Filled 2023-03-11: qty 250

## 2023-03-11 MED ORDER — LABETALOL HCL 200 MG PO TABS
300.0000 mg | ORAL_TABLET | Freq: Two times a day (BID) | ORAL | Status: DC
  Administered 2023-03-11 – 2023-03-12 (×2): 300 mg via ORAL
  Filled 2023-03-11 (×2): qty 2

## 2023-03-11 MED ORDER — METHOCARBAMOL 500 MG PO TABS: 500.0000 mg | ORAL_TABLET | Freq: Every day | ORAL | Status: DC | PRN

## 2023-03-11 MED ORDER — LOSARTAN POTASSIUM 50 MG PO TABS
25.0000 mg | ORAL_TABLET | Freq: Every day | ORAL | Status: DC
  Administered 2023-03-12: 25 mg via ORAL
  Filled 2023-03-11: qty 1

## 2023-03-11 MED ORDER — EZETIMIBE 10 MG PO TABS
10.0000 mg | ORAL_TABLET | Freq: Every morning | ORAL | Status: DC
Start: 2023-03-12 — End: 2023-03-12
  Administered 2023-03-12: 10 mg via ORAL
  Filled 2023-03-11: qty 1

## 2023-03-11 MED ORDER — ASPIRIN 81 MG PO TBEC
81.0000 mg | DELAYED_RELEASE_TABLET | Freq: Every day | ORAL | Status: DC
  Administered 2023-03-11: 81 mg via ORAL
  Filled 2023-03-11: qty 1

## 2023-03-11 MED ORDER — DILTIAZEM HCL ER COATED BEADS 120 MG PO CP24
120.0000 mg | ORAL_CAPSULE | Freq: Every morning | ORAL | Status: DC
  Administered 2023-03-12: 120 mg via ORAL
  Filled 2023-03-11: qty 1

## 2023-03-11 MED ORDER — PANTOPRAZOLE SODIUM 40 MG PO TBEC
40.0000 mg | DELAYED_RELEASE_TABLET | Freq: Every day | ORAL | Status: DC
  Administered 2023-03-12: 40 mg via ORAL
  Filled 2023-03-11: qty 1

## 2023-03-11 NOTE — H&P (Addendum)
HPI  Suzanne Patton YQM:[id number] DOB: 04-29-1950 DOA: 03/11/2023  PCP: Karenann Cai, NP   Chief Complaint: Subacute SOB  HPI:   72 year old female Jehovah's witness does not take blood Known HTN HLD DM TY 2 DM TY 2 with PCI 2012 --PSVT with ablations performed in [place]-on metoprolol Cardizem and managed by Dr. Rosemary Holms locally Previous admission for abdominal wound status post I&D-was from insulin shots Obstructive sleep apnea diagnosed in 2003 on CPAP   Seen by Dr. Vassie Loll dyspnea 12/18 was started on Lasix for 3 to 5 days because felt to be fluid weight-blood work was performed 12/22 showed BUN/creatinine 24/1.5 hemoglobin 11 In the interim because of her arthritis apparently sleepy and came to ED 12/22-was allegedly she was taking too much of her Norco 7.5 and accidentally took 7-8 came to the ED but woke up and was not severely symptomatic was sent home   she was discharged home in good condition but received a call from the lab--dimer done on 1218 was above 500 --- an outpatient VQ scan was done which was positive for multiple segmental defects in anterior posterior right midlung without chest radiograph correlate suspicious for PE although unclear chronicity   Multitude of complaints today tells me that she has occasional dizziness and lightheadedness-relates that she also has some difficulty swallowing She has been short of breath on and off for several months without clear etiology and tells me that when she presented like this sometime about a year ago to Dr. Rosemary Holms cardiac cath was performed showing patent proximal and mid RCA stent with 10% lumen loss and it was felt that patient's obesity and diastolic heart failure were the cause of her dyspnea  Tells me that a while back she was placed on Coumadin but was told not to take Eliquis for some unclear reason may be a genetic polymorphism Her daughter has had PEs in the past while she was pregnant and smoking  Patient  does not drink does not smoke No long travel history >4 hours not on HRT      Review of Systems:  Complains of DOE Complains of visual loss on the left eye after injections complains of some back pain and shoulder pain  ED Course: Given Norco, started on Cardizem 120 Zetia 10 and was supposed to start on heparin but I changed to Lovenox given her hemodynamic stability Chem-12 is pending   Past Medical History:  Diagnosis Date   Blood transfusion declined because patient is Jehovah's Witness    CAD (coronary artery disease)    Complication of anesthesia    Degenerative joint disease    Diabetes mellitus without complication (HCC)    Fibromyalgia    Hyperlipidemia    Hypertension    Neuromuscular disorder (HCC)    Osteoarthritis    PVD (peripheral vascular disease) (HCC)    s/p carotid stent   Sleep apnea    Stage 3b chronic kidney disease (CKD) (HCC)    Past Surgical History:  Procedure Laterality Date   ABLATION     CAROTID STENT     CHOLECYSTECTOMY     CORONARY PRESSURE/FFR STUDY N/A 03/20/2022   Procedure: INTRAVASCULAR PRESSURE WIRE/FFR STUDY;  Surgeon: Elder Negus, MD;  Location: [facility] INVASIVE CV LAB;  Service: Cardiovascular;  Laterality: N/A;   HYSTEROTOMY     IRRIGATION AND DEBRIDEMENT ABSCESS N/A 08/06/2021   Procedure: IRRIGATION AND DEBRIDEMENT ABDOMINAL WALL  ABSCESS;  Surgeon: Andria Meuse, MD;  Location: [facility] OR;  Service: General;  Laterality: N/A;   LEFT HEART CATH AND CORONARY ANGIOGRAPHY N/A 03/20/2022   Procedure: LEFT HEART CATH AND CORONARY ANGIOGRAPHY;  Surgeon: Elder Negus, MD;  Location: [facility] INVASIVE CV LAB;  Service: Cardiovascular;  Laterality: N/A;   ROTATOR CUFF REPAIR Right     reports that she has never smoked. She has been exposed to tobacco smoke. She has never used smokeless tobacco. She reports current alcohol use. She reports that she does not use drugs.  Mobility: Independent  Allergies  Allergen Reactions    Dulaglutide Shortness Of Breath    Other Reaction(s): respiratory distress   Semaglutide(0.25 Or 0.5mg -Dos) Nausea And Vomiting and Nausea Only   Meclizine Other (See Comments)    Psychotic episode   Other Other (See Comments)    A- blood type diet  Low fat, Healthy heart   Prednisone Other (See Comments)    hyperglycemia   Quinolones Other (See Comments)    severe muscle aches   Family History  Problem Relation Age of Onset   Atrial fibrillation Mother    Kidney failure Mother    Heart disease Father    Heart attack Father    Heart failure Father    COPD Father    Gallbladder disease Brother        gangrene   Prior to Admission medications   Medication Sig Start Date End Date Taking? Authorizing Provider  albuterol (VENTOLIN HFA) 108 (90 Base) MCG/ACT inhaler Inhale 2 puffs into the lungs at bedtime as needed for wheezing or shortness of breath. 08/16/20   [provider]  aspirin 81 MG EC tablet Take 81 mg by mouth at bedtime.    [provider]  Blood Glucose Monitoring Suppl (ONE TOUCH ULTRA 2) w/Device KIT daily. 03/29/22   [provider]  CALCIUM CITRATE PO Take 4 tablets by mouth at bedtime.    [provider]  citalopram (CELEXA) 40 MG tablet Take 40 mg by mouth at bedtime. 03/01/20   [provider]  clonazePAM (KLONOPIN) 1 MG tablet Take 1 mg by mouth at bedtime. 07/10/22   [provider]  diclofenac Sodium (VOLTAREN) 1 % GEL Apply 2 g topically daily as needed (pain).    [provider]  diltiazem (CARDIZEM CD) 120 MG 24 hr capsule TAKE 1 CAPSULE (120 MG TOTAL) BY MOUTH EVERY MORNING 02/07/23   Patwardhan, Manish J, MD  ezetimibe (ZETIA) 10 MG tablet Take 10 mg by mouth every morning. 06/23/20   [provider]  furosemide (LASIX) 40 MG tablet TAKE 1 TABLET BY MOUTH EVERY DAY 02/07/23   Patwardhan, Manish J, MD  GVOKE HYPOPEN 2-PACK 1 MG/0.2ML SOAJ Take 1 mg by mouth as needed (Low blood glucose).     [provider]  HYDROcodone-acetaminophen (NORCO) 7.5-325 MG tablet Take 1 tablet by mouth every 6 (six) hours as needed for severe pain.    [provider]  insulin detemir (LEVEMIR) 100 UNIT/ML injection Inject 60 Units into the skin 2 (two) times daily.    [provider]  insulin lispro (HUMALOG) 100 UNIT/ML injection Inject 10-20 Units into the skin See admin instructions. Inject 10-20 units subcutaneously up to 3 times daily - sliding scale is based on CBG 01/16/20   [provider]  labetalol (NORMODYNE) 300 MG tablet Take 300 mg by mouth in the morning and at bedtime.    [provider]  lamoTRIgine (LAMICTAL) 200 MG tablet Take 200 mg by mouth 2 (two) times daily. 06/27/20  [provider]  Lancets (ONETOUCH ULTRASOFT) lancets TEST 3 TIMES A DAY AS DIRECTED 06/25/22   [provider]  levocetirizine (XYZAL) 5 MG tablet Take 5 mg by mouth at bedtime.    [provider]  losartan (COZAAR) 25 MG tablet Take 1 tablet (25 mg total) by mouth daily. 02/07/23 08/06/23  Patwardhan, Anabel Bene, MD  methocarbamol (ROBAXIN) 500 MG tablet Take 500 mg by mouth daily as needed. 08/01/22   [provider]  nitroGLYCERIN (NITROSTAT) 0.4 MG SL tablet Place 1 tablet (0.4 mg total) under the tongue every 5 (five) minutes as needed for chest pain. 12/18/22   Patwardhan, Anabel Bene, MD  nortriptyline (PAMELOR) 10 MG capsule TAKE ONE OR TWO CAPSULES BY MOUTH AT BEDTIME 02/06/23   Sater, Pearletha Furl, MD  Omega-3 Fatty Acids (FISH OIL OMEGA-3) 1000 MG CAPS Take 1,000 mg by mouth at bedtime.    [provider]  omeprazole (PRILOSEC) 20 MG capsule Take 20 mg by mouth every morning. 06/23/20   [provider]  [facility] ULTRA test strip 3 (three) times daily. for testing 07/27/22   [provider]  rosuvastatin (CRESTOR) 20 MG tablet Take 1 tablet (20 mg total) by mouth daily. 08/10/22 11/08/22  Patwardhan, Anabel Bene, MD  sodium  chloride (OCEAN) 0.65 % nasal spray Place 2 sprays into the nose at bedtime. [place]    [provider]    Physical Exam:  Vitals:   03/11/23 1451 03/11/23 1500  BP: 129/63 (!) 147/70  Pulse: 66 64  Resp: 18 17  Temp: 98.4 F (36.9 C)   SpO2: 98% 93%    Awake coherent no distress looks fair thick neck Mallampati 4 no icterus no pallor Neck soft supple ROM intact moving 4 limbs equally S1-S2 no murmur Chest is clear without rales rhonchi-obese nontender nondistended no rebound no guarding Power 5/5 grossly Psych euthymic  I have personally reviewed following labs and imaging studies  Labs:  Pending at this time  Imaging studies:     Medical tests:  EKG independently reviewed:   Test discussed with performing physician: Yes discussed with the ED  Principal Problem:   Pulmonary emboli (HCC)   Assessment/Plan High suspicion pulmonary emboli Given hemodynamic stability, no chest pain and not tachycardic, will convert heparin to Lovenox and then place TOC order for DOAC as I feel if this is sorted out she probably will be able to go home I do not feel she necessarily requires an echo given her hemodynamic stability-we can obtain DVT lower extremities while she is here and get an outpatient echo  Diabetes mellitus type 2 Resume Levemir 60 twice daily, lispro insulin sliding scale started with resistant coverage  HTN, PSVT, prior CAD Resume Cozaar 25, Cardizem 120 resumed and labetalol 300 twice daily resumed-resume aspirin, Keep on telemetry overnight Not sure if she takes Lasix regularly and would hold for now  Chronic low back pain shoulder pain Resume Robaxin 500 daily as needed back spasm, Norco 1 tab 7.5/325 every 6 as needed severe pain  Depression Resume Celexa 40, Pamelor 20 at bedtime as well as Klonopin 1 mg at bedtime   Severity of Illness: The appropriate patient status for this patient is OBSERVATION. Observation status is judged to be  reasonable and necessary in order to provide the required intensity of service to ensure the patient's safety. The patient's presenting symptoms, physical exam findings, and initial radiographic and laboratory data in the context of their medical condition is felt to  place them at decreased risk for further clinical deterioration. Furthermore, it is anticipated that the patient will be medically stable for discharge from the hospital within 2 midnights of admission.    DVT prophylaxis: Full dose Lovenox Code Status: Full Family Communication: Daughter Consults called: None  Time spent: 45 minutes  Mahala Menghini, MD Cordelia Poche my NP partners at night for Care related issues] [facility] --[employer] OR , [email or web address]; password [facility]  03/11/2023, 4:02 PM

## 2023-03-11 NOTE — Progress Notes (Addendum)
ANTICOAGULATION CONSULT NOTE - Initial Consult  Pharmacy Consult for Heparin Indication: pulmonary embolus  Allergies  Allergen Reactions   Dulaglutide Shortness Of Breath    Other Reaction(s): respiratory distress   Semaglutide(0.25 Or 0.5mg -Dos) Nausea And Vomiting and Nausea Only   Meclizine Other (See Comments)    Psychotic episode   Other Other (See Comments)    A- blood type diet  Low fat, Healthy heart   Prednisone Other (See Comments)    hyperglycemia   Quinolones Other (See Comments)    severe muscle aches    Patient Measurements: Height: 5\' 2"  (157.5 cm) Weight: 121.3 kg (267 lb 6.7 oz) IBW/kg (Calculated) : 50.1 Heparin Dosing Weight: 80.2 kg  Vital Signs: Temp: 98.4 F (36.9 C) (12/23 1451) Temp Source: Oral (12/23 1451) BP: 147/70 (12/23 1500) Pulse Rate: 64 (12/23 1500)  Labs: Recent Labs    03/10/23 1039  HGB 11.0*  HCT 36.0  PLT 241  CREATININE 1.57*    Estimated Creatinine Clearance: 39.6 mL/min (A) (by C-G formula based on SCr of 1.57 mg/dL (H)).   Medical History: Past Medical History:  Diagnosis Date   Blood transfusion declined because patient is Jehovah's Witness    CAD (coronary artery disease)    Complication of anesthesia    Degenerative joint disease    Diabetes mellitus without complication (HCC)    Fibromyalgia    Hyperlipidemia    Hypertension    Neuromuscular disorder (HCC)    Osteoarthritis    PVD (peripheral vascular disease) (HCC)    s/p carotid stent   Sleep apnea    Stage 3b chronic kidney disease (CKD) (HCC)     Medications:  (Not in a hospital admission)  Scheduled:  Infusions:  PRN:   Assessment: 65 yof with a history of T2DM, CAD, HLD, HTN, arthritis, sleep apnea, CKD. Patient is presenting with positive outpatient perfusion scan consistent w/ PE. Heparin per pharmacy consult placed for pulmonary embolus.  Patient is not on anticoagulation prior to arrival.  Hgb 11; plt 241  Goal of Therapy:   Heparin level 0.3-0.7 units/ml Monitor platelets by anticoagulation protocol: Yes   Plan:  Give IV heparin 5000 units bolus x 1 Start heparin infusion at 1450 units/hr Check anti-Xa level in 8 hours and daily while on heparin Continue to monitor H&H and platelets  Delmar Landau, PharmD, BCPS 03/11/2023 3:29 PM ED Clinical Pharmacist -  [phone]  ADDENDUM 03/11/23 4:11 PM: MD Samtani change from heparin to enoxaparin per pharmacy consult No heparin has been administered 1mg /kg enoxaparin q12h Continue to monitor H&H, platelets, and renal function F/u plan for [facility] post-[facility]  Delmar Landau, PharmD, BCPS 03/11/2023 4:12 PM ED Clinical Pharmacist -  [phone]

## 2023-03-11 NOTE — ED Triage Notes (Signed)
Pt coming in from home after she had a chest x ray this am and was told to come to the ed because she had a PE.

## 2023-03-11 NOTE — Telephone Encounter (Signed)
Perfusion scan shows multiple mismatch defects.  High clinical probability of PE. I was unable to reach the patient but was able to reach her daughter Shanda Bumps who then got the patient over the phone-conveyed the results.  Advised to go to the emergency room for evaluation. She will need IV blood thinner for 24 to 48 hours and transition to oral anticoagulation

## 2023-03-11 NOTE — Plan of Care (Signed)

## 2023-03-11 NOTE — ED Provider Notes (Signed)
[place] EMERGENCY DEPARTMENT AT [facility] Provider Note   CSN: [id number] Arrival date & time: 03/11/23  1435     History  Chief Complaint  Patient presents with   Shortness of Breath    Suzanne Patton is a 72 y.o. female.   Shortness of Breath Patient presents for shortness of breath.  Medical history includes DM, CAD, HLD, HTN, arthritis, sleep apnea, CKD.  She was seen in the ED yesterday for accidental opiate overdose.  She was observed for 5 hours and discharged home.  She has had ongoing shortness of breath and exercise intolerance which waxes and wanes in severity.  She is followed by pulmonology.  She recently had an outpatient D-dimer test which was elevated to greater than 5.  Because of this, she underwent an outpatient perfusion scan today which shows high probability of PE.  She was advised to come to the ED. patient reports intermittent thoracic left back pain.  She has other areas of pain which she attributes to arthritis.  She will occasionally have swelling in her left leg.  She is not on oxygen at baseline.     Home Medications Prior to Admission medications   Medication Sig Start Date End Date Taking? Authorizing Provider  albuterol (VENTOLIN HFA) 108 (90 Base) MCG/ACT inhaler Inhale 2 puffs into the lungs at bedtime as needed for wheezing or shortness of breath. 08/16/20   [provider]  aspirin 81 MG EC tablet Take 81 mg by mouth at bedtime.    [provider]  Blood Glucose Monitoring Suppl (ONE TOUCH ULTRA 2) w/Device KIT daily. 03/29/22   [provider]  CALCIUM CITRATE PO Take 4 tablets by mouth at bedtime.    [provider]  citalopram (CELEXA) 40 MG tablet Take 40 mg by mouth at bedtime. 03/01/20   [provider]  clonazePAM (KLONOPIN) 1 MG tablet Take 1 mg by mouth at bedtime. 07/10/22   [provider]  diclofenac Sodium (VOLTAREN) 1 % GEL Apply 2 g topically daily as needed (pain).     [provider]  diltiazem (CARDIZEM CD) 120 MG 24 hr capsule TAKE 1 CAPSULE (120 MG TOTAL) BY MOUTH EVERY MORNING 02/07/23   Patwardhan, Manish J, MD  ezetimibe (ZETIA) 10 MG tablet Take 10 mg by mouth every morning. 06/23/20   [provider]  furosemide (LASIX) 40 MG tablet TAKE 1 TABLET BY MOUTH EVERY DAY 02/07/23   Patwardhan, Manish J, MD  GVOKE HYPOPEN 2-PACK 1 MG/0.2ML SOAJ Take 1 mg by mouth as needed (Low blood glucose).    [provider]  HYDROcodone-acetaminophen (NORCO) 7.5-325 MG tablet Take 1 tablet by mouth every 6 (six) hours as needed for severe pain.    [provider]  insulin detemir (LEVEMIR) 100 UNIT/ML injection Inject 60 Units into the skin 2 (two) times daily.    [provider]  insulin lispro (HUMALOG) 100 UNIT/ML injection Inject 10-20 Units into the skin See admin instructions. Inject 10-20 units subcutaneously up to 3 times daily - sliding scale is based on CBG 01/16/20   [provider]  labetalol (NORMODYNE) 300 MG tablet Take 300 mg by mouth in the morning and at bedtime.    [provider]  lamoTRIgine (LAMICTAL) 200 MG tablet Take 200 mg by mouth 2 (two) times daily. 06/27/20   [provider]  Lancets (ONETOUCH ULTRASOFT) lancets TEST 3 TIMES A DAY AS DIRECTED 06/25/22   [provider]  levocetirizine Elita Boone)  5 MG tablet Take 5 mg by mouth at bedtime.    [provider]  losartan (COZAAR) 25 MG tablet Take 1 tablet (25 mg total) by mouth daily. 02/07/23 08/06/23  Patwardhan, Anabel Bene, MD  methocarbamol (ROBAXIN) 500 MG tablet Take 500 mg by mouth daily as needed. 08/01/22   [provider]  nitroGLYCERIN (NITROSTAT) 0.4 MG SL tablet Place 1 tablet (0.4 mg total) under the tongue every 5 (five) minutes as needed for chest pain. 12/18/22   Patwardhan, Anabel Bene, MD  nortriptyline (PAMELOR) 10 MG capsule TAKE ONE OR TWO CAPSULES BY MOUTH AT BEDTIME 02/06/23   Sater, Pearletha Furl,  MD  Omega-3 Fatty Acids (FISH OIL OMEGA-3) 1000 MG CAPS Take 1,000 mg by mouth at bedtime.    [provider]  omeprazole (PRILOSEC) 20 MG capsule Take 20 mg by mouth every morning. 06/23/20   [provider]  [facility] ULTRA test strip 3 (three) times daily. for testing 07/27/22   [provider]  rosuvastatin (CRESTOR) 20 MG tablet Take 1 tablet (20 mg total) by mouth daily. 08/10/22 11/08/22  Patwardhan, Anabel Bene, MD  sodium chloride (OCEAN) 0.65 % nasal spray Place 2 sprays into the nose at bedtime. [place]    [provider]      Allergies    Dulaglutide, Semaglutide(0.25 or 0.5mg -dos), Meclizine, Other, Prednisone, and Quinolones    Review of Systems   Review of Systems  Respiratory:  Positive for shortness of breath.   Musculoskeletal:  Positive for back pain.  All other systems reviewed and are negative.   Physical Exam Updated Vital Signs BP (!) 147/70   Pulse 64   Temp 98.4 F (36.9 C) (Oral)   Resp 17   Ht 5\' 2"  (1.575 m)   Wt 121.3 kg   SpO2 93%   BMI 48.91 kg/m  Physical Exam Vitals and nursing note reviewed.  Constitutional:      General: She is not in acute distress.    Appearance: She is well-developed. She is not ill-appearing, toxic-appearing or diaphoretic.  HENT:     Head: Normocephalic and atraumatic.     Mouth/Throat:     Mouth: Mucous membranes are moist.  Eyes:     Conjunctiva/sclera: Conjunctivae normal.     Pupils: Pupils are equal, round, and reactive to light.  Cardiovascular:     Rate and Rhythm: Normal rate and regular rhythm.  Pulmonary:     Effort: Pulmonary effort is normal. No respiratory distress.     Breath sounds: Normal breath sounds.  Abdominal:     Palpations: Abdomen is soft.     Tenderness: There is no abdominal tenderness.  Musculoskeletal:        General: No swelling. Normal range of motion.     Cervical back: Neck supple.     Right lower leg: No edema.     Left lower leg: No edema.  Skin:     General: Skin is warm and dry.     Capillary Refill: Capillary refill takes less than 2 seconds.     Coloration: Skin is not cyanotic or pale.  Neurological:     General: No focal deficit present.     Mental Status: She is alert and oriented to person, place, and time.  Psychiatric:        Mood and Affect: Mood normal.        Behavior: Behavior normal.     ED Results / Procedures / Treatments   Labs (all labs  ordered are listed, but only abnormal results are displayed) Labs Reviewed  CBC WITH DIFFERENTIAL/PLATELET  COMPREHENSIVE METABOLIC PANEL    EKG None  Radiology DG Chest 2 View Result Date: 03/11/2023 CLINICAL DATA:  dyspnea , prior to NM scan EXAM: CHEST - 2 VIEW COMPARISON:  11/02/2022 chest radiograph. FINDINGS: Stable cardiomediastinal silhouette with top-normal heart size. No pneumothorax. No pleural effusion. Lungs appear clear, with no acute consolidative airspace disease and no pulmonary edema. Surgical clips noted in the upper abdomen on the lateral view. IMPRESSION: No active cardiopulmonary disease. Electronically Signed   By: Delbert Phenix M.D.   On: 03/11/2023 13:08   NM Pulmonary Perfusion Result Date: 03/11/2023 CLINICAL DATA:  Inpatient.  Dyspnea. EXAM: NUCLEAR MEDICINE PERFUSION LUNG SCAN TECHNIQUE: Perfusion images were obtained in multiple projections after intravenous injection of radiopharmaceutical. Ventilation scans intentionally deferred if perfusion scan and chest x-ray adequate for interpretation during COVID 19 epidemic. RADIOPHARMACEUTICALS:  4.2 mCi Tc-85m MAA IV COMPARISON:  Chest radiograph from earlier today. FINDINGS: Perfusion images demonstrate multiple segmental defects in the anterior and posterior right mid lung, without chest radiograph correlate. No definite left lung perfusion defects. IMPRESSION: Multiple segmental defects in the anterior and posterior right mid lung, without chest radiograph correlate, suspicious for pulmonary embolism.  These results will be called to the ordering clinician or representative by the Radiologist Assistant, and communication documented in the PACS or [employer]. Electronically Signed   By: Delbert Phenix M.D.   On: 03/11/2023 13:07    Procedures Procedures    Medications Ordered in ED Medications  diltiazem (CARDIZEM CD) 24 hr capsule 120 mg (has no administration in time range)  ezetimibe (ZETIA) tablet 10 mg (has no administration in time range)  enoxaparin (LOVENOX) injection 120 mg (has no administration in time range)  HYDROcodone-acetaminophen (NORCO/VICODIN) 5-325 MG per tablet 1 tablet (has no administration in time range)    ED Course/ Medical Decision Making/ A&P                                 Medical Decision Making Amount and/or Complexity of Data Reviewed Labs: ordered.  Risk Prescription drug management. Decision regarding hospitalization.   This patient presents to the ED for concern of shortness of breath, this involves an extensive number of treatment options, and is a complaint that carries with it a high risk of complications and morbidity.  The differential diagnosis includes PE, pneumonia, CHF exacerbation, reactive airway disease, URI, anemia, acidosis   Co morbidities that complicate the patient evaluation  DM, CAD, HLD, HTN, arthritis, sleep apnea, CKD   Additional history obtained:  Additional history obtained from N/A External records from outside source obtained and reviewed including EMR   Lab Tests:  I reviewed lab work from yesterday which showed baseline creatinine, normal electrolytes, baseline anemia, no leukocytosis   Imaging Studies ordered:  I reviewed outpatient NM pulmonary perfusion study from this morning which showed findings consistent with PE. I agree with the radiologist interpretation   Cardiac Monitoring: / EKG:  The patient was maintained on a cardiac monitor.  I personally viewed and interpreted the cardiac  monitored which showed an underlying rhythm of: Sinus rhythm  Problem List / ED Course / Critical interventions / Medication management  Patient presenting for positive VQ scan earlier today.  On arrival in the ED, vital signs are normal.  SpO2 is normal on room air.  Current breathing is unlabored.  She is able to speak in complete sentences.  Lungs are clear to auscultation.  She denies any history of VTE in the past.  She has been on Eliquis before but is not sure why.  Heparin was initiated.  Patient to be admitted for further management. I ordered medication including heparin for PE Reevaluation of the patient after these medicines showed that the patient stayed the same I have reviewed the patients home medicines and have made adjustments as needed   Social Determinants of Health:  Has access to outpatient care  CRITICAL CARE Performed by: Gloris Manchester   Total critical care time: 31 minutes  Critical care time was exclusive of separately billable procedures and treating other patients.  Critical care was necessary to treat or prevent imminent or life-threatening deterioration.  Critical care was time spent personally by me on the following activities: development of treatment plan with patient and/or surrogate as well as nursing, discussions with consultants, evaluation of patient's response to treatment, examination of patient, obtaining history from patient or surrogate, ordering and performing treatments and interventions, ordering and review of laboratory studies, ordering and review of radiographic studies, pulse oximetry and re-evaluation of patient's condition.         Final Clinical Impression(s) / ED Diagnoses Final diagnoses:  Pulmonary embolism, unspecified chronicity, unspecified pulmonary embolism type, unspecified whether acute cor pulmonale present [facility])    Rx / DC Orders ED Discharge Orders     None         Gloris Manchester, MD 03/11/23 [phone]

## 2023-03-11 NOTE — ED Notes (Signed)
ED TO INPATIENT HANDOFF REPORT  ED Nurse Name and Phone #: Paulino Rily [phone]  S Name/Age/Gender Suzanne Patton 72 y.o. female Room/Bed: 002C/002C  Code Status   Code Status: Prior  Home/SNF/Other Home Patient oriented to: self, place, time, and situation Is this baseline? Yes   Triage Complete: Triage complete  Chief Complaint Pulmonary emboli [facility]) [I26.99]  Triage Note Pt coming in from home after she had a chest x ray this am and was told to come to the ed because she had a PE.    Allergies Allergies  Allergen Reactions   Dulaglutide Shortness Of Breath    Other Reaction(s): respiratory distress   Semaglutide(0.25 Or 0.5mg -Dos) Nausea And Vomiting and Nausea Only   Meclizine Other (See Comments)    Psychotic episode   Other Other (See Comments)    A- blood type diet  Low fat, Healthy heart   Prednisone Other (See Comments)    hyperglycemia   Quinolones Other (See Comments)    severe muscle aches    Level of Care/Admitting Diagnosis ED Disposition     ED Disposition  Admit   Condition  --   Comment  Hospital Area: [facility] [[id number]]  Level of Care: Telemetry Medical [104]  May admit patient to [facility] or [facility] if equivalent level of care is available:: No  Covid Evaluation: Confirmed COVID Negative  Diagnosis: Pulmonary emboli [facility]) [[id number]]  Admitting Physician: Rhetta Mura [phone]  Attending Physician: Rhetta Mura [phone]  Certification:: I certify this patient will need inpatient services for at least 2 midnights  Expected Medical Readiness: 03/13/2023          B Medical/Surgery History Past Medical History:  Diagnosis Date   Blood transfusion declined because patient is Jehovah's Witness    CAD (coronary artery disease)    Complication of anesthesia    Degenerative joint disease    Diabetes mellitus without complication (HCC)    Fibromyalgia    Hyperlipidemia    Hypertension    Neuromuscular  disorder (HCC)    Osteoarthritis    PVD (peripheral vascular disease) (HCC)    s/p carotid stent   Sleep apnea    Stage 3b chronic kidney disease (CKD) (HCC)    Past Surgical History:  Procedure Laterality Date   ABLATION     CAROTID STENT     CHOLECYSTECTOMY     CORONARY PRESSURE/FFR STUDY N/A 03/20/2022   Procedure: INTRAVASCULAR PRESSURE WIRE/FFR STUDY;  Surgeon: Elder Negus, MD;  Location: [facility] INVASIVE CV LAB;  Service: Cardiovascular;  Laterality: N/A;   HYSTEROTOMY     IRRIGATION AND DEBRIDEMENT ABSCESS N/A 08/06/2021   Procedure: IRRIGATION AND DEBRIDEMENT ABDOMINAL WALL  ABSCESS;  Surgeon: Andria Meuse, MD;  Location: [facility] OR;  Service: General;  Laterality: N/A;   LEFT HEART CATH AND CORONARY ANGIOGRAPHY N/A 03/20/2022   Procedure: LEFT HEART CATH AND CORONARY ANGIOGRAPHY;  Surgeon: Elder Negus, MD;  Location: [facility] INVASIVE CV LAB;  Service: Cardiovascular;  Laterality: N/A;   ROTATOR CUFF REPAIR Right      A IV Location/Drains/Wounds Patient Lines/Drains/Airways Status     Active Line/Drains/Airways     Name Placement date Placement time Site Days   Peripheral IV 03/11/23 20 G Anterior;Left Forearm 03/11/23  1515  Forearm  less than 1            Intake/Output Last 24 hours No intake or output data in the 24 hours ending 03/11/23 1548  Labs/Imaging Results for orders  placed or performed during the hospital encounter of 03/10/23 (from the past 48 hours)  Comprehensive metabolic panel     Status: Abnormal   Collection Time: 03/10/23 10:39 AM  Result Value Ref Range   Sodium 141 135 - 145 mmol/L   Potassium 4.2 3.5 - 5.1 mmol/L   Chloride 102 98 - 111 mmol/L   CO2 31 22 - 32 mmol/L   Glucose, Bld 50 (L) 70 - 99 mg/dL    Comment: Glucose reference range applies only to samples taken after fasting for at least 8 hours.   BUN 24 (H) 8 - 23 mg/dL   Creatinine, Ser 1.61 (H) 0.44 - 1.00 mg/dL   Calcium 9.2 8.9 - 09.6 mg/dL   Total Protein 6.3 (L)  6.5 - 8.1 g/dL   Albumin 3.5 3.5 - 5.0 g/dL   AST 16 15 - 41 U/L   ALT 13 0 - 44 U/L   Alkaline Phosphatase 78 38 - 126 U/L   Total Bilirubin 0.6 <1.2 mg/dL   GFR, Estimated 35 (L) >60 mL/min    Comment: (NOTE) Calculated using the CKD-EPI Creatinine Equation (2021)    Anion gap 8 5 - 15    Comment: Performed at [facility], [address], [place], [place] [address]  CBC with Differential/Platelet     Status: Abnormal   Collection Time: 03/10/23 10:39 AM  Result Value Ref Range   WBC 5.9 4.0 - 10.5 K/uL   RBC 4.06 3.87 - 5.11 MIL/uL   Hemoglobin 11.0 (L) 12.0 - 15.0 g/dL   HCT 98.1 19.1 - 47.8 %   MCV 88.7 80.0 - 100.0 fL   MCH 27.1 26.0 - 34.0 pg   MCHC 30.6 30.0 - 36.0 g/dL   RDW 29.5 62.1 - 30.8 %   Platelets 241 150 - 400 K/uL   nRBC 0.0 0.0 - 0.2 %   Neutrophils Relative % 61 %   Neutro Abs 3.6 1.7 - 7.7 K/uL   Lymphocytes Relative 28 %   Lymphs Abs 1.7 0.7 - 4.0 K/uL   Monocytes Relative 8 %   Monocytes Absolute 0.5 0.1 - 1.0 K/uL   Eosinophils Relative 2 %   Eosinophils Absolute 0.1 0.0 - 0.5 K/uL   Basophils Relative 1 %   Basophils Absolute 0.0 0.0 - 0.1 K/uL   Immature Granulocytes 0 %   Abs Immature Granulocytes 0.01 0.00 - 0.07 K/uL    Comment: Performed at [facility], [address] [place], [place] [address]  POC CBG, ED     Status: Abnormal   Collection Time: 03/10/23 12:30 PM  Result Value Ref Range   Glucose-Capillary 116 (H) 70 - 99 mg/dL    Comment: Glucose reference range applies only to samples taken after fasting for at least 8 hours.  Acetaminophen level     Status: Abnormal   Collection Time: 03/10/23  2:44 PM  Result Value Ref Range   Acetaminophen (Tylenol), Serum <10 (L) 10 - 30 ug/mL    Comment: (NOTE) Therapeutic concentrations vary significantly. A range of 10-30 ug/mL  may be an effective concentration for many patients. However, some  are best treated at concentrations outside of this range. Acetaminophen  concentrations >150 ug/mL at 4 hours after ingestion  and >50 ug/mL at 12 hours after ingestion are often associated with  toxic reactions.  Performed at [facility], [address], [place], [place] [address]   Salicylate level     Status:  Abnormal   Collection Time: 03/10/23  2:44 PM  Result Value Ref Range   Salicylate Lvl <7.0 (L) 7.0 - 30.0 mg/dL    Comment: Performed at [facility], [address], [place], [place] [address]  CBG monitoring, ED     Status: Abnormal   Collection Time: 03/10/23  3:49 PM  Result Value Ref Range   Glucose-Capillary 187 (H) 70 - 99 mg/dL    Comment: Glucose reference range applies only to samples taken after fasting for at least 8 hours.   DG Chest 2 View Result Date: 03/11/2023 CLINICAL DATA:  dyspnea , prior to NM scan EXAM: CHEST - 2 VIEW COMPARISON:  11/02/2022 chest radiograph. FINDINGS: Stable cardiomediastinal silhouette with top-normal heart size. No pneumothorax. No pleural effusion. Lungs appear clear, with no acute consolidative airspace disease and no pulmonary edema. Surgical clips noted in the upper abdomen on the lateral view. IMPRESSION: No active cardiopulmonary disease. Electronically Signed   By: Delbert Phenix M.D.   On: 03/11/2023 13:08   NM Pulmonary Perfusion Result Date: 03/11/2023 CLINICAL DATA:  Inpatient.  Dyspnea. EXAM: NUCLEAR MEDICINE PERFUSION LUNG SCAN TECHNIQUE: Perfusion images were obtained in multiple projections after intravenous injection of radiopharmaceutical. Ventilation scans intentionally deferred if perfusion scan and chest x-ray adequate for interpretation during COVID 19 epidemic. RADIOPHARMACEUTICALS:  4.2 mCi Tc-22m MAA IV COMPARISON:  Chest radiograph from earlier today. FINDINGS: Perfusion images demonstrate multiple segmental defects in the anterior and posterior right mid lung, without chest radiograph correlate. No definite left lung perfusion defects. IMPRESSION: Multiple segmental defects in  the anterior and posterior right mid lung, without chest radiograph correlate, suspicious for pulmonary embolism. These results will be called to the ordering clinician or representative by the Radiologist Assistant, and communication documented in the PACS or [employer]. Electronically Signed   By: Delbert Phenix M.D.   On: 03/11/2023 13:07    Pending Labs Unresulted Labs (From admission, onward)     Start     Ordered   03/12/23 0500  Heparin level (unfractionated)  Daily,   R      03/11/23 1532   03/12/23 0000  Heparin level (unfractionated)  Once-Timed,   URGENT        03/11/23 1532            Vitals/Pain Today's Vitals   03/11/23 1446 03/11/23 1451 03/11/23 1500 03/11/23 1516  BP:  129/63 (!) 147/70   Pulse:  66 64   Resp:  18 17   Temp:  98.4 F (36.9 C)    TempSrc:  Oral    SpO2:  98% 93%   Weight: 121.3 kg     Height: 5\' 2"  (1.575 m)     PainSc:    0-No pain    Isolation Precautions No active isolations  Medications Medications  heparin ADULT infusion 100 units/mL (25000 units/269mL) (has no administration in time range)  heparin bolus via infusion 5,000 Units (has no administration in time range)  diltiazem (CARDIZEM CD) 24 hr capsule 120 mg (has no administration in time range)  ezetimibe (ZETIA) tablet 10 mg (has no administration in time range)    Mobility walks     Focused Assessments Pulmonary Assessment Handoff:  Lung sounds:   O2 Device: Room Air      R Recommendations: See Admitting Provider Note  Report given to:   Additional Notes:

## 2023-03-12 ENCOUNTER — Observation Stay (HOSPITAL_BASED_OUTPATIENT_CLINIC_OR_DEPARTMENT_OTHER): Payer: 59

## 2023-03-12 ENCOUNTER — Ambulatory Visit (HOSPITAL_COMMUNITY): Payer: 59 | Attending: Nurse Practitioner

## 2023-03-12 ENCOUNTER — Other Ambulatory Visit (HOSPITAL_COMMUNITY): Payer: Self-pay

## 2023-03-12 ENCOUNTER — Telehealth (HOSPITAL_COMMUNITY): Payer: Self-pay | Admitting: Pharmacy Technician

## 2023-03-12 DIAGNOSIS — M7989 Other specified soft tissue disorders: Secondary | ICD-10-CM

## 2023-03-12 DIAGNOSIS — I2694 Multiple subsegmental pulmonary emboli without acute cor pulmonale: Secondary | ICD-10-CM | POA: Diagnosis not present

## 2023-03-12 LAB — COMPREHENSIVE METABOLIC PANEL
ALT: 11 U/L (ref 0–44)
AST: 14 U/L — ABNORMAL LOW (ref 15–41)
Albumin: 3.3 g/dL — ABNORMAL LOW (ref 3.5–5.0)
Alkaline Phosphatase: 67 U/L (ref 38–126)
Anion gap: 6 (ref 5–15)
BUN: 23 mg/dL (ref 8–23)
CO2: 29 mmol/L (ref 22–32)
Calcium: 8.6 mg/dL — ABNORMAL LOW (ref 8.9–10.3)
Chloride: 104 mmol/L (ref 98–111)
Creatinine, Ser: 1.73 mg/dL — ABNORMAL HIGH (ref 0.44–1.00)
GFR, Estimated: 31 mL/min — ABNORMAL LOW (ref >60)
Glucose, Bld: 114 mg/dL — ABNORMAL HIGH (ref 70–99)
Potassium: 4.3 mmol/L (ref 3.5–5.1)
Sodium: 139 mmol/L (ref 135–145)
Total Bilirubin: 0.5 mg/dL (ref <1.2)
Total Protein: 5.7 g/dL — ABNORMAL LOW (ref 6.5–8.1)

## 2023-03-12 LAB — CBC
HCT: 32.7 % — ABNORMAL LOW (ref 36.0–46.0)
Hemoglobin: 10.1 g/dL — ABNORMAL LOW (ref 12.0–15.0)
MCH: 27.1 pg (ref 26.0–34.0)
MCHC: 30.9 g/dL (ref 30.0–36.0)
MCV: 87.7 fL (ref 80.0–100.0)
Platelets: 222 10*3/uL (ref 150–400)
RBC: 3.73 MIL/uL — ABNORMAL LOW (ref 3.87–5.11)
RDW: 14.6 % (ref 11.5–15.5)
WBC: 5.6 10*3/uL (ref 4.0–10.5)
nRBC: 0 % (ref 0.0–0.2)

## 2023-03-12 LAB — GLUCOSE, CAPILLARY
Glucose-Capillary: 83 mg/dL (ref 70–99)
Glucose-Capillary: 132 mg/dL — ABNORMAL HIGH (ref 70–99)

## 2023-03-12 MED ORDER — LAMOTRIGINE 100 MG PO TABS
200.0000 mg | ORAL_TABLET | Freq: Two times a day (BID) | ORAL | Status: DC
  Administered 2023-03-12: 200 mg via ORAL
  Filled 2023-03-12: qty 2

## 2023-03-12 MED ORDER — FUROSEMIDE 40 MG PO TABS
40.0000 mg | ORAL_TABLET | Freq: Every day | ORAL | Status: DC
  Administered 2023-03-12: 40 mg via ORAL
  Filled 2023-03-12: qty 1

## 2023-03-12 MED ORDER — RIVAROXABAN 10 MG PO TABS: 20.0000 mg | ORAL_TABLET | Freq: Every day | ORAL | Status: DC

## 2023-03-12 MED ORDER — RIVAROXABAN 15 MG PO TABS
15.0000 mg | ORAL_TABLET | Freq: Two times a day (BID) | ORAL | Status: DC
  Filled 2023-03-12: qty 1

## 2023-03-12 MED ORDER — CITALOPRAM HYDROBROMIDE 20 MG PO TABS
40.0000 mg | ORAL_TABLET | Freq: Every day | ORAL | Status: DC
  Administered 2023-03-12: 40 mg via ORAL
  Filled 2023-03-12: qty 2

## 2023-03-12 MED ORDER — RIVAROXABAN (XARELTO) VTE STARTER PACK (15 & 20 MG)
ORAL_TABLET | ORAL | 1 refills | Status: DC
  Filled 2023-03-12: qty 51, 30d supply, fill #0

## 2023-03-12 NOTE — Discharge Instructions (Addendum)
Information on my medicine - XARELTO (rivaroxaban)  This medication education was reviewed with me or my healthcare representative as part of my discharge preparation.  WHY WAS XARELTO PRESCRIBED FOR YOU? Xarelto was prescribed to treat blood clots that may have been found in the veins of your legs (deep vein thrombosis) or in your lungs (pulmonary embolism) and to reduce the risk of them occurring again.  What do you need to know about Xarelto? The starting dose is one 15 mg tablet taken TWICE daily with food for the FIRST 21 DAYS then on April 02, 2023  the dose is changed to one 20 mg tablet taken ONCE A DAY with your evening meal.  DO NOT stop taking Xarelto without talking to the health care provider who prescribed the medication.  Refill your prescription for 20 mg tablets before you run out.  After discharge, you should have regular check-up appointments with your healthcare provider that is prescribing your Xarelto.  In the future your dose may need to be changed if your kidney function changes by a significant amount.  What do you do if you miss a dose? If you are taking Xarelto TWICE DAILY and you miss a dose, take it as soon as you remember. You may take two 15 mg tablets (total 30 mg) at the same time then resume your regularly scheduled 15 mg twice daily the next day.  If you are taking Xarelto ONCE DAILY and you miss a dose, take it as soon as you remember on the same day then continue your regularly scheduled once daily regimen the next day. Do not take two doses of Xarelto at the same time.   Important Safety Information Xarelto is a blood thinner medicine that can cause bleeding. You should call your healthcare provider right away if you experience any of the following: Bleeding from an injury or your nose that does not stop. Unusual colored urine (red or dark brown) or unusual colored stools (red or black). Unusual bruising for unknown reasons. A serious fall or  if you hit your head (even if there is no bleeding).  Some medicines may interact with Xarelto and might increase your risk of bleeding while on Xarelto. To help avoid this, consult your healthcare provider or pharmacist prior to using any new prescription or non-prescription medications, including herbals, vitamins, non-steroidal anti-inflammatory drugs (NSAIDs) and supplements.  This website has more information on Xarelto: VisitDestination.com.br.

## 2023-03-12 NOTE — Progress Notes (Signed)
Bilateral lower extremity venous duplex has been completed.  Results can be found in chart review under CV Proc.  03/12/2023 3:31 PM  Fernande Bras, RVT.

## 2023-03-12 NOTE — Care Management Obs Status (Signed)
MEDICARE OBSERVATION STATUS NOTIFICATION   Patient Details  Name: Suzanne Patton MRN: [id number] Date of Birth: November 10, 1950   Medicare Observation Status Notification Given:  Yes    Darrold Span, RN 03/12/2023, 10:40 AM

## 2023-03-12 NOTE — Progress Notes (Signed)
CPAP unit set up w/nasal mask. Pt did not tol mask. Encouraged pt to try and bring in own nasal pillows. Unit not removed from room, remains @bedside  if needed.   03/11/23 2300  BiPAP/CPAP/SIPAP  $ Non-Invasive Home Ventilator  Initial  $ Face Mask Small Yes  BiPAP/CPAP/SIPAP Pt Type Adult  BiPAP/CPAP/SIPAP Resmed  Reason BIPAP/CPAP not in use Other(comment) ("mask too uncomfortable")  EPAP 9 cmH2O

## 2023-03-12 NOTE — Care Management CC44 (Signed)
Condition Code 44 Documentation Completed  Patient Details  Name: Tristan Kammerman MRN: [id number] Date of Birth: September 29, 1950   Condition Code 44 given:  Yes Patient signature on Condition Code 44 notice:  Yes Documentation of 2 MD's agreement:  Yes Code 44 added to claim:  Yes    Darrold Span, RN 03/12/2023, 10:40 AM

## 2023-03-12 NOTE — Plan of Care (Signed)

## 2023-03-12 NOTE — Telephone Encounter (Signed)
Patient Product/process development scientist completed.    The patient is insured through [facility]. Patient has Medicare and is not eligible for a copay card, but may be able to apply for patient assistance, if available.    Ran test claim for Xarelto 20 mg and the current 30 day co-pay is $0.00.   This test claim was processed through [facility]- copay amounts may vary at other pharmacies due to pharmacy/plan contracts, or as the patient moves through the different stages of their insurance plan.     Roland Earl, CPHT Pharmacy Technician III Certified Patient Advocate [facility] Patient Advocate Team Direct Number: [phone]  Fax: [phone]

## 2023-03-12 NOTE — Discharge Summary (Signed)
Physician Discharge Summary  Suzanne Patton ONG:[id number] DOB: 04/26/1950 DOA: 03/11/2023  PCP: Karenann Cai, NP  Admit date: 03/11/2023 Discharge date: 03/12/2023  Time spent: 46 minutes  Recommendations for Outpatient Follow-up:  Requires CBC Chem-12 1 week Outpatient follow-up if necessary for nonemergent echo-was very hemodynamically stable during hospital stay Consider discussion about hyperglycemia with patient in the outpatient setting Follow-up with Dr. Vassie Loll as well as primary care in the outpatient setting  Discharge Diagnoses:  MAIN problem for hospitalization   Subacute pulmonary embolism  Please see below for itemized issues addressed in HOpsital- refer to other progress notes for clarity if needed  Discharge Condition: Improved  Diet recommendation: Heart healthy  Filed Weights   03/11/23 1446  Weight: 121.3 kg    History of present illness:  72 year old female Jehovah's witness does not take blood Known HTN HLD DM TY 2 DM TY 2 with PCI 2012 --PSVT with ablations performed in [place]-on metoprolol Cardizem and managed by Dr. Rosemary Holms locally Previous admission for abdominal wound status post I&D-was from insulin shots Obstructive sleep apnea diagnosed in 2003 on CPAP   Seen by Dr. Vassie Loll dyspnea 12/18 was started on Lasix for 3 to 5 days because felt to be fluid weight-blood work was performed 12/22 showed BUN/creatinine 24/1.5 hemoglobin 11 In the interim because of her arthritis apparently sleepy and came to ED 12/22-was allegedly she was taking too much of her Norco 7.5 and accidentally took 7-8 came to the ED but woke up and was not severely symptomatic was sent home   she was discharged home in good condition but received a call from the lab--dimer done on 1218 was above 500 --- an outpatient VQ scan was done which was positive for multiple segmental defects in anterior posterior right midlung without chest radiograph correlate suspicious for PE although  unclear chronicity   She was observed overnight on Lovenox and Lovenox was chosen as initial modality given she had no tachycardia no chest pain and had no fever chills or signs of hemodynamic compromised and it was elected to hold off on echocardiogram--will transition her to Xarelto at least for 3 to 6 months given unprovoked nature of her pulmonary embolism which would explain several things At discharge we will try and obtain Dopplers before discharge but that should not prevent her from going home given such stability without oxygen requirement or other issues She will need close follow-up because she is somewhat unsteady on her feet is taking aspirin and will need to be monitored for bleeding and/or anemia She was hemodynamically stable at time of discharge without any other issues and it was felt that she was stable to get her Xarelto in the outpatient setting and follow-up closely    Discharge Exam: Vitals:   03/12/23 0804 03/12/23 0804  BP: (!) 134/58 (!) 134/58  Pulse: (!) 59 (!) 58  Resp: 16 16  Temp: 98.2 F (36.8 C) 98.2 F (36.8 C)  SpO2: 94% 93%    Subj on day of d/c   Awake coherent no distress looks well feels fair sitting up comfortably no chest pain no shortness of breath no fever  General Exam on discharge  EOMI NCAT no focal deficit no icterus no pallor Abdomen soft no rebound Chest clear No JVD ROM intact Neuro is intact  Discharge Instructions   Discharge Instructions     Diet - low sodium heart healthy   Complete by: As directed    Discharge instructions   Complete by: As directed  You were diagnosed with a blood clot on your imaging which was quite nonspecific it does not tell us exactly where the clots are however you will need to be at least on 3 months of anticoagulation I do feel that you will improve over the course of the next several weeks but given the fact that you really did not have a whole lot of symptoms, did not have chest pain and  your heart rate was not elevated feel we can hold off on doing an echo for now I do think that you will need to watch for bleeding as well as for dark stool tarry stool vomiting or nasal bleeding and be careful when moving around on the blood thinner-we would probably use a minimal duration of blood thinner and see how you do Follow-up with your regular doctor or lung doctor in about a week and get lab work   Increase activity slowly   Complete by: As directed    No wound care   Complete by: As directed       Allergies as of 03/12/2023       Reactions   Trulicity [dulaglutide] Shortness Of Breath, Other (See Comments)   Respiratory distress   Ozempic (0.25 Or 0.5 Mg-dose) [semaglutide(0.25 Or 0.5mg -dos)] Nausea And Vomiting   Bonine [meclizine] Other (See Comments)   Psychotic episode   Other Other (See Comments)   A- blood type diet Low fat, Healthy heart   Prednisone Other (See Comments)   Hyperglycemia    Quinolones Other (See Comments)   Myalgias        Medication List     TAKE these medications    albuterol 108 (90 Base) MCG/ACT inhaler Commonly known as: VENTOLIN HFA Inhale 2 puffs into the lungs at bedtime as needed for wheezing or shortness of breath.   aspirin EC 81 MG tablet Take 81 mg by mouth at bedtime.   CALCIUM CITRATE PO Take 4 tablets by mouth at bedtime.   citalopram 40 MG tablet Commonly known as: CELEXA Take 40 mg by mouth daily.   clonazePAM 1 MG tablet Commonly known as: KLONOPIN Take 1 mg by mouth at bedtime.   diclofenac Sodium 1 % Gel Commonly known as: VOLTAREN Apply 2 g topically daily as needed (pain).   diltiazem 120 MG 24 hr capsule Commonly known as: CARDIZEM CD TAKE 1 CAPSULE (120 MG TOTAL) BY MOUTH EVERY MORNING   ezetimibe 10 MG tablet Commonly known as: ZETIA Take 10 mg by mouth daily.   Fish Oil Omega-3 1000 MG Caps Take 1,000 mg by mouth at bedtime.   furosemide 40 MG tablet Commonly known as: LASIX TAKE 1  TABLET BY MOUTH EVERY DAY   Gvoke HypoPen 2-Pack 1 MG/0.2ML Soaj Generic drug: Glucagon Take 1 mg by mouth as needed (Low blood glucose).   HYDROcodone-acetaminophen 7.5-325 MG tablet Commonly known as: NORCO Take 1 tablet by mouth every 6 (six) hours as needed for severe pain.   insulin detemir 100 UNIT/ML injection Commonly known as: LEVEMIR Inject 60 Units into the skin 2 (two) times daily.   insulin lispro 100 UNIT/ML injection Commonly known as: HUMALOG Inject 4-20 Units into the skin See admin instructions. Inject 4-20 units subcutaneously up to 3 times daily before meals   labetalol 300 MG tablet Commonly known as: NORMODYNE Take 300 mg by mouth in the morning and at bedtime.   lamoTRIgine 200 MG tablet Commonly known as: LAMICTAL Take 200 mg by mouth 2 (two) times daily.  levocetirizine 5 MG tablet Commonly known as: XYZAL Take 5 mg by mouth at bedtime.   losartan 25 MG tablet Commonly known as: COZAAR Take 1 tablet (25 mg total) by mouth daily.   methocarbamol 500 MG tablet Commonly known as: ROBAXIN Take 500 mg by mouth daily as needed for muscle spasms.   nitroGLYCERIN 0.4 MG SL tablet Commonly known as: NITROSTAT Place 1 tablet (0.4 mg total) under the tongue every 5 (five) minutes as needed for chest pain.   nortriptyline 10 MG capsule Commonly known as: PAMELOR TAKE ONE OR TWO CAPSULES BY MOUTH AT BEDTIME What changed:  how much to take how to take this when to take this additional instructions   omeprazole 20 MG capsule Commonly known as: PRILOSEC Take 20 mg by mouth every morning.   OneTouch Ultra test strip Generic drug: glucose blood 3 (three) times daily. for testing   Rivaroxaban Starter Pack (15 mg and 20 mg) Commonly known as: XARELTO STARTER PACK Follow package directions: Take one 15mg  tablet by mouth twice a day. On day 22, switch to one 20mg  tablet once a day. Take with food.   rosuvastatin 20 MG tablet Commonly known as:  CRESTOR Take 1 tablet (20 mg total) by mouth daily.   sodium chloride 0.65 % nasal spray Commonly known as: OCEAN Place 2 sprays into the nose at bedtime as needed (nasal dryness). [place]       Allergies  Allergen Reactions   Trulicity [Dulaglutide] Shortness Of Breath and Other (See Comments)    Respiratory distress   Ozempic (0.25 Or 0.5 Mg-Dose) [Semaglutide(0.25 Or 0.5mg -Dos)] Nausea And Vomiting   Bonine [Meclizine] Other (See Comments)    Psychotic episode   Other Other (See Comments)    A- blood type diet  Low fat, Healthy heart   Prednisone Other (See Comments)    Hyperglycemia    Quinolones Other (See Comments)    Myalgias      The results of significant diagnostics from this hospitalization (including imaging, microbiology, ancillary and laboratory) are listed below for reference.    Significant Diagnostic Studies: DG Chest 2 View Result Date: 03/11/2023 CLINICAL DATA:  dyspnea , prior to NM scan EXAM: CHEST - 2 VIEW COMPARISON:  11/02/2022 chest radiograph. FINDINGS: Stable cardiomediastinal silhouette with top-normal heart size. No pneumothorax. No pleural effusion. Lungs appear clear, with no acute consolidative airspace disease and no pulmonary edema. Surgical clips noted in the upper abdomen on the lateral view. IMPRESSION: No active cardiopulmonary disease. Electronically Signed   By: Delbert Phenix M.D.   On: 03/11/2023 13:08   NM Pulmonary Perfusion Result Date: 03/11/2023 CLINICAL DATA:  Inpatient.  Dyspnea. EXAM: NUCLEAR MEDICINE PERFUSION LUNG SCAN TECHNIQUE: Perfusion images were obtained in multiple projections after intravenous injection of radiopharmaceutical. Ventilation scans intentionally deferred if perfusion scan and chest x-ray adequate for interpretation during COVID 19 epidemic. RADIOPHARMACEUTICALS:  4.2 mCi Tc-33m MAA IV COMPARISON:  Chest radiograph from earlier today. FINDINGS: Perfusion images demonstrate multiple segmental defects in the  anterior and posterior right mid lung, without chest radiograph correlate. No definite left lung perfusion defects. IMPRESSION: Multiple segmental defects in the anterior and posterior right mid lung, without chest radiograph correlate, suspicious for pulmonary embolism. These results will be called to the ordering clinician or representative by the Radiologist Assistant, and communication documented in the PACS or [employer]. Electronically Signed   By: Delbert Phenix M.D.   On: 03/11/2023 13:07    Microbiology: No results found for this or  any previous visit (from the past 240 hours).   Labs: Basic Metabolic Panel: Recent Labs  Lab 03/10/23 1039 03/11/23 1806 03/12/23 0539  NA 141 136 139  K 4.2 4.3 4.3  CL 102 101 104  CO2 31 28 29   GLUCOSE 50* 124* 114*  BUN 24* 21 23  CREATININE 1.57* 1.51* 1.73*  CALCIUM 9.2 9.1 8.6*   Liver Function Tests: Recent Labs  Lab 03/10/23 1039 03/11/23 1806 03/12/23 0539  AST 16 17 14*  ALT 13 12 11   ALKPHOS 78 79 67  BILITOT 0.6 0.2 0.5  PROT 6.3* 6.5 5.7*  ALBUMIN 3.5 3.9 3.3*   No results for input(s): "LIPASE", "AMYLASE" in the last 168 hours. No results for input(s): "AMMONIA" in the last 168 hours. CBC: Recent Labs  Lab 03/10/23 1039 03/11/23 1806 03/12/23 0539  WBC 5.9 7.2 5.6  NEUTROABS 3.6 4.6  --   HGB 11.0* 11.1* 10.1*  HCT 36.0 35.2* 32.7*  MCV 88.7 86.3 87.7  PLT 241 233 222   Cardiac Enzymes: No results for input(s): "CKTOTAL", "CKMB", "CKMBINDEX", "TROPONINI" in the last 168 hours. BNP: BNP (last 3 results) No results for input(s): "BNP" in the last 8760 hours.  ProBNP (last 3 results) No results for input(s): "PROBNP" in the last 8760 hours.  CBG: Recent Labs  Lab 03/10/23 1230 03/10/23 1549 03/11/23 1725 03/11/23 2031  GLUCAP 116* 187* 135* 114*    Signed:  Rhetta Mura MD   [facility] 03/12/2023, 8:30 AM

## 2023-03-28 ENCOUNTER — Ambulatory Visit: Payer: 59 | Admitting: Podiatry

## 2023-03-29 ENCOUNTER — Ambulatory Visit: Payer: 59 | Admitting: Cardiology

## 2023-04-04 ENCOUNTER — Ambulatory Visit: Payer: 59 | Admitting: Podiatry

## 2023-04-05 ENCOUNTER — Ambulatory Visit: Payer: 59 | Attending: Cardiology | Admitting: Cardiology

## 2023-04-05 ENCOUNTER — Encounter: Payer: Self-pay | Admitting: Cardiology

## 2023-04-05 VITALS — BP 122/70 | HR 66 | Resp 16 | Ht 62.0 in | Wt 268.8 lb

## 2023-04-05 DIAGNOSIS — I2694 Multiple subsegmental pulmonary emboli without acute cor pulmonale: Secondary | ICD-10-CM | POA: Diagnosis not present

## 2023-04-05 DIAGNOSIS — E782 Mixed hyperlipidemia: Secondary | ICD-10-CM

## 2023-04-05 LAB — LIPID PANEL
Chol/HDL Ratio: 1.9 {ratio} (ref 0.0–4.4)
Cholesterol, Total: 131 mg/dL (ref 100–199)
HDL: 69 mg/dL (ref >39)
LDL Chol Calc (NIH): 45 mg/dL (ref 0–99)
Triglycerides: 89 mg/dL (ref 0–149)
VLDL Cholesterol Cal: 17 mg/dL (ref 5–40)

## 2023-04-05 NOTE — Progress Notes (Signed)
Cardiology Office Note:  .   Date:  04/05/2023  ID:  Suzanne Patton, DOB 03-28-1950, MRN [id number] PCP: Suzanne Hack, MD  [facility] Providers Cardiologist:  Suzanne Mainland, MD PCP: Suzanne Hack, MD  Chief Complaint  Patient presents with   Follow-up    3 months   Coronary Artery Disease      History of Present Illness: .    Suzanne Patton is a 73 y.o. female with hypertension, hyperlipidemia, type 2 DM, CAD, SVT, bipolar disorder, OSA on CPAP, morbid obesity, subacute PE (02/2023)  Patient was hospitalized in 02/2023 with subacute pulmonary embolism.  An outpatient VQ scan that showed multiple segmental defects in anterior posterior right midlung without chest radiograph correlate suspicious for PE, although of unclear chronicity.  She was anticoagulated initially with Lovenox, transition to Xarelto to be continued for at least 3-6 months.    Patient's shortness of breath improved after starting Xarelto.  Last couple days, she has had some bronchitis symptoms, but otherwise doing well.  Vitals:   04/05/23 0839  BP: 122/70  Pulse: 66  Resp: 16  SpO2: 96%     ROS:  Review of Systems  Cardiovascular:  Negative for chest pain, dyspnea on exertion, leg swelling, palpitations and syncope.  Respiratory:  Positive for cough.      Studies Reviewed: Marland Kitchen         Independently interpreted 02/2023: HbA1C 6.4% Hb 10.1 Cr 1.73  02/2022: Chol 163, TG 182, HDL 75, LDL 59  Echocardiogram 03/14/2022:   Normal LV systolic function with visual EF 55-60%. Left ventricle cavity  is normal in size. Mild concentric hypertrophy of the left ventricle.  Normal global wall motion. Doppler evidence of grade I (impaired)  diastolic dysfunction, normal LAP. Calculated EF 60%.  Left atrial cavity is moderately dilated at 42 ml/m^2. An atrial septal  aneurysm with a patent foramen ovale is present.  Structurally normal trileaflet aortic valve.  Mild to moderate aortic   regurgitation.  Structurally normal mitral valve.  Mild (Grade I) mitral regurgitation.  Structurally normal tricuspid valve.  Mild tricuspid regurgitation. No  evidence of pulmonary hypertension.  Compared to 08/2020, AI and MR have improved.   Coronary angiogram 03/2022: LM: Normal LAD: Prox-mid 40% disease         Diag 1 40% disease Lcx: Long diffuse 70% stenosis in prox-mid Lcx        RFR 0.93        70% long disease in OM1 RCA: Patent prox-mid RCA stents with minimal 10% lumen loss   LVEDP 20 mmHg   Obesity and possible diastolic heart f   Physical Exam:   Physical Exam Vitals and nursing note reviewed.  Constitutional:      General: She is not in acute distress. Neck:     Vascular: No JVD.  Cardiovascular:     Rate and Rhythm: Normal rate and regular rhythm.     Heart sounds: Normal heart sounds. No murmur heard. Pulmonary:     Effort: Pulmonary effort is normal.     Breath sounds: Normal breath sounds. No wheezing or rales.  Musculoskeletal:     Right lower leg: No edema.     Left lower leg: No edema.      VISIT DIAGNOSES:   ICD-10-CM   1. Multiple subsegmental pulmonary emboli without acute cor pulmonale (HCC)  I26.94 ECHOCARDIOGRAM COMPLETE    2. Mixed hyperlipidemia  E78.2 Lipid Profile    Lipid Profile  ASSESSMENT AND PLAN: .    Suzanne Patton is a 73 y.o. female with hypertension, hyperlipidemia, type 2 DM, CAD, SVT, bipolar disorder, OSA on CPAP, morbid obesity, subacute PE (02/2023)  Exertional dyspnea: Recent worsening likely due to PE, now improving on Xarelto. Euvolemic on exam. No change made to her medications today.   Given her recent PE diagnosis, I will obtain an echocardiogram.  CAD: No angina symptoms. Nonobstructive coronary artery disease on cath 03/2022. Given his recent addition of Xarelto for PE treatment, have stopped her aspirin. If and when she comes off Xarelto, she should go back on aspirin 81 mg daily then.  PSVT: No  recent recurrence.     Hypertension: Previously resistant, now controlled after addition of labetalol 300 mg twice daily a few months ago. No change made today.    Mixed hyperlipidemia: Well-controlled on Crestor and Zetia. Check lipid panel today.     F/u in 6 months  Signed, Elder Negus, MD

## 2023-04-05 NOTE — Patient Instructions (Signed)
Medication Instructions:   STOP TAKING ASPIRIN NOW  *If you need a refill on your cardiac medications before your next appointment, please call your pharmacy*   Lab Work:  TODAY DOWNSTAIRS FIRST FLOOR AT [facility]  If you have labs (blood work) drawn today and your tests are completely normal, you will receive your results only by: MyChart Message (if you have MyChart) OR A paper copy in the mail If you have any lab test that is abnormal or we need to change your treatment, we will call you to review the results.   Testing/Procedures:  Your physician has requested that you have an echocardiogram. Echocardiography is a painless test that uses sound waves to create images of your heart. It provides your doctor with information about the size and shape of your heart and how well your heart's chambers and valves are working. This procedure takes approximately one hour. There are no restrictions for this procedure. Please do NOT wear cologne, perfume, aftershave, or lotions (deodorant is allowed). Please arrive 15 minutes prior to your appointment time.  Please note: We ask at that you not bring children with you during ultrasound (echo/ vascular) testing. Due to room size and safety concerns, children are not allowed in the ultrasound rooms during exams. Our front office staff cannot provide observation of children in our lobby area while testing is being conducted. An adult accompanying a patient to their appointment will only be allowed in the ultrasound room at the discretion of the ultrasound technician under special circumstances. We apologize for any inconvenience.    Follow-Up: At [facility], you and your health needs are our priority.  As part of our continuing mission to provide you with exceptional heart care, we have created designated Provider Care Teams.  These Care Teams include your primary Cardiologist (physician) and Advanced Practice Providers (APPs -   Physician Assistants and Nurse Practitioners) who all work together to provide you with the care you need, when you need it.  We recommend signing up for the patient portal called "MyChart".  Sign up information is provided on this After Visit Summary.  MyChart is used to connect with patients for Virtual Visits (Telemedicine).  Patients are able to view lab/test results, encounter notes, upcoming appointments, etc.  Non-urgent messages can be sent to your provider as well.   To learn more about what you can do with MyChart, go to [email or web address].    Your next appointment:   6 month(s)  Provider:   DR. Rosemary Holms

## 2023-04-08 ENCOUNTER — Encounter: Payer: Self-pay | Admitting: Cardiology

## 2023-04-09 ENCOUNTER — Other Ambulatory Visit: Payer: Self-pay

## 2023-04-09 ENCOUNTER — Emergency Department (HOSPITAL_COMMUNITY)
Admission: EM | Admit: 2023-04-09 | Discharge: 2023-04-10 | Disposition: A | Discharge status: Home / Self Care | Payer: 59 | Attending: Emergency Medicine | Admitting: Emergency Medicine

## 2023-04-09 ENCOUNTER — Encounter (HOSPITAL_COMMUNITY): Payer: Self-pay | Admitting: Emergency Medicine

## 2023-04-09 ENCOUNTER — Emergency Department (HOSPITAL_COMMUNITY): Payer: 59

## 2023-04-09 DIAGNOSIS — J40 Bronchitis, not specified as acute or chronic: Secondary | ICD-10-CM | POA: Insufficient documentation

## 2023-04-09 DIAGNOSIS — D649 Anemia, unspecified: Secondary | ICD-10-CM | POA: Insufficient documentation

## 2023-04-09 DIAGNOSIS — R0602 Shortness of breath: Secondary | ICD-10-CM | POA: Diagnosis present

## 2023-04-09 DIAGNOSIS — Z7901 Long term (current) use of anticoagulants: Secondary | ICD-10-CM | POA: Insufficient documentation

## 2023-04-09 DIAGNOSIS — Z20822 Contact with and (suspected) exposure to covid-19: Secondary | ICD-10-CM | POA: Insufficient documentation

## 2023-04-09 LAB — RESP PANEL BY RT-PCR (RSV, FLU A&B, COVID)  RVPGX2
Influenza A by PCR: NEGATIVE
Influenza B by PCR: NEGATIVE
Resp Syncytial Virus by PCR: NEGATIVE
SARS Coronavirus 2 by RT PCR: NEGATIVE

## 2023-04-09 LAB — CBC WITH DIFFERENTIAL/PLATELET
Abs Immature Granulocytes: 0.02 10*3/uL (ref 0.00–0.07)
Basophils Absolute: 0 10*3/uL (ref 0.0–0.1)
Basophils Relative: 0 %
Eosinophils Absolute: 0.2 10*3/uL (ref 0.0–0.5)
Eosinophils Relative: 2 %
HCT: 30.3 % — ABNORMAL LOW (ref 36.0–46.0)
Hemoglobin: 9.1 g/dL — ABNORMAL LOW (ref 12.0–15.0)
Immature Granulocytes: 0 %
Lymphocytes Relative: 26 %
Lymphs Abs: 2 10*3/uL (ref 0.7–4.0)
MCH: 26.7 pg (ref 26.0–34.0)
MCHC: 30 g/dL (ref 30.0–36.0)
MCV: 88.9 fL (ref 80.0–100.0)
Monocytes Absolute: 0.6 10*3/uL (ref 0.1–1.0)
Monocytes Relative: 8 %
Neutro Abs: 5 10*3/uL (ref 1.7–7.7)
Neutrophils Relative %: 64 %
Platelets: 270 10*3/uL (ref 150–400)
RBC: 3.41 MIL/uL — ABNORMAL LOW (ref 3.87–5.11)
RDW: 14.6 % (ref 11.5–15.5)
WBC: 7.9 10*3/uL (ref 4.0–10.5)
nRBC: 0 % (ref 0.0–0.2)

## 2023-04-09 LAB — CBG MONITORING, ED: Glucose-Capillary: 204 mg/dL — ABNORMAL HIGH (ref 70–99)

## 2023-04-09 LAB — BASIC METABOLIC PANEL
Anion gap: 9 (ref 5–15)
BUN: 25 mg/dL — ABNORMAL HIGH (ref 8–23)
CO2: 27 mmol/L (ref 22–32)
Calcium: 9 mg/dL (ref 8.9–10.3)
Chloride: 101 mmol/L (ref 98–111)
Creatinine, Ser: 1.65 mg/dL — ABNORMAL HIGH (ref 0.44–1.00)
GFR, Estimated: 33 mL/min — ABNORMAL LOW (ref >60)
Glucose, Bld: 90 mg/dL (ref 70–99)
Potassium: 4.7 mmol/L (ref 3.5–5.1)
Sodium: 137 mmol/L (ref 135–145)

## 2023-04-09 LAB — TROPONIN I (HIGH SENSITIVITY)
Troponin I (High Sensitivity): 7 ng/L (ref <18)
Troponin I (High Sensitivity): 8 ng/L (ref <18)

## 2023-04-09 MED ORDER — ALBUTEROL SULFATE HFA 108 (90 BASE) MCG/ACT IN AERS
2.0000 | INHALATION_SPRAY | RESPIRATORY_TRACT | Status: DC | PRN
  Administered 2023-04-10: 2 via RESPIRATORY_TRACT
  Filled 2023-04-09: qty 6.7

## 2023-04-09 NOTE — ED Provider Triage Note (Signed)
Emergency Medicine Provider Triage Evaluation Note  Suzanne Patton , a 73 y.o. female  was evaluated in triage.  Pt complains of shortness of breath.  Shortness present present for the past 2 weeks she has had a viral bronchitis.  Patient went to her primary care provider few days ago and was diagnosed with a viral sinusitis and given amoxicillin however states she still feels the same.  Patient states that she is coughing up clear sputum but denies any fevers but does endorse nasal congestion.  Patient states if she is sitting down for long period time and stands up she gets a little lightheaded that eventually resolves.  Review of Systems  Positive:  Negative:   Physical Exam  BP (!) 133/52 (BP Location: Left Arm)   Pulse 69   Temp 98.2 F (36.8 C)   Resp (!) 24   Ht 5\' 8"  (1.727 m)   Wt 120.2 kg   SpO2 96%   BMI 40.29 kg/m  Gen:   Awake, no distress   Resp:  Normal effort  MSK:   Moves extremities without difficulty  Other:    Medical Decision Making  Medically screening exam initiated at 5:24 PM.  Appropriate orders placed.  AARNAVI STRODE was informed that the remainder of the evaluation will be completed by another provider, this initial triage assessment does not replace that evaluation, and the importance of remaining in the ED until their evaluation is complete.  Workup initiated, patient stable at this time.   Netta Corrigan, PA-C 04/09/23 1725

## 2023-04-09 NOTE — ED Provider Notes (Signed)
Suzanne Patton EMERGENCY DEPARTMENT AT [facility] Provider Note   CSN: [id number] Arrival date & time: 04/09/23  1647     History {Add pertinent medical, surgical, social history, OB history to HPI:1} Chief Complaint  Patient presents with   Shortness of Breath    Suzanne Patton is a 73 y.o. female.  Patient presents to the emergency department for evaluation of persistent cough and shortness of breath.  She has been sick for approximately 2 weeks.  Her primary care provider has put her on amoxicillin but she has not improved.  Patient reports that for the last 3 days whenever she coughs she gets very dizzy.  She called her doctor today to see if there is something else she could do and was referred to the ED.       Home Medications Prior to Admission medications   Medication Sig Start Date End Date Taking? Authorizing Provider  albuterol (VENTOLIN HFA) 108 (90 Base) MCG/ACT inhaler Inhale 2 puffs into the lungs at bedtime as needed for wheezing or shortness of breath. 08/16/20   [provider]  amoxicillin-clavulanate (AUGMENTIN) 500-125 MG tablet Take 500 mg by mouth 3 (three) times daily. 04/02/23   [provider]  CALCIUM CITRATE PO Take 4 tablets by mouth at bedtime.    [provider]  citalopram (CELEXA) 40 MG tablet Take 40 mg by mouth daily. 03/01/20   [provider]  clonazePAM (KLONOPIN) 1 MG tablet Take 1 mg by mouth at bedtime. 07/10/22   [provider]  diclofenac Sodium (VOLTAREN) 1 % GEL Apply 2 g topically daily as needed (pain).    [provider]  diltiazem (CARDIZEM CD) 120 MG 24 hr capsule TAKE 1 CAPSULE (120 MG TOTAL) BY MOUTH EVERY MORNING 02/07/23   Patwardhan, Manish J, MD  ezetimibe (ZETIA) 10 MG tablet Take 10 mg by mouth daily. 06/23/20   [provider]  furosemide (LASIX) 40 MG tablet TAKE 1 TABLET BY MOUTH EVERY DAY 02/07/23   Patwardhan, Manish J, MD  GVOKE HYPOPEN 2-PACK 1 MG/0.2ML  SOAJ Take 1 mg by mouth as needed (Low blood glucose).    [provider]  HYDROcodone-acetaminophen (NORCO) 7.5-325 MG tablet Take 1 tablet by mouth every 6 (six) hours as needed for severe pain.    [provider]  insulin lispro (HUMALOG) 100 UNIT/ML injection Inject 4-20 Units into the skin See admin instructions. Inject 4-20 units subcutaneously up to 3 times daily before meals 01/16/20   [provider]  labetalol (NORMODYNE) 300 MG tablet Take 300 mg by mouth in the morning and at bedtime.    [provider]  lamoTRIgine (LAMICTAL) 200 MG tablet Take 200 mg by mouth 2 (two) times daily. 06/27/20   [provider]  LANTUS 100 UNIT/ML injection Inject 60 Units into the skin 2 (two) times daily. 04/03/23   [provider]  levocetirizine (XYZAL) 5 MG tablet Take 5 mg by mouth at bedtime.    [provider]  losartan (COZAAR) 25 MG tablet Take 1 tablet (25 mg total) by mouth daily. 02/07/23 08/06/23  Patwardhan, Anabel Bene, MD  methocarbamol (ROBAXIN) 500 MG tablet Take 500 mg by mouth daily as needed for muscle spasms. 08/01/22   [provider]  nitroGLYCERIN (NITROSTAT) 0.4 MG SL tablet Place 1 tablet (0.4 mg total) under the tongue every 5 (five) minutes as needed for chest pain. 12/18/22   Patwardhan, Anabel Bene, MD  nortriptyline (PAMELOR) 10 MG capsule  TAKE ONE OR TWO CAPSULES BY MOUTH AT BEDTIME Patient taking differently: Take 20 mg by mouth at bedtime. 02/06/23   Sater, Pearletha Furl, MD  Omega-3 Fatty Acids (FISH OIL OMEGA-3) 1000 MG CAPS Take 1,000 mg by mouth at bedtime.    [provider]  omeprazole (PRILOSEC) 20 MG capsule Take 20 mg by mouth every morning. 06/23/20   [provider]  [facility] ULTRA test strip 3 (three) times daily. for testing 07/27/22   [provider]  RIVAROXABAN Carlena Hurl) VTE STARTER PACK (15 & 20 MG) Follow package directions: Take one 15mg  tablet by mouth twice a day. On day  22, switch to one 20mg  tablet once a day. Take with food. 03/12/23 06/10/23  Rhetta Mura, MD  rosuvastatin (CRESTOR) 20 MG tablet Take 1 tablet (20 mg total) by mouth daily. 08/10/22 04/27/23  Patwardhan, Anabel Bene, MD  sodium chloride (OCEAN) 0.65 % nasal spray Place 2 sprays into the nose at bedtime as needed (nasal dryness). [place]    [provider]      Allergies    Trulicity [dulaglutide], Ozempic (0.25 or 0.5 mg-dose) [semaglutide(0.25 or 0.5mg -dos)], Bonine [meclizine], Other, Prednisone, and Quinolones    Review of Systems   Review of Systems  Physical Exam Updated Vital Signs BP (!) 149/74 (BP Location: Left Arm)   Pulse 65   Temp 98.4 F (36.9 C) (Oral)   Resp 18   Ht 5\' 8"  (1.727 m)   Wt 120.2 kg   SpO2 95%   BMI 40.29 kg/m  Physical Exam Vitals and nursing note reviewed.  Constitutional:      General: She is not in acute distress.    Appearance: She is well-developed.  HENT:     Head: Normocephalic and atraumatic.     Mouth/Throat:     Mouth: Mucous membranes are moist.  Eyes:     General: Vision grossly intact. Gaze aligned appropriately.     Extraocular Movements: Extraocular movements intact.     Conjunctiva/sclera: Conjunctivae normal.  Cardiovascular:     Rate and Rhythm: Normal rate and regular rhythm.     Pulses: Normal pulses.     Heart sounds: Normal heart sounds, S1 normal and S2 normal. No murmur heard.    No friction rub. No gallop.  Pulmonary:     Effort: Pulmonary effort is normal. No respiratory distress.     Breath sounds: Normal breath sounds.  Abdominal:     General: Bowel sounds are normal.     Palpations: Abdomen is soft.     Tenderness: There is no abdominal tenderness. There is no guarding or rebound.     Hernia: No hernia is present.  Musculoskeletal:        General: No swelling.     Cervical back: Full passive range of motion without pain, normal range of motion and neck supple. No spinous process tenderness or muscular  tenderness. Normal range of motion.     Right lower leg: No edema.     Left lower leg: No edema.  Skin:    General: Skin is warm and dry.     Capillary Refill: Capillary refill takes less than 2 seconds.     Findings: No ecchymosis, erythema, rash or wound.  Neurological:     General: No focal deficit present.     Mental Status: She is alert and oriented to person, place, and time.     GCS: GCS eye subscore is 4. GCS verbal subscore is 5. GCS motor subscore  is 6.     Cranial Nerves: Cranial nerves 2-12 are intact.     Sensory: Sensation is intact.     Motor: Motor function is intact.     Coordination: Coordination is intact.  Psychiatric:        Attention and Perception: Attention normal.        Mood and Affect: Mood normal.        Speech: Speech normal.        Behavior: Behavior normal.     ED Results / Procedures / Treatments   Labs (all labs ordered are listed, but only abnormal results are displayed) Labs Reviewed  BASIC METABOLIC PANEL - Abnormal; Notable for the following components:      Result Value   BUN 25 (*)    Creatinine, Ser 1.65 (*)    GFR, Estimated 33 (*)    All other components within normal limits  CBC WITH DIFFERENTIAL/PLATELET - Abnormal; Notable for the following components:   RBC 3.41 (*)    Hemoglobin 9.1 (*)    HCT 30.3 (*)    All other components within normal limits  CBG MONITORING, ED - Abnormal; Notable for the following components:   Glucose-Capillary 204 (*)    All other components within normal limits  RESP PANEL BY RT-PCR (RSV, FLU A&B, COVID)  [id number]  TROPONIN I (HIGH SENSITIVITY)  TROPONIN I (HIGH SENSITIVITY)    EKG EKG Interpretation Date/Time:  Tuesday April 09 2023 17:16:35 EST Ventricular Rate:  67 PR Interval:  192 QRS Duration:  92 QT Interval:  456 QTC Calculation: 481 R Axis:   14  Text Interpretation: Normal sinus rhythm Cannot rule out Anterior infarct , age undetermined Abnormal ECG When compared with ECG of  10-Mar-2023 12:19, No significant change since last tracing Confirmed by Gilda Crease [phone]) on 04/09/2023 11:20:45 PM  Radiology DG Chest 2 View Result Date: 04/09/2023 CLINICAL DATA:  Short of breath, productive cough EXAM: CHEST - 2 VIEW COMPARISON:  03/11/2023 FINDINGS: Frontal and lateral views of the chest demonstrate an unremarkable cardiac silhouette. No acute airspace disease, effusion, or pneumothorax. No acute bony abnormalities. IMPRESSION: 1. No acute intrathoracic process. Electronically Signed   By: Sharlet Salina M.D.   On: 04/09/2023 17:58    Procedures Procedures  {Document cardiac monitor, telemetry assessment procedure when appropriate:1}  Medications Ordered in ED Medications  albuterol (VENTOLIN HFA) 108 (90 Base) MCG/ACT inhaler 2 puff (has no administration in time range)    ED Course/ Medical Decision Making/ A&P   {   Click here for ABCD2, HEART and other calculatorsREFRESH Note before signing :1}                              Medical Decision Making Risk Prescription drug management.   Differential Diagnosis considered includes, but not limited to: COVID-19; influenza; RSV; simple viral URI; pneumonia  Presents with persistent cough, now with dizziness when she coughs.  Patient on amoxicillin.  EKG unremarkable, troponins negative.  Chest x-ray clear, no evidence of pneumonia.  No signs of decompensated congestive heart failure.  Review of records reveals patient was recently diagnosed with PE.  She is on Xarelto.  She has not missed any doses.  Doubt increasing clot burden.  She does, however, have some anemia.  It appears that her hemoglobin was around 11 in the past, however when she was hospitalized it was 10.1.  Hemoglobin is 9.1 today.  Hemoccult {  Document critical care time when appropriate:1} {Document review of labs and clinical decision tools ie heart score, Chads2Vasc2 etc:1}  {Document your independent review of radiology images, and  any outside records:1} {Document your discussion with family members, caretakers, and with consultants:1} {Document social determinants of health affecting pt's care:1} {Document your decision making why or why not admission, treatments were needed:1} Final Clinical Impression(s) / ED Diagnoses Final diagnoses:  None    Rx / DC Orders ED Discharge Orders     None

## 2023-04-09 NOTE — ED Triage Notes (Signed)
Pt here for ShOB, states 2 weeks ago she had a viral bronchitis. Reports upper chest tightness, continued nasal congestion, dizziness, and light headedness when she coughs that started Saturday. States she has been on Amoxicillin since Wednesday.

## 2023-04-10 LAB — POC OCCULT BLOOD, ED: Fecal Occult Bld: NEGATIVE

## 2023-04-11 ENCOUNTER — Encounter (HOSPITAL_BASED_OUTPATIENT_CLINIC_OR_DEPARTMENT_OTHER): Payer: Self-pay | Admitting: Pulmonary Disease

## 2023-04-11 ENCOUNTER — Ambulatory Visit (HOSPITAL_BASED_OUTPATIENT_CLINIC_OR_DEPARTMENT_OTHER): Payer: 59 | Admitting: Pulmonary Disease

## 2023-04-11 VITALS — BP 116/58 | HR 64 | Resp 14 | Ht 68.0 in | Wt 268.8 lb

## 2023-04-11 DIAGNOSIS — I2694 Multiple subsegmental pulmonary emboli without acute cor pulmonale: Secondary | ICD-10-CM | POA: Diagnosis not present

## 2023-04-11 NOTE — Patient Instructions (Addendum)
Multiple segmental pulmonary emboli RLE DVT Continue Xarelto 20 mg daily for 3-6 months. Started on 03/10/23 Encourage daily ambulation. If remains sedentary may need extended anticoagulation course

## 2023-04-11 NOTE — Progress Notes (Signed)
Subjective:   PATIENT ID: Suzanne Patton GENDER: female DOB: [birth year], MRN: [id number]  Chief Complaint  Patient presents with   Hospitalization Follow-up    Blood clots-tired, has bronchitis    Reason for Visit: Hospital follow-up. Dr. Vassie Loll patient  [name] is a 73 year old female never smoker with HTN, HLD, DM2, SVT, bipolar, OSA on CPAP who presents for hospital follow-up for recent subacute pulmonary embolism.  She was seen by Dr. Vassie Loll on 12/18 and being worked up for shortness of breath. Due to elevated D-dimer she had V/Q scan completed. She hospitalized from 03/11/23-03/12/23 for subacute pulmonary embolism diagnosed via V/Q scan demonstrating multiple segmental defect in right midlung. She was advised by Dr. Vassie Loll to present to ED evaluation and treatment with anticoagulation. LE doppler consistent with DVT in right femoral vein, right posterior tibial veins, and right peroneals. She was initially started on Lovenox and transitioned to Xarelto.   Since then she was seen in the ED 04/09/23 for bronchitis with symptoms of cough and shortness of breath x 2 weeks. RVP negative. Treated with amoxicillin by PCP without improvement. Trop neg x 2. Dizziness with cough thought to be vasovagal. Since then cough has been clearing with productive cough. Declined steroids due to DM2.  She was seen by cardiology on 04/05/23 for exertional dyspnea thought likely related to her PE. No medication changes but echo ordered. Denies personal or family history of blood clots.She is sedentary at baseline  I have personally reviewed patient's past medical/family/social history, allergies, current medications.  Past Medical History:  Diagnosis Date   Blood transfusion declined because patient is Jehovah's Witness    CAD (coronary artery disease)    Complication of anesthesia    Degenerative joint disease    Diabetes mellitus without complication (HCC)    Fibromyalgia    Hyperlipidemia     Hypertension    Neuromuscular disorder (HCC)    Osteoarthritis    PVD (peripheral vascular disease) (HCC)    s/p carotid stent   Sleep apnea    Stage 3b chronic kidney disease (CKD) (HCC)      Family History  Problem Relation Age of Onset   Atrial fibrillation Mother    Kidney failure Mother    Heart disease Father    Heart attack Father    Heart failure Father    COPD Father    Gallbladder disease Brother        gangrene     Social History   Occupational History   Occupation: retired Surveyor, quantity  Tobacco Use   Smoking status: Never    Passive exposure: Current   Smokeless tobacco: Never  Vaping Use   Vaping status: Never Used  Substance and Sexual Activity   Alcohol use: Yes    Comment: rare   Drug use: Never   Sexual activity: Not on file    Allergies  Allergen Reactions   Trulicity [Dulaglutide] Shortness Of Breath and Other (See Comments)    Respiratory distress   Ozempic (0.25 Or 0.5 Mg-Dose) [Semaglutide(0.25 Or 0.5mg -Dos)] Nausea And Vomiting   Bonine [Meclizine] Other (See Comments)    Psychotic episode   Other Other (See Comments)    A- blood type diet  Low fat, Healthy heart   Prednisone Other (See Comments)    Hyperglycemia    Quinolones Other (See Comments)    Myalgias     Outpatient Medications Prior to Visit  Medication Sig Dispense Refill   albuterol (VENTOLIN HFA)  108 (90 Base) MCG/ACT inhaler Inhale 2 puffs into the lungs at bedtime as needed for wheezing or shortness of breath.     amoxicillin-clavulanate (AUGMENTIN) 500-125 MG tablet Take 500 mg by mouth 3 (three) times daily.     CALCIUM CITRATE PO Take 4 tablets by mouth at bedtime.     citalopram (CELEXA) 40 MG tablet Take 40 mg by mouth daily.     clonazePAM (KLONOPIN) 1 MG tablet Take 1 mg by mouth at bedtime.     diclofenac Sodium (VOLTAREN) 1 % GEL Apply 2 g topically daily as needed (pain).     diltiazem (CARDIZEM CD) 120 MG 24 hr capsule TAKE 1 CAPSULE (120 MG TOTAL) BY  MOUTH EVERY MORNING 90 capsule 3   ezetimibe (ZETIA) 10 MG tablet Take 10 mg by mouth daily.     furosemide (LASIX) 40 MG tablet TAKE 1 TABLET BY MOUTH EVERY DAY 90 tablet 3   GVOKE HYPOPEN 2-PACK 1 MG/0.2ML SOAJ Take 1 mg by mouth as needed (Low blood glucose).     HYDROcodone-acetaminophen (NORCO) 7.5-325 MG tablet Take 1 tablet by mouth every 6 (six) hours as needed for severe pain.     insulin lispro (HUMALOG) 100 UNIT/ML injection Inject 4-20 Units into the skin See admin instructions. Inject 4-20 units subcutaneously up to 3 times daily before meals     labetalol (NORMODYNE) 300 MG tablet Take 300 mg by mouth in the morning and at bedtime.     lamoTRIgine (LAMICTAL) 200 MG tablet Take 200 mg by mouth 2 (two) times daily.     LANTUS 100 UNIT/ML injection Inject 60 Units into the skin 2 (two) times daily.     levocetirizine (XYZAL) 5 MG tablet Take 5 mg by mouth at bedtime.     losartan (COZAAR) 25 MG tablet Take 1 tablet (25 mg total) by mouth daily. 90 tablet 3   methocarbamol (ROBAXIN) 500 MG tablet Take 500 mg by mouth daily as needed for muscle spasms.     nitroGLYCERIN (NITROSTAT) 0.4 MG SL tablet Place 1 tablet (0.4 mg total) under the tongue every 5 (five) minutes as needed for chest pain. 25 tablet 3   nortriptyline (PAMELOR) 10 MG capsule TAKE ONE OR TWO CAPSULES BY MOUTH AT BEDTIME (Patient taking differently: Take 20 mg by mouth at bedtime.) 60 capsule 3   omeprazole (PRILOSEC) 20 MG capsule Take 20 mg by mouth every morning.     ONETOUCH ULTRA test strip 3 (three) times daily. for testing     rivaroxaban (XARELTO) 20 MG TABS tablet Take 20 mg by mouth daily with supper.     rosuvastatin (CRESTOR) 20 MG tablet Take 1 tablet (20 mg total) by mouth daily. 90 tablet 3   sodium chloride (OCEAN) 0.65 % nasal spray Place 2 sprays into the nose at bedtime as needed (nasal dryness). [place]     Omega-3 Fatty Acids (FISH OIL OMEGA-3) 1000 MG CAPS Take 1,000 mg by mouth at bedtime.      RIVAROXABAN (XARELTO) VTE STARTER PACK (15 & 20 MG) Follow package directions: Take one 15mg  tablet by mouth twice a day. On day 22, switch to one 20mg  tablet once a day. Take with food. 51 each 1   No facility-administered medications prior to visit.    Review of Systems  Constitutional:  Negative for chills, diaphoresis, fever, malaise/fatigue and weight loss.  HENT:  Negative for congestion.   Respiratory:  Positive for cough. Negative for hemoptysis, sputum production, shortness of  breath and wheezing.   Cardiovascular:  Negative for chest pain, palpitations and leg swelling.     Objective:   Vitals:   04/11/23 1112  BP: (!) 116/58  Pulse: 64  Resp: 14  SpO2: 97%  Weight: 268 lb 12.8 oz (121.9 kg)  Height: 5\' 8"  (1.727 m)   SpO2: 97 %  Physical Exam: General: Well-appearing, no acute distress HENT: [place], AT Eyes: EOMI, no scleral icterus Respiratory: Clear to auscultation bilaterally.  No crackles, wheezing or rales Cardiovascular: RRR, -M/R/G, no JVD Extremities:-Edema,-tenderness Neuro: AAO x4, CNII-XII grossly intact Psych: Normal mood, normal affect  Data Reviewed:  Imaging: V/Q 03/11/23 - multiple segmental defects in anterior and posterior right mid lung concerning for PE CXR 04/09/23 - No infiltrate effusion or edema  PFT: 04/23/22 - Normal      Assessment & Plan:   Discussion: 73 year old female with HTN, HLD, DM2, SVT, bipolar, OSA on CPAP who presents for hospital follow-up for recent subacute pulmonary embolism. Hospital course reviewed. No personal or family hx of blood clots. No recent surgeries or procedures. No meds to provoke clots. Sedentary lifestyle so may be related to immobility. She reports 90 day supply of Xarelto.    Multiple segmental pulmonary emboli RLE DVT Continue Xarelto 20 mg daily for 3-6 months. Started on 03/10/23 Encourage daily ambulation. If remains sedentary may need extended anticoagulation course  Viral  bronchitis Improving OK to stop antibiotics. No infiltrate on CXR Patient declined additional steroids  Health Maintenance Immunization History  Administered Date(s) Administered   Influenza-Unspecified 01/14/2023   No orders of the defined types were placed in this encounter. No orders of the defined types were placed in this encounter.   Return in about 2 months (around 06/09/2023) for with Dr. Vassie Loll in 2 months.  I have spent a total time of 30-minutes on the day of the appointment reviewing prior documentation, coordinating care and discussing medical diagnosis and plan with the patient/family. Imaging, labs and tests included in this note have been reviewed and interpreted independently by me.  Jadon Ressler Mechele Collin, MD [facility] 04/11/2023 11:45 AM

## 2023-04-15 ENCOUNTER — Other Ambulatory Visit: Payer: Self-pay | Admitting: Orthopedic Surgery

## 2023-04-15 DIAGNOSIS — R0789 Other chest pain: Secondary | ICD-10-CM

## 2023-04-26 ENCOUNTER — Encounter (HOSPITAL_COMMUNITY): Payer: Self-pay

## 2023-04-26 ENCOUNTER — Ambulatory Visit (HOSPITAL_COMMUNITY): Payer: 59

## 2023-05-07 ENCOUNTER — Inpatient Hospital Stay (HOSPITAL_BASED_OUTPATIENT_CLINIC_OR_DEPARTMENT_OTHER): Payer: 59 | Admitting: Pulmonary Disease

## 2023-05-13 ENCOUNTER — Other Ambulatory Visit: Payer: 59

## 2023-05-18 DEATH — deceased

## 2023-06-11 ENCOUNTER — Ambulatory Visit (HOSPITAL_BASED_OUTPATIENT_CLINIC_OR_DEPARTMENT_OTHER): Payer: 59 | Admitting: Primary Care

## 2023-08-15 IMAGING — DX DG SHOULDER 2+V*L*
3 series · 3 of 3 positions shown · non-contrast
Comparison: None.

CLINICAL DATA: Left shoulder pain after fall.

EXAM:
LEFT SHOULDER - 2+ VIEW

[shoulder grashey]
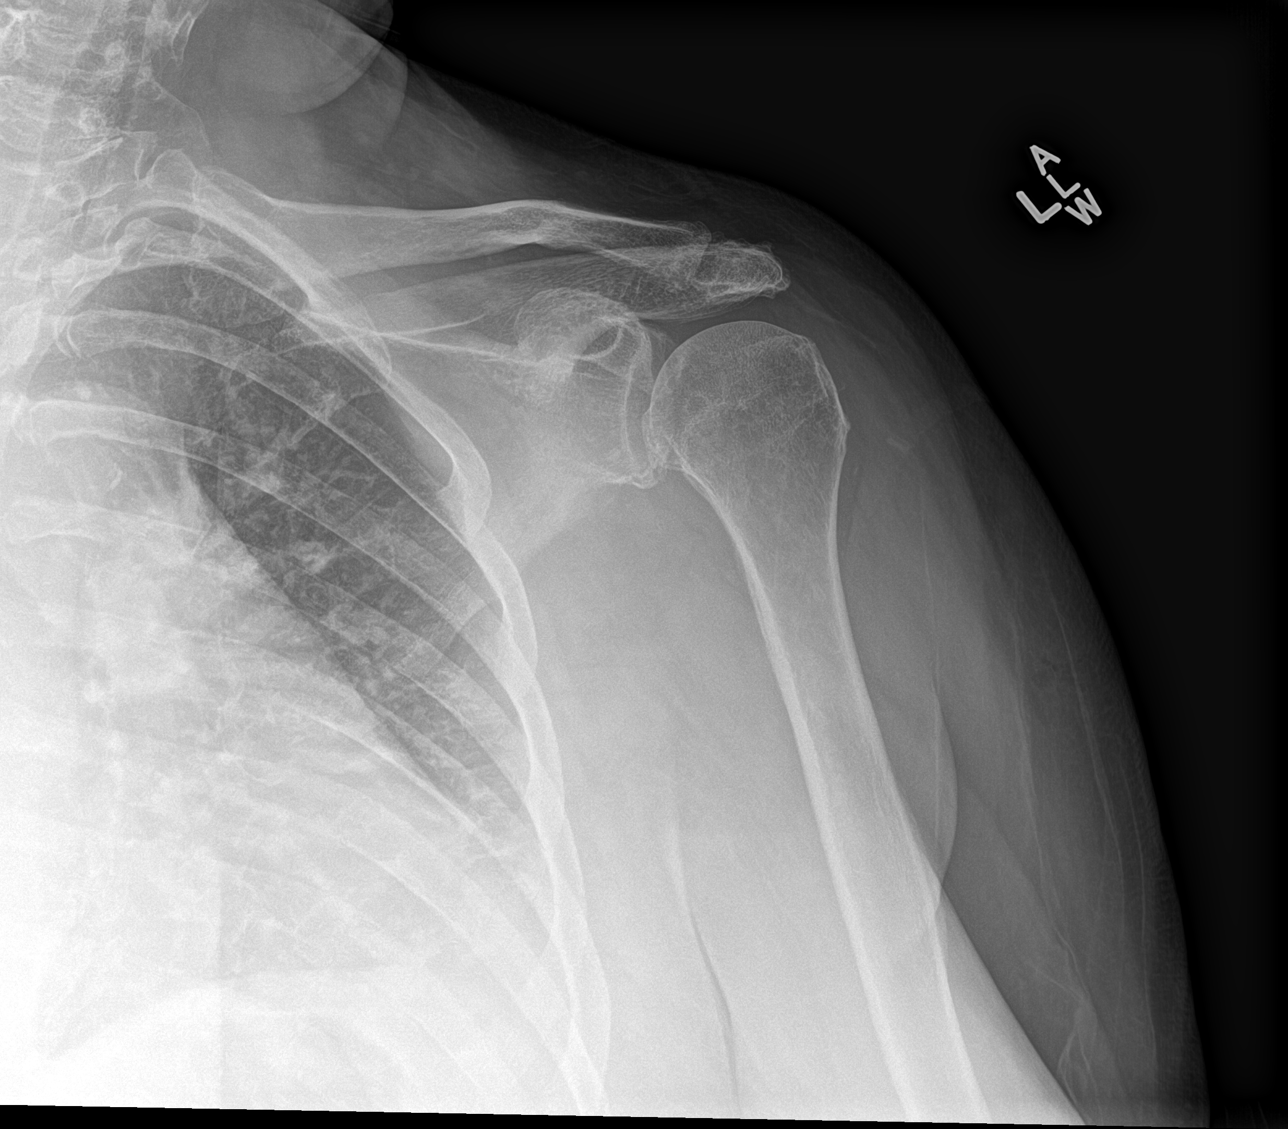

[shoulder y view]
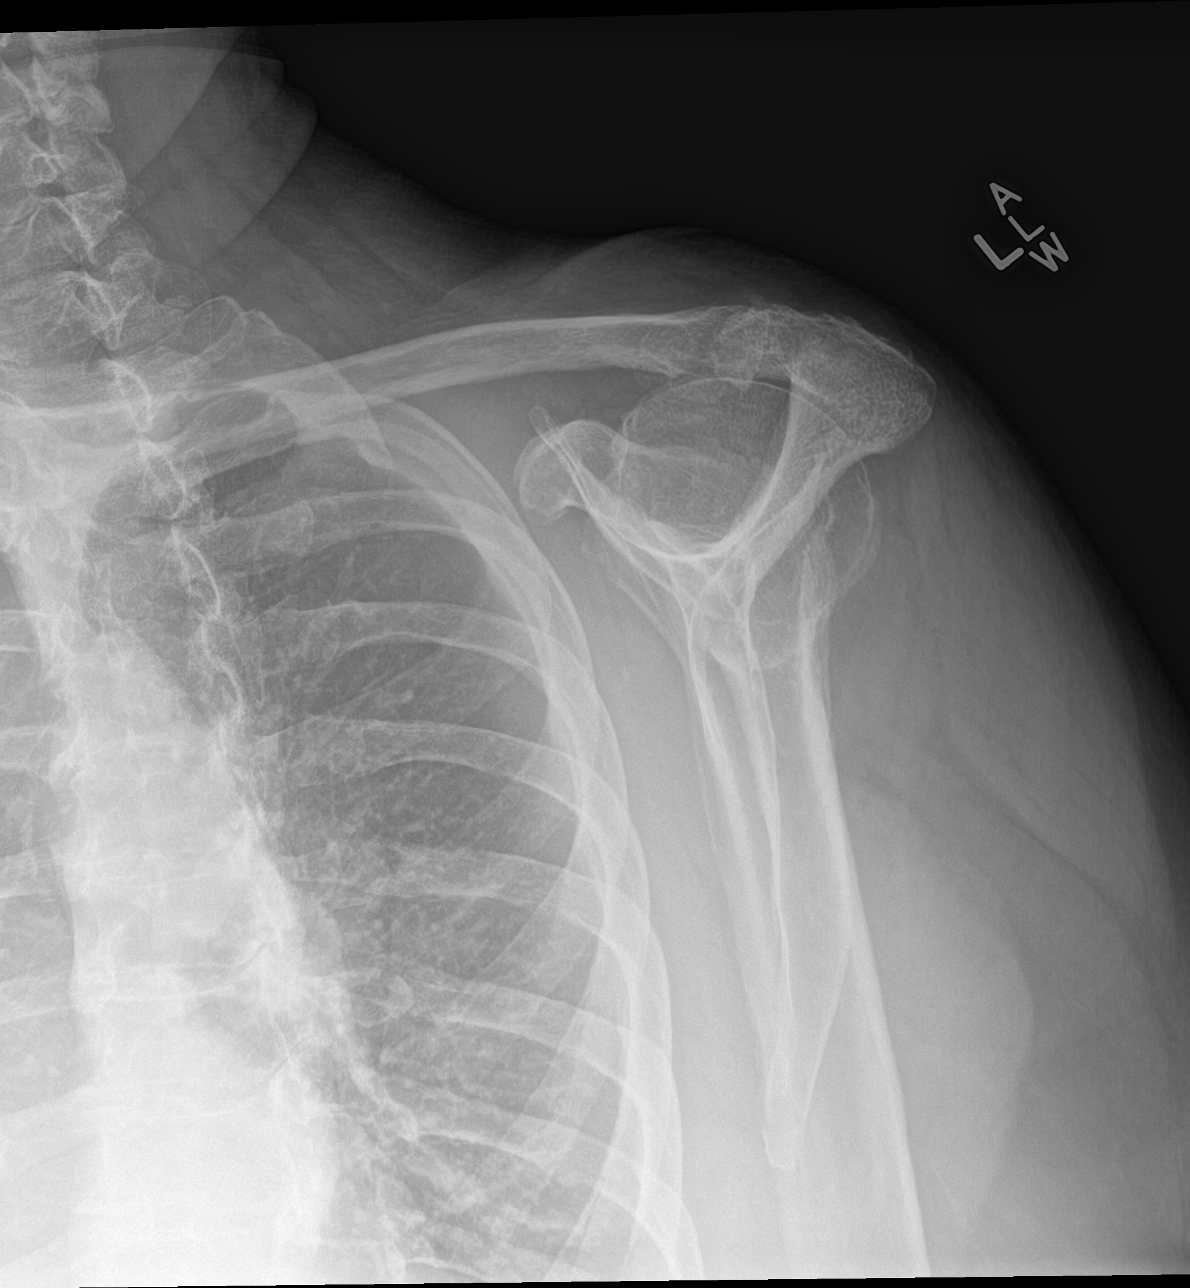

[shoulder axillary]
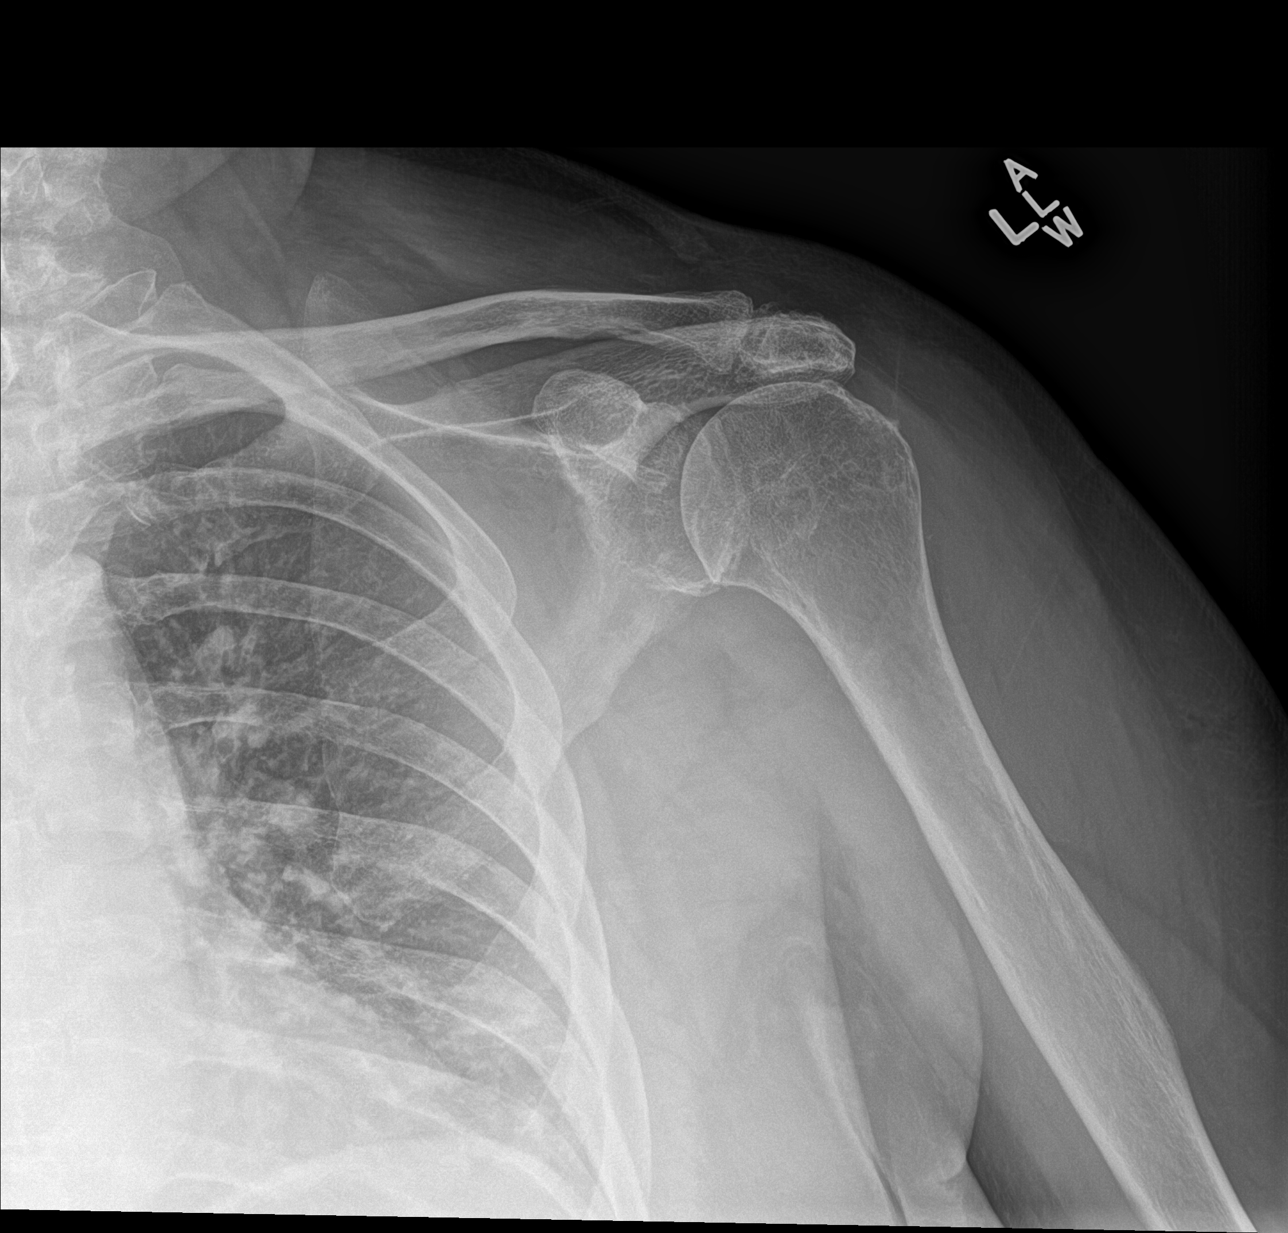

[3 of 3 positions shown; findings below may reference images not displayed]

FINDINGS: No acute fracture or dislocation. Mild glenohumeral and moderate
acromioclavicular degenerative changes. Soft tissues are
unremarkable.
IMPRESSION: 1. No acute osseous abnormality.
